# Patient Record
Sex: Male | Born: 1970 | Race: Black or African American | Hispanic: No | Marital: Married | Smoking: Current every day smoker
Health system: Southern US, Community
[De-identification: ages and names within clinical notes are randomized; demographics above are authoritative.]

## PROBLEM LIST (undated history)

## (undated) DIAGNOSIS — I10 Essential (primary) hypertension: Secondary | ICD-10-CM

## (undated) DIAGNOSIS — G0490 Encephalitis and encephalomyelitis, unspecified: Secondary | ICD-10-CM

## (undated) DIAGNOSIS — I219 Acute myocardial infarction, unspecified: Secondary | ICD-10-CM

## (undated) HISTORY — PX: CORONARY ANGIOPLASTY WITH STENT PLACEMENT: SHX49

---

## 2010-05-02 ENCOUNTER — Inpatient Hospital Stay: Payer: Self-pay | Admitting: Internal Medicine

## 2010-05-04 ENCOUNTER — Emergency Department: Payer: Self-pay | Admitting: Emergency Medicine

## 2011-03-23 ENCOUNTER — Emergency Department: Payer: Self-pay | Admitting: Emergency Medicine

## 2013-06-21 ENCOUNTER — Emergency Department: Payer: Self-pay | Admitting: Emergency Medicine

## 2013-06-21 LAB — PROTIME-INR
INR: 1
Prothrombin Time: 13.4 secs (ref 11.5–14.7)

## 2013-06-21 LAB — COMPREHENSIVE METABOLIC PANEL
Alkaline Phosphatase: 104 U/L (ref 50–136)
BUN: 17 mg/dL (ref 7–18)
Co2: 22 mmol/L (ref 21–32)
Creatinine: 1.33 mg/dL — ABNORMAL HIGH (ref 0.60–1.30)
EGFR (African American): >60
EGFR (Non-African Amer.): >60
Osmolality: 281 (ref 275–301)
SGPT (ALT): 24 U/L (ref 12–78)
Sodium: 139 mmol/L (ref 136–145)
Total Protein: 7.1 g/dL (ref 6.4–8.2)

## 2013-06-21 LAB — CBC
HCT: 40.5 % (ref 40.0–52.0)
Platelet: 340 10*3/uL (ref 150–440)
RBC: 4.4 10*6/uL (ref 4.40–5.90)
RDW: 13 % (ref 11.5–14.5)
WBC: 11.3 10*3/uL — ABNORMAL HIGH (ref 3.8–10.6)

## 2013-06-21 LAB — APTT: Activated PTT: 28.6 secs (ref 23.6–35.9)

## 2013-06-21 LAB — CK TOTAL AND CKMB (NOT AT ARMC)
CK, Total: 317 U/L — ABNORMAL HIGH (ref 35–232)
CK-MB: 4.4 ng/mL — ABNORMAL HIGH (ref 0.5–3.6)

## 2013-06-21 LAB — TROPONIN I: Troponin-I: 0.98 ng/mL — ABNORMAL HIGH

## 2013-06-22 DIAGNOSIS — I2510 Atherosclerotic heart disease of native coronary artery without angina pectoris: Secondary | ICD-10-CM | POA: Insufficient documentation

## 2014-01-27 DIAGNOSIS — I429 Cardiomyopathy, unspecified: Secondary | ICD-10-CM | POA: Insufficient documentation

## 2014-01-27 DIAGNOSIS — I428 Other cardiomyopathies: Secondary | ICD-10-CM | POA: Insufficient documentation

## 2014-03-07 ENCOUNTER — Observation Stay: Payer: Self-pay | Admitting: Internal Medicine

## 2014-03-07 LAB — URINALYSIS, COMPLETE
BILIRUBIN, UR: NEGATIVE
BLOOD: NEGATIVE
Bacteria: NONE SEEN
GLUCOSE, UR: NEGATIVE mg/dL (ref 0–75)
KETONE: NEGATIVE
Nitrite: NEGATIVE
PH: 5 (ref 4.5–8.0)
PROTEIN: NEGATIVE
RBC,UR: 1 /HPF (ref 0–5)
SPECIFIC GRAVITY: 1.013 (ref 1.003–1.030)
Squamous Epithelial: 2

## 2014-03-07 LAB — DRUG SCREEN, URINE
Amphetamines, Ur Screen: NEGATIVE (ref ?–1000)
BARBITURATES, UR SCREEN: NEGATIVE (ref ?–200)
Benzodiazepine, Ur Scrn: NEGATIVE (ref ?–200)
Cannabinoid 50 Ng, Ur ~~LOC~~: POSITIVE (ref ?–50)
Cocaine Metabolite,Ur ~~LOC~~: NEGATIVE (ref ?–300)
MDMA (Ecstasy)Ur Screen: NEGATIVE (ref ?–500)
Methadone, Ur Screen: NEGATIVE (ref ?–300)
OPIATE, UR SCREEN: NEGATIVE (ref ?–300)
Phencyclidine (PCP) Ur S: NEGATIVE (ref ?–25)
Tricyclic, Ur Screen: NEGATIVE (ref ?–1000)

## 2014-03-07 LAB — CBC
HCT: 37 % — ABNORMAL LOW (ref 40.0–52.0)
HGB: 12.6 g/dL — AB (ref 13.0–18.0)
MCH: 31.7 pg (ref 26.0–34.0)
MCHC: 34 g/dL (ref 32.0–36.0)
MCV: 94 fL (ref 80–100)
PLATELETS: 215 10*3/uL (ref 150–440)
RBC: 3.96 10*6/uL — ABNORMAL LOW (ref 4.40–5.90)
RDW: 13.2 % (ref 11.5–14.5)
WBC: 8.9 10*3/uL (ref 3.8–10.6)

## 2014-03-07 LAB — COMPREHENSIVE METABOLIC PANEL
ALBUMIN: 3.5 g/dL (ref 3.4–5.0)
ALT: 22 U/L (ref 12–78)
Alkaline Phosphatase: 93 U/L
Anion Gap: 11 (ref 7–16)
BUN: 10 mg/dL (ref 7–18)
Bilirubin,Total: 0.2 mg/dL (ref 0.2–1.0)
CHLORIDE: 112 mmol/L — AB (ref 98–107)
Calcium, Total: 8.4 mg/dL — ABNORMAL LOW (ref 8.5–10.1)
Co2: 19 mmol/L — ABNORMAL LOW (ref 21–32)
Creatinine: 1.28 mg/dL (ref 0.60–1.30)
EGFR (African American): >60
EGFR (Non-African Amer.): >60
Glucose: 124 mg/dL — ABNORMAL HIGH (ref 65–99)
Osmolality: 284 (ref 275–301)
Potassium: 3.1 mmol/L — ABNORMAL LOW (ref 3.5–5.1)
SGOT(AST): 14 U/L — ABNORMAL LOW (ref 15–37)
Sodium: 142 mmol/L (ref 136–145)
TOTAL PROTEIN: 6.9 g/dL (ref 6.4–8.2)

## 2014-03-07 LAB — TROPONIN I
TROPONIN-I: 0.04 ng/mL
TROPONIN-I: 0.04 ng/mL
Troponin-I: 0.05 ng/mL

## 2014-03-07 LAB — LIPID PANEL
Cholesterol: 161 mg/dL (ref 0–200)
HDL Cholesterol: 28 mg/dL — ABNORMAL LOW (ref 40–60)
Ldl Cholesterol, Calc: 102 mg/dL — ABNORMAL HIGH (ref 0–100)
TRIGLYCERIDES: 157 mg/dL (ref 0–200)
VLDL CHOLESTEROL, CALC: 31 mg/dL (ref 5–40)

## 2014-03-07 LAB — CK-MB
CK-MB: 4.1 ng/mL — AB (ref 0.5–3.6)
CK-MB: 4.2 ng/mL — AB (ref 0.5–3.6)
CK-MB: 3.9 ng/mL — AB (ref 0.5–3.6)

## 2014-04-29 ENCOUNTER — Emergency Department: Payer: Self-pay | Admitting: Emergency Medicine

## 2014-07-21 ENCOUNTER — Emergency Department: Payer: Self-pay | Admitting: Emergency Medicine

## 2014-08-02 ENCOUNTER — Inpatient Hospital Stay: Payer: Self-pay | Admitting: Internal Medicine

## 2014-08-02 LAB — URINALYSIS, COMPLETE
BILIRUBIN, UR: NEGATIVE
Bacteria: NONE SEEN
Blood: NEGATIVE
Glucose,UR: NEGATIVE mg/dL (ref 0–75)
Ketone: NEGATIVE
Leukocyte Esterase: NEGATIVE
Nitrite: NEGATIVE
PH: 5 (ref 4.5–8.0)
Protein: NEGATIVE
RBC,UR: <1 /HPF (ref 0–5)
SPECIFIC GRAVITY: 1.02 (ref 1.003–1.030)
SQUAMOUS EPITHELIAL: NONE SEEN
WBC UR: 3 /HPF (ref 0–5)

## 2014-08-02 LAB — DRUG SCREEN, URINE
AMPHETAMINES, UR SCREEN: NEGATIVE (ref ?–1000)
BARBITURATES, UR SCREEN: NEGATIVE (ref ?–200)
BENZODIAZEPINE, UR SCRN: NEGATIVE (ref ?–200)
CANNABINOID 50 NG, UR ~~LOC~~: POSITIVE (ref ?–50)
Cocaine Metabolite,Ur ~~LOC~~: POSITIVE (ref ?–300)
MDMA (ECSTASY) UR SCREEN: NEGATIVE (ref ?–500)
Methadone, Ur Screen: NEGATIVE (ref ?–300)
OPIATE, UR SCREEN: NEGATIVE (ref ?–300)
Phencyclidine (PCP) Ur S: NEGATIVE (ref ?–25)
TRICYCLIC, UR SCREEN: NEGATIVE (ref ?–1000)

## 2014-08-02 LAB — CK TOTAL AND CKMB (NOT AT ARMC)
CK, TOTAL: 332 U/L — AB
CK, Total: 384 U/L — ABNORMAL HIGH
CK-MB: 5.7 ng/mL — AB (ref 0.5–3.6)
CK-MB: 4.9 ng/mL — AB (ref 0.5–3.6)

## 2014-08-02 LAB — LIPID PANEL
CHOLESTEROL: 236 mg/dL — AB (ref 0–200)
HDL Cholesterol: 39 mg/dL — ABNORMAL LOW (ref 40–60)
LDL CHOLESTEROL, CALC: 147 mg/dL — AB (ref 0–100)
Triglycerides: 251 mg/dL — ABNORMAL HIGH (ref 0–200)
VLDL CHOLESTEROL, CALC: 50 mg/dL — AB (ref 5–40)

## 2014-08-02 LAB — TROPONIN I
TROPONIN-I: 0.02 ng/mL
Troponin-I: <0.02 ng/mL

## 2014-08-02 LAB — COMPREHENSIVE METABOLIC PANEL
ALK PHOS: 99 U/L
ANION GAP: 10 (ref 7–16)
Albumin: 4 g/dL (ref 3.4–5.0)
BILIRUBIN TOTAL: 0.4 mg/dL (ref 0.2–1.0)
BUN: 20 mg/dL — AB (ref 7–18)
CALCIUM: 8.7 mg/dL (ref 8.5–10.1)
Chloride: 109 mmol/L — ABNORMAL HIGH (ref 98–107)
Co2: 21 mmol/L (ref 21–32)
Creatinine: 1.56 mg/dL — ABNORMAL HIGH (ref 0.60–1.30)
GLUCOSE: 102 mg/dL — AB (ref 65–99)
OSMOLALITY: 282 (ref 275–301)
Potassium: 3.7 mmol/L (ref 3.5–5.1)
SGOT(AST): 17 U/L (ref 15–37)
SGPT (ALT): 29 U/L
SODIUM: 140 mmol/L (ref 136–145)
TOTAL PROTEIN: 7.6 g/dL (ref 6.4–8.2)

## 2014-08-02 LAB — CBC
HCT: 45.3 % (ref 40.0–52.0)
HGB: 14.9 g/dL (ref 13.0–18.0)
MCH: 31.5 pg (ref 26.0–34.0)
MCHC: 32.9 g/dL (ref 32.0–36.0)
MCV: 96 fL (ref 80–100)
Platelet: 239 10*3/uL (ref 150–440)
RBC: 4.72 10*6/uL (ref 4.40–5.90)
RDW: 13.5 % (ref 11.5–14.5)
WBC: 9.6 10*3/uL (ref 3.8–10.6)

## 2014-08-03 LAB — CBC WITH DIFFERENTIAL/PLATELET
BASOS PCT: 0.9 %
Basophil #: 0.1 10*3/uL (ref 0.0–0.1)
EOS ABS: 0.2 10*3/uL (ref 0.0–0.7)
Eosinophil %: 2.5 %
HCT: 41.4 % (ref 40.0–52.0)
HGB: 13.7 g/dL (ref 13.0–18.0)
LYMPHS ABS: 3.9 10*3/uL — AB (ref 1.0–3.6)
LYMPHS PCT: 42.3 %
MCH: 31.5 pg (ref 26.0–34.0)
MCHC: 33.1 g/dL (ref 32.0–36.0)
MCV: 95 fL (ref 80–100)
Monocyte #: 0.9 x10 3/mm (ref 0.2–1.0)
Monocyte %: 9.2 %
NEUTROS ABS: 4.2 10*3/uL (ref 1.4–6.5)
Neutrophil %: 45.1 %
Platelet: 214 10*3/uL (ref 150–440)
RBC: 4.36 10*6/uL — ABNORMAL LOW (ref 4.40–5.90)
RDW: 13.5 % (ref 11.5–14.5)
WBC: 9.3 10*3/uL (ref 3.8–10.6)

## 2014-08-03 LAB — COMPREHENSIVE METABOLIC PANEL
ALK PHOS: 83 U/L
ANION GAP: 5 — AB (ref 7–16)
Albumin: 3.3 g/dL — ABNORMAL LOW (ref 3.4–5.0)
BUN: 21 mg/dL — ABNORMAL HIGH (ref 7–18)
Bilirubin,Total: 0.6 mg/dL (ref 0.2–1.0)
CALCIUM: 8.4 mg/dL — AB (ref 8.5–10.1)
CHLORIDE: 110 mmol/L — AB (ref 98–107)
Co2: 27 mmol/L (ref 21–32)
Creatinine: 1.45 mg/dL — ABNORMAL HIGH (ref 0.60–1.30)
EGFR (African American): >60
GFR CALC NON AF AMER: 57 — AB
GLUCOSE: 108 mg/dL — AB (ref 65–99)
OSMOLALITY: 287 (ref 275–301)
POTASSIUM: 3.6 mmol/L (ref 3.5–5.1)
SGOT(AST): 20 U/L (ref 15–37)
SGPT (ALT): 23 U/L
SODIUM: 142 mmol/L (ref 136–145)
TOTAL PROTEIN: 6.5 g/dL (ref 6.4–8.2)

## 2014-08-03 LAB — CK-MB: CK-MB: 3.9 ng/mL — AB (ref 0.5–3.6)

## 2014-08-03 LAB — TROPONIN I: TROPONIN-I: 0.02 ng/mL

## 2014-10-12 ENCOUNTER — Emergency Department: Payer: Self-pay | Admitting: Emergency Medicine

## 2014-10-21 ENCOUNTER — Emergency Department: Payer: Self-pay | Admitting: Emergency Medicine

## 2014-11-02 ENCOUNTER — Emergency Department: Payer: Self-pay | Admitting: Emergency Medicine
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
## 2015-01-15 NOTE — Discharge Summary (Signed)
PATIENT NAME:  Duane Chen, STANCZAK MR#:  [id number] DATE OF BIRTH:  [birth year]  DATE OF ADMISSION:  08/02/2014 DATE OF DISCHARGE:  08/03/2014  ADMITTING PHYSICIAN: Katharina Caper, MD  DISCHARGING PHYSICIAN: Enid Baas, MD  PRIMARY CARE PHYSICIAN: Currently none.   CONSULTATIONS IN THE HOSPITAL: None  DISCHARGE DIAGNOSES:  1.  Malignant hypertension.  2.  Noncompliance with medications.  3.  Hyperlipidemia.  4.  Coronary artery disease, status post stents.  5.  Cocaine and marijuana abuse.  6.  Acute renal failure.   DISCHARGE HOME MEDICATIONS:  1.  Hydralazine 50 mg p.o. 3 times a day.  2.  Norvasc 5 mg p.o. daily.  3.  Aspirin 81 mg p.o. daily.  4.  Hydrochlorothiazide/lisinopril 12.5/20 mg 2 tablets once a day.  5.  Atorvastatin 80 mg p.o. daily.  6.  Imdur 60 mg p.o. daily.   DISCHARGE DIET: Low-sodium diet.   DISCHARGE ACTIVITY: As tolerated.   FOLLOWUP INSTRUCTIONS:  1.  PCP followup in 1-2 weeks.  2.  Follow up with [facility] for personality issues.   LABORATORIES AND IMAGING STUDIES PRIOR TO DISCHARGE: WBC 9.3, hemoglobin 13.7, hematocrit 41.4, platelet count 214,000. Sodium 142, potassium 3.6, chloride 110, bicarbonate 27, BUN 21, creatinine 1.45, glucose 108, and calcium of 8.4. ALT 23, AST 20, alkaline phosphatase 83, total bilirubin 0.6, albumin of 3.3. Troponin has remained negative. CK elevated at 300. Chest x-ray showing clear lung fields. No acute cardiopulmonary disease. Urinalysis negative for any infection. Urine toxicology screen positive for cocaine and marijuana. Lipid profile showing LDL 147, triglycerides 259, total cholesterol 236, HDL of 39.   BRIEF HOSPITAL COURSE: Mr. Vyas is a 44 year old African American male with a past medical history significant for hypertension, coronary artery disease, status post prior stents, ongoing cocaine and marijuana abuse, presents to the hospital secondary to headache. Noted to have a blood pressure  systolic in the 190s. 1.  Malignant hypertension. The patient has been noncompliant in taking his medications. Currently does not have a primary care provider. He started back on his medications, and he was supposed to be on: Imdur, hydralazine, and Zestoretic. His beta blocker has been discontinued due to his frequent use of cocaine. He is being discharged on amlodipine and the doses of the other medications have been adjusted. His blood pressure in the hospital has remained very well controlled. He was explained the reason for compliance, and he acknowledged. Now that he has insurance, and the medications are less expensive, he is willing to take them regularly. He is strongly counseled against cocaine abuse.  2.  Hyperlipidemia. LDL was elevated secondary to him being noncompliant with his statin, so he was restarted on his atorvastatin.  3.  Coronary artery disease, status post stents. He needs to be compliant with his medications. Avoid cocaine. Take an aspirin. Follow up as an outpatient.   His course has been otherwise uneventful in the hospital.   DISCHARGE CONDITION: Stable.  Note-  His wife did mention that the patient has anger management issues, and he was willing to participate with outpatient psychiatry, so [facility] information will be provided at the time of discharge.   TIME SPENT: 45 minutes    ____________________________ Enid Baas, MD rk:MT D: 08/03/2014 13:58:02 ET T: 08/03/2014 15:47:22 ET JOB#: [id number]  cc: Enid Baas, MD, <Dictator> Enid Baas MD ELECTRONICALLY SIGNED 08/14/2014 14:48

## 2015-01-15 NOTE — H&P (Signed)
PATIENT NAME:  Duane Chen, Duane Chen MR#:  [id number] DATE OF BIRTH:  September 27, 1970  DATE OF ADMISSION:  08/02/2014  PRIMARY CARE PHYSICIAN: None.   HISTORY OF PRESENT ILLNESS:  The patient is a 44 year old African American male with past medical history significant for history of coronary artery disease, history of hypertension, hyperlipidemia, suspected obstructive sleep apnea, who presented to the hospital with complaints of feeling nauseous.  Apparently the patient was at work and he started having nausea in the morning, he also had some headaches. He was noted to have elevated blood pressure and was sent to the Emergency Room for further evaluation. He denies any visual problems. Denies any chest pain. Denies any numbness or weakness in his upper or lower extremities. Denies any shortness of breath. He has been not taking his oral medications for at least 1 month. He has not seen his primary care physician for a long period of time. Hospitalist services were contacted for admission since the patient did not improve with labetalol IV as well as some Vasotec IV.   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 Past medical history is also significant for a stent placement, cardiac catheterization September 2014, stent placement in RCA as well as right femoral artery.    MEDICATIONS: None.  Apparently in the past the patient was on Zestoretic 20/12.5 two pills daily, Coreg 25 twice daily, Imdur 30 mg daily, spironolactone 25 mg p.o. daily, hydralazine 50 mg 3 times daily, and Lipitor 80 mg p.o.  daily.   PAST SURGICAL HISTORY: None.   ALLERGIES: No known drug allergies.   The patient and does not take any medications currently.   SOCIAL HISTORY: Used to smoke at least 1 pack a day, has smoked for 30 years, now he is trying to cut down to 1 cigarette a day. Alcohol intermittently socially. No illicit drugs recently.   FAMILY HISTORY: Coronary artery disease.   REVIEW OF SYSTEMS: Positive for feeling chills, having frequent snoring as well as apneic episodes according to the patient's wife. Admits to having daytime sleepiness as well as headaches in the morning and feeling not rested. Also some sinus congestion, poor smell from the nose according to the patient's wife, cough as well as wheezes intermittently, and shortness of breath. Admits to also intermittent chest pains in the past, nothing recently. Admits to palpitations intermittently, also feeling presyncopal. Also nausea and diarrhea for the past 4 days. Admits to having intermittent rectal bleeding. Admits to chill.  Denies any fevers. Denies any pains, weight loss or gain.  EYES: Denies any blurry vision, double vision, glaucoma, or cataracts.  EARS, NOSE, AND THROAT: Denies any tinnitus, allergies, epistaxis, sinus pain, dentures, difficulty swallowing.  RESPIRATORY: Denies any hemoptysis, asthma, COPD.   CARDIOVASCULAR: Denies orthopnea, edema. He has dyspnea on exertion.   GASTROINTESTINAL: Denies any vomiting, hematemesis, gastroesophageal reflux disease, change in bowel habits.  GENITOURINARY: Denies any dysuria, hematuria, frequency, incontinence.  ENDOCRINOLOGY: Denies any polydipsia, nocturia, thyroid problems, heat or cold intolerance or thirst.  HEMATOLOGIC: Denies anemia, easy bruising, bleeding, swollen glands.   SKIN: Denies any acne, rash, change in moles.  MUSCULOSKELETAL:  Denies arthritis, cramps, swelling.  NEUROLOGIC:  No numbness, epilepsy, or tremor.   PSYCHIATRIC:  Denies any anxiety, insomnia, or  depression.   PHYSICAL EXAMINATION:  VITAL SIGNS: On arrival to the hospital temperature was 98.4, pulse was 75, respiration was 16, blood pressure 192/131, oxygen saturation was 96% on room air.  GENERAL: This is well-developed, well-nourished African-American male in no significant distress, comfortable on the stretcher.  HEENT: His pupils equal, reactive to light. Extraocular muscles intact. No icterus or conjunctivitis. Has normal hearing. No pharyngeal erythema. Mucosa is moist.  NECK: No masses. Supple, nontender. Thyroid not enlarged. No adenopathy. No JVD or carotid bruits. Full range of motion.  LUNGS: Clear to auscultation. No rales. The patient does have some wheezing and extended expiratory phase, some labored inspirations especially whenever he moves around as well as increased effort to breathe, not in severe respiratory distress. However the patient does have some mild respiratory distress whenever he moves around.  CARDIOVASCULAR: S1, S2 appreciated. Rhythm is regular.  PMI is not lateralized. Chest is nontender to palpation.  1 + pedal pulses.  No lower extremity edema, calf tenderness, or cyanosis was noted.  ABDOMEN: Soft, nontender. Bowel sounds are present.  No hepatosplenomegaly or masses were noted.  RECTAL: Deferred.  MUSCLE STRENGTH: Able to move all extremities. No cyanosis, degenerative joint disease, or kyphosis. Gait was not tested.  SKIN: No evidence of any rashes, lesions, erythema, nodularity, or induration. It was warm and dry to palpation.  No adenopathy in the cervical region.  NEUROLOGIC: Cranial nerves grossly intact. Sensory is intact. No dysarthria or aphasia. The patient is alert, oriented to time, person, and place, cooperative. Memory is good.  PSYCHIATRIC: No significant confusion, agitation, or depression noted.   LABORATORY DATA:  BMP, BUN and creatinine were 20 and 1.56, glucose of 102, otherwise BMP was unremarkable. The patient's lipid panel was done,  LDL was found to be 147,  cholesterol level was 236, triglycerides 251, and HDL was 39. Liver enzymes were normal. Troponin was less than 0.02. White blood cell count was 9.6, hemoglobin 14.9, platelet count 239,000. Urinalysis, 3 white blood cells, less than 1 red blood cell, otherwise unremarkable.   RADIOLOGIC STUDIES: Chest x-ray portable single view 08/02/2014 showed no active disease. EKG revealed normal sinus rhythm at 71 beats per minute, normal axis, left ventricular hypertrophy according to EKG criteria, possible left atrial enlargement according to EKG criteria, nonspecific ST-T changes, no acute ST-T changes however were noted.   ASSESSMENT AND PLAN:  1.  Malignant essential hypertension. Admit the patient to the medical floor. Start him on labetalol orally as well as, as needed IV. We will also initiate the patient on Clonidine PO,  will continue hydralazine as well as Imdur. We will add also nitroglycerin topically. The patient will be off ACE inhibitor due to renal insufficiency just for now until his kidney function is better.  2.  Suspected obstructive sleep apnea. He needs sleep study to qualify him for CPAP as he has clinical signs of obstructive sleep apnea.  3.  Renal failure, acute. We will start the patient on IV fluids at a low rate, watching his blood pressure very closely.  4.  Hyperlipidemia. We will initiate the patient on Lipitor.   TIME SPENT: 50 minutes.    ____________________________ Katharina Caper, MD rv:bu D: 08/02/2014 20:14:37 ET T: 08/02/2014 20:47:13 ET JOB#: [id number]  cc: Katharina Caper, MD, <Dictator> Magaline Steinberg MD ELECTRONICALLY SIGNED 09/05/2014 16:54

## 2015-01-15 NOTE — H&P (Signed)
PATIENT NAME:  Duane Chen, EPP MR#:  [id number] DATE OF BIRTH:  11/27/1970  DATE OF ADMISSION:  03/06/2014  PRIMARY CARE PHYSICIAN:  None.   REFERRING PHYSICIAN:  Dr. Cyril Loosen.   CHIEF COMPLAINT:  Syncope.   HISTORY OF PRESENT ILLNESS:  Duane Chen is a 44 year old male with known history of coronary artery disease with 100% occlusion of two vessels per patient by recent heart catheterization in September 2014 at [facility].  Also has previous history of coronary artery disease requiring a stent placement to RCA and also to right femoral artery comes to the Emergency Department after having an episode of syncope.  The patient was at a party and his aunt was quite drunk.  The patient was trying to help her to get into the car.  As the patient was trying to lift her started to experience heart and shortness of breath.  The patient decided to go back into the house to rest; however, as the patient was walking had an episode of severe shortness of breath.  Per family, the patient almost passed out for a very brief time.  However, the patient regained consciousness back and stated that the patient was doing alright.  Did not have any chest pain, palpitations, nausea, vomiting, abdominal pain.  Concerning about the patient's history of coronary artery disease, the decision was made to bring the patient to the Emergency Department.  Work-up in the Emergency Department with EKG and cardiac enzymes were unremarkable.  The patient is found to have a potassium of 3.1, otherwise the rest of all the values are within normal limits.   PAST MEDICAL HISTORY: 1.  Coronary artery disease requiring stent placement.  2.  Hypertension.   PAST SURGICAL HISTORY:  None.   ALLERGIES:  No known drug allergies.   HOME MEDICATIONS:  None, currently not taking any medications.   SOCIAL HISTORY:  Continues to smoke 1 pack a day.  Drank alcohol tonight.  Denies using any illicit drugs recently.   FAMILY HISTORY:   Coronary artery disease.   REVIEW OF SYSTEMS:   CONSTITUTIONAL:  Denies any generalized weakness.  EYES:  No change in vision. EARS, NOSE, THROAT:  No change in hearing. RESPIRATORY:  No cough, shortness of breath.  CARDIOVASCULAR:  No chest pain, palpitations.  GASTROINTESTINAL:  No nausea, vomiting, abdominal pain.  GENITOURINARY:  No dysuria or hematuria.  HEMATOLOGIC:  No easy bruising or bleeding.  SKIN:  No rash or lesions.  ENDOCRINE:  No polyuria or polydipsia.  MUSCULOSKELETAL:  No joint pains and aches.  NEUROLOGIC:  No weakness or numbness in any part of the body.   PHYSICAL EXAMINATION: GENERAL:  This is a well-built, well-nourished, age-appropriate male lying down in the bed, not in distress.  VITAL SIGNS:  Temperature 98.2, pulse 68, blood pressure 143/93, respiratory rate of 20, oxygen saturation is 97% on room air.  HEENT:  Head normocephalic, atraumatic.  Eyes, no scleral icterus.  Conjunctivae normal.  Pupils equal and reactive.  Extraocular movements are intact.  Mucous membranes moist.  No pharyngeal erythema.  NECK:  Supple.  No lymphadenopathy.  No JVD.  No carotid bruit. CHEST:  No focal tenderness.   LUNGS:  Bilateral clear to auscultation.  HEART:  S1, S2 regular.  No murmurs are heard.  ABDOMEN:  Bowel sounds plus.  Soft, nontender, nondistended.  No hepatosplenomegaly.  EXTREMITIES:  No pedal edema.  Pulses 2+.  NEUROLOGIC:  The patient is alert, oriented to place, person, and time.  Cranial nerves II through XII intact.  Motor 5 by 5 in upper and lower extremities.  MUSCULOSKELETAL:  Good range of motion in all the extremities.   LABORATORY DATA:  CBC is completely within normal limits.  CMP:  Potassium 3.1, bicarb 19, the rest of all of the values are within normal limits.   Troponin 0.04.   Chest x-ray, one view portable:  No evidence of cardiopulmonary disease.   EKG, 12-lead:  Nonspecific ST-T wave abnormalities.   ASSESSMENT AND PLAN:  Duane Chen  is a 44 year old male with known history of coronary artery disease with 100% occlusions in multiple vessels, comes to the Emergency Department after having an episode of syncope which was preceded by chest pain and shortness of breath.  1.  Syncope:  This is a possibility of ventricular arrhythmias.  The other possibility of vasovagal as the patient was trying to lift the patient's aunt.  Admit the patient to the monitored bed.  Cycle cardiac enzymes x 3, if negative.  We will also obtain echocardiogram, carotid Dopplers, considering the patient's history of peripheral vascular disease.  2.  Hypertension:  Currently well controlled.  3.  Continued tobacco use.  Counseled with the patient.  4.  Continued alcohol use.  Counseled with the patient.  5.  Hypokalemia:  Replaced by mouth.  6.  Keep the patient on deep vein thrombosis prophylaxis with Lovenox.   TIME SPENT:  45 minutes.    ____________________________ Susa Griffins, MD pv:ea D: 03/07/2014 02:31:00 ET T: 03/07/2014 03:43:47 ET JOB#: [id number]  cc: Susa Griffins, MD, <Dictator> Susa Griffins MD ELECTRONICALLY SIGNED 03/10/2014 7:18

## 2015-01-15 NOTE — Consult Note (Signed)
PATIENT NAME:  Duane Chen, Duane Chen MR#:  [id number] DATE OF BIRTH:  1971-03-19  DATE OF CONSULTATION:  03/07/2014  CONSULTING PHYSICIAN:  Laurier Nancy, MD  INDICATION FOR CONSULTATION: Syncope.   HISTORY OF PRESENT ILLNESS: This is a 44 year old [place] American male with a past medical history of PCI in [place] after having a myocardial infarction six, seven years ago. Also had another MI in September last year. Was sent to [facility] and was found to have occluded right coronary artery. It is not clear he had stenting at [facility] or not. He now presents with an episode where he says that he was lifting his aunt, who was not feeling well and while he was lifting he just all of a sudden became very short of breath and diaphoretic and nearly passed out, but did not actually pass out. He had some tightness in the chest also associated with it.   PAST MEDICAL HISTORY: As mentioned in history of coronary artery disease status post stenting of the right coronary artery eight years ago in [place], and possibly had another episode with another episode where he passed out and had chest pain and was sent to [facility] and it is not clear what was done over there. He is denying any chest pain right now.   ALLERGIES: None.   MEDICATIONS: He takes no medication.   SOCIAL HISTORY: Continues to smoke 1 pack per day. Denies drug use or EtOH abuse.   FAMILY HISTORY: Positive for coronary artery disease.   PHYSICAL EXAMINATION: GENERAL: He is alert, oriented x 3, in no acute distress.  VITAL SIGNS: Vitals are stable.  NECK: No JVD.  LUNGS: Clear.  HEART: Regular rate and rhythm. Normal S1, S2. No audible murmur.  ABDOMEN: Soft, nontender, positive bowel sounds.  EXTREMITIES: No pedal edema.  NEUROLOGIC: Appears to be intact.   LABS/STUDIES: EKG shows normal sinus rhythm, 83 beats per minute, left atrium enlargement, old inferior wall myocardial infarction, ST depression, V2 to V3. His troponins actually been negative.,  CPK-MB was slightly elevated. His echocardiogram had moderate to severe left ventricular dysfunction, ejection fraction of 35% to 40% and his carotid Doppler was done which was also unremarkable.   ASSESSMENT AND PLAN: The patient has presyncope with history of significant coronary artery disease where he actually may have this due to elevated blood pressure. Advise adding hydralazine 50 b.i.d. He denies any real chest pain. Troponins are negative. There are no acute EKG changes. He had a cardiac catheterization recently at [facility]. However, he is noncompliant with medication. I advise restarting most of his medications and adding hydralazine and we will follow the patient with you.   Thank you very much for the referral.   ____________________________ Laurier Nancy, MD sak:sg D: 03/07/2014 11:38:58 ET T: 03/07/2014 12:11:30 ET JOB#: [id number]  cc: Laurier Nancy, MD, <Dictator> Laurier Nancy MD ELECTRONICALLY SIGNED 04/07/2014 9:20

## 2015-06-29 ENCOUNTER — Encounter: Payer: Self-pay | Admitting: Emergency Medicine

## 2015-06-29 ENCOUNTER — Emergency Department
Admission: EM | Admit: 2015-06-29 | Discharge: 2015-06-29 | Disposition: A | Discharge status: Home / Self Care | Payer: Self-pay | Attending: Emergency Medicine | Admitting: Emergency Medicine

## 2015-06-29 DIAGNOSIS — I10 Essential (primary) hypertension: Secondary | ICD-10-CM | POA: Insufficient documentation

## 2015-06-29 DIAGNOSIS — L739 Follicular disorder, unspecified: Secondary | ICD-10-CM | POA: Insufficient documentation

## 2015-06-29 DIAGNOSIS — L738 Other specified follicular disorders: Secondary | ICD-10-CM

## 2015-06-29 DIAGNOSIS — B9689 Other specified bacterial agents as the cause of diseases classified elsewhere: Secondary | ICD-10-CM | POA: Insufficient documentation

## 2015-06-29 DIAGNOSIS — Z720 Tobacco use: Secondary | ICD-10-CM | POA: Insufficient documentation

## 2015-06-29 HISTORY — DX: Essential (primary) hypertension: I10

## 2015-06-29 HISTORY — DX: Acute myocardial infarction, unspecified: I21.9

## 2015-06-29 MED ORDER — SULFAMETHOXAZOLE-TRIMETHOPRIM 800-160 MG PO TABS: 1.0000 | ORAL_TABLET | Freq: Two times a day (BID) | ORAL | Status: DC

## 2015-06-29 MED ORDER — TRAMADOL HCL 50 MG PO TABS: 50.0000 mg | ORAL_TABLET | Freq: Four times a day (QID) | ORAL | Status: DC | PRN

## 2015-06-29 NOTE — ED Notes (Signed)
AAOx3.  Skin warm and dry.  NAD 

## 2015-06-29 NOTE — ED Notes (Signed)
Per pt he began having blisters under her R arm in the armpit approx 5-6 days ago. Per pt they have progressively gotten worse over time, and the number has increased. Wife states that she began to notice an order starting today. Several small blisters noted to pt's R armpit at this time. No drainage or redness noted.

## 2015-06-29 NOTE — ED Provider Notes (Signed)
[facility] Emergency Department Provider Note  ____________________________________________  Time seen: Approximately 1:45 PM  I have reviewed the triage vital signs and the nursing notes.   HISTORY  Chief Complaint Abscess    HPI Duane BOYNE Sr. is a 44 y.o. male patient complain of redness and swelling right axillary area status post using a new sprayed deodorant. It is noticed that the left axillary area has no edema or erythema.Patient denies any discharge from the area but does complain of a foul smell. Patient denies any fever associated this complaint. Patient rates his pain discomfort as a 5/10. No palliative measures taken for this complaint.    Past Medical History  Diagnosis Date  . MI (myocardial infarction) (HCC)   . Hypertension     There are no active problems to display for this patient.   Past Surgical History  Procedure Laterality Date  . Coronary angioplasty with stent placement      No current outpatient prescriptions on file.  Allergies Review of patient's allergies indicates no known allergies.  History reviewed. No pertinent family history.  Social History Social History  Substance Use Topics  . Smoking status: Current Every Day Smoker -- 0.50 packs/day    Types: Cigarettes  . Smokeless tobacco: Never Used  . Alcohol Use: Yes     Comment: 6pk/week    Review of Systems Constitutional: No fever/chills Eyes: No visual changes. ENT: No sore throat. Cardiovascular: Denies chest pain. Respiratory: Denies shortness of breath. Gastrointestinal: No abdominal pain.  No nausea, no vomiting.  No diarrhea.  No constipation. Genitourinary: Negative for dysuria. Musculoskeletal: Negative for back pain. Skin: Rash right axillary area  Neurological: Negative for headaches, focal weakness or numbness. Endocrine:Hypertension 10-point ROS otherwise negative.  ____________________________________________   PHYSICAL  EXAM:  VITAL SIGNS: ED Triage Vitals  Enc Vitals Group     BP --      Pulse --      Resp --      Temp --      Temp src --      SpO2 --      Weight --      Height --      Head Cir --      Peak Flow --      Pain Score --      Pain Loc --      Pain Edu? --      Excl. in GC? --     Constitutional: Alert and oriented. Well appearing and in no acute distress. Eyes: Conjunctivae are normal. PERRL. EOMI. Head: Atraumatic. Nose: No congestion/rhinnorhea. Mouth/Throat: Mucous membranes are moist.  Oropharynx non-erythematous. Neck: No stridor.  No cervical spine tenderness to palpation. Hematological/Lymphatic/Immunilogical: No cervical lymphadenopathy. Cardiovascular: Normal rate, regular rhythm. Grossly normal heart sounds.  Good peripheral circulation. Respiratory: Normal respiratory effort.  No retractions. Lungs CTAB. Gastrointestinal: Soft and nontender. No distention. No abdominal bruits. No CVA tenderness. Genitourinary:  Musculoskeletal: No lower extremity tenderness nor edema.  No joint effusions. Neurologic:  Normal speech and language. No gross focal neurologic deficits are appreciated. No gait instability. Skin:  Right axillary area reveals papular lesion on erythematous base. No drainage at this time.  Psychiatric: Mood and affect are normal. Speech and behavior are normal.  ____________________________________________   LABS (all labs ordered are listed, but only abnormal results are displayed)  Labs Reviewed - No data to display ____________________________________________  EKG   ____________________________________________  RADIOLOGY   ____________________________________________   PROCEDURES  Procedure(s) performed: None  Critical Care performed: No  ____________________________________________   INITIAL IMPRESSION / ASSESSMENT AND PLAN / ED COURSE  Pertinent labs & imaging results that were available during my care of the patient were  reviewed by me and considered in my medical decision making (see chart for details).  Folliculitis right axillary area. Patient given advice on home care. Patient will start Bactrim and use anti-bacterial soap for cleaning. [facility] ____________________________________________   FINAL CLINICAL IMPRESSION(S) / ED DIAGNOSES  Final diagnoses:  Bacterial folliculitis      Joni Reining, PA-C 06/29/15 1403  Richardean Canal, MD 06/29/15 [id number]

## 2016-01-07 ENCOUNTER — Encounter: Payer: Self-pay | Admitting: Emergency Medicine

## 2016-01-07 ENCOUNTER — Inpatient Hospital Stay
Admission: EM | Admit: 2016-01-07 | Discharge: 2016-01-08 | DRG: 305 | Disposition: A | Discharge status: Home / Self Care | Payer: Medicaid Other | Attending: Internal Medicine | Admitting: Internal Medicine

## 2016-01-07 ENCOUNTER — Emergency Department: Payer: Medicaid Other

## 2016-01-07 DIAGNOSIS — Z79899 Other long term (current) drug therapy: Secondary | ICD-10-CM

## 2016-01-07 DIAGNOSIS — I160 Hypertensive urgency: Principal | ICD-10-CM | POA: Diagnosis present

## 2016-01-07 DIAGNOSIS — F17210 Nicotine dependence, cigarettes, uncomplicated: Secondary | ICD-10-CM | POA: Diagnosis present

## 2016-01-07 DIAGNOSIS — Z955 Presence of coronary angioplasty implant and graft: Secondary | ICD-10-CM | POA: Diagnosis not present

## 2016-01-07 DIAGNOSIS — E785 Hyperlipidemia, unspecified: Secondary | ICD-10-CM | POA: Diagnosis present

## 2016-01-07 DIAGNOSIS — I252 Old myocardial infarction: Secondary | ICD-10-CM

## 2016-01-07 DIAGNOSIS — I10 Essential (primary) hypertension: Secondary | ICD-10-CM | POA: Diagnosis present

## 2016-01-07 DIAGNOSIS — I2510 Atherosclerotic heart disease of native coronary artery without angina pectoris: Secondary | ICD-10-CM | POA: Diagnosis present

## 2016-01-07 DIAGNOSIS — I639 Cerebral infarction, unspecified: Secondary | ICD-10-CM | POA: Diagnosis present

## 2016-01-07 LAB — URINE DRUG SCREEN, QUALITATIVE (ARMC ONLY)
Amphetamines, Ur Screen: NOT DETECTED
BARBITURATES, UR SCREEN: NOT DETECTED
Benzodiazepine, Ur Scrn: NOT DETECTED
CANNABINOID 50 NG, UR ~~LOC~~: POSITIVE — AB
Cocaine Metabolite,Ur ~~LOC~~: NOT DETECTED
MDMA (Ecstasy)Ur Screen: NOT DETECTED
METHADONE SCREEN, URINE: NOT DETECTED
Opiate, Ur Screen: NOT DETECTED
Phencyclidine (PCP) Ur S: NOT DETECTED
TRICYCLIC, UR SCREEN: NOT DETECTED

## 2016-01-07 LAB — COMPREHENSIVE METABOLIC PANEL
ALT: 18 U/L (ref 17–63)
AST: 19 U/L (ref 15–41)
Albumin: 4.3 g/dL (ref 3.5–5.0)
Alkaline Phosphatase: 88 U/L (ref 38–126)
Anion gap: 6 (ref 5–15)
BILIRUBIN TOTAL: 0.6 mg/dL (ref 0.3–1.2)
BUN: 13 mg/dL (ref 6–20)
CHLORIDE: 111 mmol/L (ref 101–111)
CO2: 21 mmol/L — ABNORMAL LOW (ref 22–32)
CREATININE: 1.2 mg/dL (ref 0.61–1.24)
Calcium: 9.2 mg/dL (ref 8.9–10.3)
Glucose, Bld: 107 mg/dL — ABNORMAL HIGH (ref 65–99)
POTASSIUM: 4 mmol/L (ref 3.5–5.1)
Sodium: 138 mmol/L (ref 135–145)
TOTAL PROTEIN: 7.8 g/dL (ref 6.5–8.1)

## 2016-01-07 LAB — URINALYSIS COMPLETE WITH MICROSCOPIC (ARMC ONLY)
BILIRUBIN URINE: NEGATIVE
Bacteria, UA: NONE SEEN
GLUCOSE, UA: NEGATIVE mg/dL
KETONES UR: NEGATIVE mg/dL
LEUKOCYTES UA: NEGATIVE
NITRITE: NEGATIVE
Protein, ur: NEGATIVE mg/dL
SPECIFIC GRAVITY, URINE: 1.018 (ref 1.005–1.030)
pH: 5 (ref 5.0–8.0)

## 2016-01-07 LAB — CBC
HCT: 42.8 % (ref 40.0–52.0)
Hemoglobin: 14.7 g/dL (ref 13.0–18.0)
MCH: 31.1 pg (ref 26.0–34.0)
MCHC: 34.4 g/dL (ref 32.0–36.0)
MCV: 90.3 fL (ref 80.0–100.0)
PLATELETS: 244 10*3/uL (ref 150–440)
RBC: 4.74 MIL/uL (ref 4.40–5.90)
RDW: 13.6 % (ref 11.5–14.5)
WBC: 9 10*3/uL (ref 3.8–10.6)

## 2016-01-07 LAB — PROTIME-INR
INR: 1.05
PROTHROMBIN TIME: 13.9 s (ref 11.4–15.0)

## 2016-01-07 LAB — DIFFERENTIAL
BASOS ABS: 0.1 10*3/uL (ref 0–0.1)
BASOS PCT: 1 %
EOS ABS: 0.2 10*3/uL (ref 0–0.7)
Eosinophils Relative: 2 %
LYMPHS ABS: 2.8 10*3/uL (ref 1.0–3.6)
Lymphocytes Relative: 32 %
MONO ABS: 0.8 10*3/uL (ref 0.2–1.0)
MONOS PCT: 9 %
Neutro Abs: 5.2 10*3/uL (ref 1.4–6.5)
Neutrophils Relative %: 56 %

## 2016-01-07 LAB — ETHANOL

## 2016-01-07 LAB — VITAMIN B12: VITAMIN B 12: 268 pg/mL (ref 180–914)

## 2016-01-07 LAB — APTT: APTT: 28 s (ref 24–36)

## 2016-01-07 MED ORDER — HYDROCHLOROTHIAZIDE 25 MG PO TABS
25.0000 mg | ORAL_TABLET | Freq: Every day | ORAL | Status: DC
  Administered 2016-01-07 – 2016-01-08 (×2): 25 mg via ORAL
  Filled 2016-01-07 (×2): qty 1

## 2016-01-07 MED ORDER — ASPIRIN 300 MG RE SUPP: 300.0000 mg | Freq: Every day | RECTAL | Status: DC

## 2016-01-07 MED ORDER — NICOTINE 21 MG/24HR TD PT24
21.0000 mg | MEDICATED_PATCH | Freq: Every day | TRANSDERMAL | Status: DC
  Administered 2016-01-07 – 2016-01-08 (×2): 21 mg via TRANSDERMAL
  Filled 2016-01-07 (×2): qty 1

## 2016-01-07 MED ORDER — ASPIRIN 81 MG PO CHEW
324.0000 mg | CHEWABLE_TABLET | Freq: Once | ORAL | Status: AC
  Administered 2016-01-07: 324 mg via ORAL
  Filled 2016-01-07: qty 4

## 2016-01-07 MED ORDER — METOPROLOL TARTRATE 25 MG PO TABS
25.0000 mg | ORAL_TABLET | Freq: Two times a day (BID) | ORAL | Status: DC
  Administered 2016-01-07 – 2016-01-08 (×3): 25 mg via ORAL
  Filled 2016-01-07 (×3): qty 1

## 2016-01-07 MED ORDER — ENOXAPARIN SODIUM 40 MG/0.4ML ~~LOC~~ SOLN
40.0000 mg | SUBCUTANEOUS | Status: DC
  Administered 2016-01-07: 40 mg via SUBCUTANEOUS
  Filled 2016-01-07: qty 0.4

## 2016-01-07 MED ORDER — STROKE: EARLY STAGES OF RECOVERY BOOK
Freq: Once | Status: AC
  Administered 2016-01-07: 16:00:00

## 2016-01-07 MED ORDER — LABETALOL HCL 5 MG/ML IV SOLN
10.0000 mg | Freq: Once | INTRAVENOUS | Status: AC
  Administered 2016-01-07: 10 mg via INTRAVENOUS

## 2016-01-07 MED ORDER — CLONIDINE HCL 0.1 MG PO TABS
0.2000 mg | ORAL_TABLET | Freq: Three times a day (TID) | ORAL | Status: DC
  Administered 2016-01-07 – 2016-01-08 (×3): 0.2 mg via ORAL
  Filled 2016-01-07 (×4): qty 2

## 2016-01-07 MED ORDER — ASPIRIN 325 MG PO TABS
325.0000 mg | ORAL_TABLET | Freq: Every day | ORAL | Status: DC
  Administered 2016-01-08: 11:00:00 325 mg via ORAL
  Filled 2016-01-07: qty 1

## 2016-01-07 MED ORDER — NITROGLYCERIN 2 % TD OINT
0.5000 [in_us] | TOPICAL_OINTMENT | Freq: Four times a day (QID) | TRANSDERMAL | Status: DC
  Administered 2016-01-07 – 2016-01-08 (×3): 0.5 [in_us] via TOPICAL
  Filled 2016-01-07 (×4): qty 1

## 2016-01-07 MED ORDER — HYDRALAZINE HCL 20 MG/ML IJ SOLN
10.0000 mg | Freq: Four times a day (QID) | INTRAMUSCULAR | Status: DC | PRN
  Administered 2016-01-07: 10 mg via INTRAVENOUS
  Filled 2016-01-07: qty 1

## 2016-01-07 MED ORDER — LABETALOL HCL 5 MG/ML IV SOLN
INTRAVENOUS | Status: AC
  Administered 2016-01-07: 10 mg via INTRAVENOUS
  Filled 2016-01-07: qty 4

## 2016-01-07 NOTE — H&P (Addendum)
[facility] - [place] at [facility]   PATIENT NAME: Duane Chen    MR#:  [id number]  DATE OF BIRTH:  01/19/1971  DATE OF ADMISSION:  01/07/2016  PRIMARY CARE PHYSICIAN: No PCP Per Patient   REQUESTING/REFERRING PHYSICIAN: Dorothea Glassman MD  CHIEF COMPLAINT:   Chief Complaint  Patient presents with  . Tingling  . Code Stroke    HISTORY OF PRESENT ILLNESS: Duane Chen  is a 45 y.o. male with a known history of Hypertension, , coronary artery disease who presents to the emergency room complaining of tingling on the left side of his body including his left ear left arm and left chest and left leg. Patient reports that he is supposed to be taking blood pressure medications but has not took for 1year. Patient was noted to have diastolic blood pressure in the 130s. He also complains of some left leg weakness. He denies any chest pain or shortness of breath.      PAST MEDICAL HISTORY:   Past Medical History  Diagnosis Date  . MI (myocardial infarction) (HCC)   . Hypertension     PAST SURGICAL HISTORY: Past Surgical History  Procedure Laterality Date  . Coronary angioplasty with stent placement      SOCIAL HISTORY:  Social History  Substance Use Topics  . Smoking status: Current Every Day Smoker -- 0.50 packs/day    Types: Cigarettes  . Smokeless tobacco: Never Used  . Alcohol Use: Yes     Comment: 6pk/week    FAMILY HISTORY: History reviewed. No pertinent family history.  DRUG ALLERGIES: No Known Allergies  REVIEW OF SYSTEMS:   CONSTITUTIONAL: No fever, fatigue or weakness.  EYES: No blurred or double vision.  EARS, NOSE, AND THROAT: No tinnitus or ear pain.  RESPIRATORY: No cough, shortness of breath, wheezing or hemoptysis.  CARDIOVASCULAR: No chest pain, orthopnea, edema.  GASTROINTESTINAL: No nausea, vomiting, diarrhea or abdominal pain.  GENITOURINARY: No dysuria, hematuria.  ENDOCRINE: No polyuria, nocturia,  HEMATOLOGY: No  anemia, easy bruising or bleeding SKIN: No rash or lesion. MUSCULOSKELETAL: No joint pain or arthritis.   NEUROLOGIC: Tingling numbness sensation on the left side of the body  PSYCHIATRY: No anxiety or depression.   MEDICATIONS AT HOME:  Prior to Admission medications   Medication Sig Start Date End Date Taking? Authorizing Provider  aspirin EC 81 MG tablet Take 81 mg by mouth daily.   Yes Historical Provider, MD  sulfamethoxazole-trimethoprim (BACTRIM DS,SEPTRA DS) 800-160 MG tablet Take 1 tablet by mouth 2 (two) times daily. Patient not taking: Reported on 01/07/2016 06/29/15   Joni Reining, PA-C  traMADol (ULTRAM) 50 MG tablet Take 1 tablet (50 mg total) by mouth every 6 (six) hours as needed for moderate pain. 06/29/15   Joni Reining, PA-C      PHYSICAL EXAMINATION:   VITAL SIGNS: Blood pressure 198/139, pulse 71, temperature 98.8 F (37.1 C), temperature source Oral, resp. rate 20, height  (1.854 m), weight 117.028 kg (258 lb), SpO2 100 %.  GENERAL:  45 y.o.-year-old patient lying in the bed with no acute distress.  EYES: Pupils equal, round, reactive to light and accommodation. No scleral icterus. Extraocular muscles intact.  HEENT: Head atraumatic, normocephalic. Oropharynx and nasopharynx clear.  NECK:  Supple, no jugular venous distention. No thyroid enlargement, no tenderness.  LUNGS: Normal breath sounds bilaterally, no wheezing, rales,rhonchi or crepitation. No use of accessory muscles of respiration.  CARDIOVASCULAR: S1, S2 normal. No murmurs, rubs, or gallops.  ABDOMEN: Soft, nontender, nondistended. Bowel sounds present. No organomegaly or mass.  EXTREMITIES: No pedal edema, cyanosis, or clubbing.  NEUROLOGIC: Cranial nerves II through XII are intact. Muscle strength 4-5 in the left lower extremity. Sensation diminished in the left upper extremity left lower extremity Gait not checked.  PSYCHIATRIC: The patient is alert and oriented x 3.  SKIN: No obvious rash,  lesion, or ulcer.   LABORATORY PANEL:   CBC  Recent Labs Lab 01/07/16 1258  WBC 9.0  HGB 14.7  HCT 42.8  PLT 244  MCV 90.3  MCH 31.1  MCHC 34.4  RDW 13.6  LYMPHSABS 2.8  MONOABS 0.8  EOSABS 0.2  BASOSABS 0.1   ------------------------------------------------------------------------------------------------------------------  Chemistries   Recent Labs Lab 01/07/16 1258  NA 138  K 4.0  CL 111  CO2 21*  GLUCOSE 107*  BUN 13  CREATININE 1.20  CALCIUM 9.2  AST 19  ALT 18  ALKPHOS 88  BILITOT 0.6   ------------------------------------------------------------------------------------------------------------------ estimated creatinine clearance is 105.2 mL/min (by C-G formula based on Cr of 1.2). ------------------------------------------------------------------------------------------------------------------ No results for input(s): TSH, T4TOTAL, T3FREE, THYROIDAB in the last 72 hours.  Invalid input(s): FREET3   Coagulation profile  Recent Labs Lab 01/07/16 1258  INR 1.05   ------------------------------------------------------------------------------------------------------------------- No results for input(s): DDIMER in the last 72 hours. -------------------------------------------------------------------------------------------------------------------  Cardiac Enzymes No results for input(s): CKMB, TROPONINI, MYOGLOBIN in the last 168 hours.  Invalid input(s): CK ------------------------------------------------------------------------------------------------------------------ Invalid input(s): POCBNP  ---------------------------------------------------------------------------------------------------------------  Urinalysis    Component Value Date/Time   COLORURINE Yellow 08/02/2014 1526   APPEARANCEUR Clear 08/02/2014 1526   LABSPEC 1.020 08/02/2014 1526   PHURINE 5.0 08/02/2014 1526   GLUCOSEU Negative 08/02/2014 1526   HGBUR Negative  08/02/2014 1526   BILIRUBINUR Negative 08/02/2014 1526   KETONESUR Negative 08/02/2014 1526   PROTEINUR Negative 08/02/2014 1526   NITRITE Negative 08/02/2014 1526   LEUKOCYTESUR Negative 08/02/2014 1526     RADIOLOGY: Ct Head Wo Contrast  01/07/2016  CLINICAL DATA:  45 year old male with history of sudden onset of left-sided weakness and numbness at 7:30 a.m. this morning, progressively worsening throughout the day. Slurred speech. Code stroke. EXAM: CT HEAD WITHOUT CONTRAST TECHNIQUE: Contiguous axial images were obtained from the base of the skull through the vertex without intravenous contrast. COMPARISON:  No priors. FINDINGS: No acute intracranial abnormalities. Specifically, no evidence of acute intracranial hemorrhage, no definite findings of acute/subacute cerebral ischemia, no mass, mass effect, hydrocephalus or abnormal intra or extra-axial fluid collections. Left maxillary sinus is completely opacified with mucoperiosteal thickening and some expansion and medial sinus wall, suggesting an mucocele. Visualized paranasal sinuses and mastoids are otherwise well pneumatized. No acute displaced skull fractures are identified. IMPRESSION: 1. No acute intracranial abnormalities. 2. 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 Electronically Signed   By: Trudie Reed M.D.   On: 01/07/2016 12:52    EKG: Orders placed or performed during the hospital encounter of 01/07/16  . EKG 12-Lead  . EKG 12-Lead  . ED EKG  . ED EKG  . ED EKG  . ED EKG    IMPRESSION AND PLAN: Patient is a 45 year old African-American male with poorly controlled blood pressure medical noncompliance presents with numbness in the left side of his body  1. Acute CVA: Obtain MRI of the brain. We will obtain carotid Dopplers as well as MRA of the brain. As well as echocardiogram Aspirin daily, will check a fasting lipid panel in the a.m.  2. Accelerated hypertension his diastolic blood pressure is very  high we'll go  ahead and see give him a dose of labetalol. I will start him on metoprolol and HCTZ. Will use when necessary IV hydralazine  3. Cad - asa metoprolol  4. Nicotine abuse smoking cessitoin provided for 4 min, recommend to stop smoking  5. Misc: lovenx for dvt proph  All the records are reviewed and case discussed with ED provider. Management plans discussed with the patient, family and they are in agreement.  CODE STATUS:    Code Status Orders        Start     Ordered   01/07/16 1349  Full code   Continuous     01/07/16 1350    Code Status History    Date Active Date Inactive Code Status Order ID Comments User Context   This patient has a current code status but no historical code status.       TOTAL TIME TAKING CARE OF THIS PATIENT55 minutes.    Auburn Bilberry M.D on 01/07/2016 at 2:30 PM  Between 7am to 6pm - Pager - [phone]  After 6pm go to [email or web address] - password [email or web address] [facility]  [facility]  Office  [phone]  CC: Primary care physician; No PCP Per Patient

## 2016-01-07 NOTE — Progress Notes (Signed)
Called Dr Allena Katz about BP of 117/118 , per MD give nitro as scheduled and let other BP meds have more time to work, no new orders

## 2016-01-07 NOTE — ED Provider Notes (Signed)
[facility] Emergency Department Provider Note  ____________________________________________  Time seen: Approximately 12:53 PM  I have reviewed the triage vital signs and the nursing notes.   HISTORY  Chief Complaint Tingling and Code Stroke    HPI Duane CROTTY Sr. is a 45 y.o. male patient reports he got up this morning with the bathroom went back and laid down and then began to have tingling on the left side of his body left arm leg chest etc. He said he did that at about 8 his wife says he did that about 6:30 this morning. Wife reports that his left side is become progressively more weak and clumsy. Patient agrees with this. Patient is not had this problem before. Patient is able to walk and use his arm is and legs but the left side is slightly weaker than the right.   Past Medical History  Diagnosis Date  . MI (myocardial infarction) (HCC)   . Hypertension     There are no active problems to display for this patient.   Past Surgical History  Procedure Laterality Date  . Coronary angioplasty with stent placement      Current Outpatient Rx  Name  Route  Sig  Dispense  Refill  . sulfamethoxazole-trimethoprim (BACTRIM DS,SEPTRA DS) 800-160 MG tablet   Oral   Take 1 tablet by mouth 2 (two) times daily.   20 tablet   0   . traMADol (ULTRAM) 50 MG tablet   Oral   Take 1 tablet (50 mg total) by mouth every 6 (six) hours as needed for moderate pain.   12 tablet   0     Allergies Review of patient's allergies indicates no known allergies.  History reviewed. No pertinent family history.  Social History Social History  Substance Use Topics  . Smoking status: Current Every Day Smoker -- 0.50 packs/day    Types: Cigarettes  . Smokeless tobacco: Never Used  . Alcohol Use: Yes     Comment: 6pk/week    Review of Systems Constitutional: No fever/chills Eyes: No visual changes. ENT: No sore throat. Cardiovascular: Denies chest  pain. Respiratory: Denies shortness of breath. Gastrointestinal: No abdominal pain.  No nausea, no vomiting.  No diarrhea.  No constipation. Genitourinary: Negative for dysuria. Musculoskeletal: Negative for back pain. Skin: Negative for rash.  10-point ROS otherwise negative.  ____________________________________________   PHYSICAL EXAM:  VITAL SIGNS: ED Triage Vitals  Enc Vitals Group     BP 01/07/16 1245 190/130 mmHg     Pulse Rate 01/07/16 1245 82     Resp 01/07/16 1245 18     Temp 01/07/16 1245 98.8 F (37.1 C)     Temp Source 01/07/16 1245 Oral     SpO2 01/07/16 1245 100 %     Weight 01/07/16 1245 258 lb (117.028 kg)     Height 01/07/16 1245  (1.854 m)     Head Cir --      Peak Flow --      Pain Score --      Pain Loc --      Pain Edu? --      Excl. in GC? --    Constitutional: Alert and oriented. Well appearing and in no acute distress. Eyes: Conjunctivae are normal. PERRL. EOMI. Head: Atraumatic. Nose: No congestion/rhinnorhea. Mouth/Throat: Mucous membranes are moist.  Oropharynx non-erythematous. Neck: No stridor. Cardiovascular: Normal rate, regular rhythm. Grossly normal heart sounds.  Good peripheral circulation. Respiratory: Normal respiratory effort.  No retractions.  Lungs CTAB. Gastrointestinal: Soft and nontender. No distention. No abdominal bruits. No CVA tenderness. Musculoskeletal: No lower extremity tenderness nor edema.  No joint effusions. Neurologic:  Normal speech and language. Left side of the body is diffusely slightly weak and numb and tingly. There is no facial droop however. She has slight difficulty walking. Skin:  Skin is warm, dry and intact. No rash noted. Psychiatric: Mood and affect are normal. Speech and behavior are normal.  ____________________________________________   LABS (all labs ordered are listed, but only abnormal results are displayed)  Labs Reviewed  COMPREHENSIVE METABOLIC PANEL - Abnormal; Notable for the  following:    CO2 21 (*)    Glucose, Bld 107 (*)    All other components within normal limits  ETHANOL  PROTIME-INR  APTT  CBC  DIFFERENTIAL  URINE DRUG SCREEN, QUALITATIVE ([facility] ONLY)  URINALYSIS COMPLETEWITH MICROSCOPIC ([facility] ONLY)   ____________________________________________  EKG  EKG read and interpreted by me shows normal sinus rhythm at a rate of 82 normal axis there are Q waves inferiorly. There is some ST elevation in V2 and V3 but this is chronic and has been present on the last at least 3 EKGs. Unchanged. ____________________________________________  RADIOLOGY  CT of the head shows no acute intracranial pathology there is a mucocele on the sinus.  per radiology ____________________________________________   PROCEDURES  Code stroke was called as soon as I saw the patient after he got in the room.  ____________________________________________   INITIAL IMPRESSION / ASSESSMENT AND PLAN / ED COURSE  Pertinent labs & imaging results that were available during my care of the patient were reviewed by me and considered in my medical decision making (see chart for details).   ____________________________________________   FINAL CLINICAL IMPRESSION(S) / ED DIAGNOSES  Final diagnoses:  Cerebrovascular accident (CVA), unspecified mechanism (HCC)      Arnaldo Natal, MD 01/07/16 1344

## 2016-01-07 NOTE — ED Notes (Signed)
Awoke and went to the bathroom and states he was fine - started having left sided tingling, wife states slurred speech

## 2016-01-08 ENCOUNTER — Inpatient Hospital Stay: Payer: Medicaid Other

## 2016-01-08 ENCOUNTER — Inpatient Hospital Stay
Admit: 2016-01-08 | Discharge: 2016-01-08 | Disposition: A | Discharge status: Home / Self Care | Payer: Medicaid Other | Attending: Internal Medicine | Admitting: Internal Medicine

## 2016-01-08 LAB — ECHOCARDIOGRAM COMPLETE
HEIGHTINCHES: 73 in
Weight: 4286.4 oz

## 2016-01-08 LAB — LIPID PANEL
CHOL/HDL RATIO: 8.3 ratio
CHOLESTEROL: 217 mg/dL — AB (ref 0–200)
HDL: 26 mg/dL — AB (ref >40)
LDL Cholesterol: 126 mg/dL — ABNORMAL HIGH (ref 0–99)
Triglycerides: 324 mg/dL — ABNORMAL HIGH (ref <150)
VLDL: 65 mg/dL — ABNORMAL HIGH (ref 0–40)

## 2016-01-08 MED ORDER — HYDROCHLOROTHIAZIDE 25 MG PO TABS: 25.0000 mg | ORAL_TABLET | Freq: Every day | ORAL | Status: DC

## 2016-01-08 MED ORDER — METOPROLOL TARTRATE 25 MG PO TABS: 25.0000 mg | ORAL_TABLET | Freq: Two times a day (BID) | ORAL | Status: DC

## 2016-01-08 MED ORDER — HYDROCHLOROTHIAZIDE 25 MG PO TABS: 25.0000 mg | ORAL_TABLET | Freq: Every day | ORAL | Status: AC

## 2016-01-08 MED ORDER — METOPROLOL TARTRATE 25 MG PO TABS: 25.0000 mg | ORAL_TABLET | Freq: Two times a day (BID) | ORAL | Status: AC

## 2016-01-08 MED ORDER — ATORVASTATIN CALCIUM 40 MG PO TABS: 40.0000 mg | ORAL_TABLET | Freq: Every day | ORAL | Status: AC

## 2016-01-08 MED ORDER — ATORVASTATIN CALCIUM 20 MG PO TABS: 80.0000 mg | ORAL_TABLET | Freq: Every day | ORAL | Status: DC

## 2016-01-08 NOTE — Discharge Summary (Signed)
[facility] - [place] at [facility]   PATIENT NAME: Duane Chen    MR#:  [id number]  DATE OF BIRTH:  November 24, 1970  DATE OF ADMISSION:  01/07/2016 ADMITTING PHYSICIAN: Auburn Bilberry, MD  DATE OF DISCHARGE: 01/08/2016  PRIMARY CARE PHYSICIAN: No PCP Per Patient    ADMISSION DIAGNOSIS:  CVA (cerebral infarction) [I63.9] Cerebrovascular accident (CVA), unspecified mechanism (HCC) [I63.9]  DISCHARGE DIAGNOSIS:  cva ruled out Hypertensive urgency Hyperlipidemia with elevated ldl  SECONDARY DIAGNOSIS:   Past Medical History  Diagnosis Date  . MI (myocardial infarction) (HCC)   . Hypertension     HOSPITAL COURSE:  Duane Chen  is a 45 y.o. male admitted 01/07/2016 with chief complaint Tingling and Code Stroke . Please see H&P performed by Auburn Bilberry, MD for further information. His symptoms have improved/resolved. Stroke ruled out. Blood pressure better controlled in hospital - originally sbp>200   DISCHARGE CONDITIONS:   stable  CONSULTS OBTAINED:  Treatment Team:  Pauletta Browns, MD  DRUG ALLERGIES:  No Known Allergies  DISCHARGE MEDICATIONS:   Current Discharge Medication List    START taking these medications   Details  atorvastatin (LIPITOR) 40 MG tablet Take 1 tablet (40 mg total) by mouth daily at 6 PM. Qty: 30 tablet, Refills: 0    hydrochlorothiazide (HYDRODIURIL) 25 MG tablet Take 1 tablet (25 mg total) by mouth daily. Qty: 30 tablet, Refills: 0    metoprolol tartrate (LOPRESSOR) 25 MG tablet Take 1 tablet (25 mg total) by mouth 2 (two) times daily. Qty: 60 tablet, Refills: 0      CONTINUE these medications which have NOT CHANGED   Details  aspirin EC 81 MG tablet Take 81 mg by mouth daily.      STOP taking these medications     sulfamethoxazole-trimethoprim (BACTRIM DS,SEPTRA DS) 800-160 MG tablet      traMADol (ULTRAM) 50 MG tablet          DISCHARGE INSTRUCTIONS:    DIET:  Cardiac diet  DISCHARGE  CONDITION:  Good  ACTIVITY:  Activity as tolerated  OXYGEN:  Home Oxygen: No.   Oxygen Delivery: room air  DISCHARGE LOCATION:  home   If you experience worsening of your admission symptoms, develop shortness of breath, life threatening emergency, suicidal or homicidal thoughts you must seek medical attention immediately by calling 911 or calling your MD immediately  if symptoms less severe.  You Must read complete instructions/literature along with all the possible adverse reactions/side effects for all the Medicines you take and that have been prescribed to you. Take any new Medicines after you have completely understood and accpet all the possible adverse reactions/side effects.   Please note  You were cared for by a hospitalist during your hospital stay. If you have any questions about your discharge medications or the care you received while you were in the hospital after you are discharged, you can call the unit and asked to speak with the hospitalist on call if the hospitalist that took care of you is not available. Once you are discharged, your primary care physician will handle any further medical issues. Please note that NO REFILLS for any discharge medications will be authorized once you are discharged, as it is imperative that you return to your primary care physician (or establish a relationship with a primary care physician if you do not have one) for your aftercare needs so that they can reassess your need for medications and monitor your lab values.    On  the day of Discharge:   VITAL SIGNS:  Blood pressure 156/96, pulse 68, temperature 98.5 F (36.9 C), temperature source Oral, resp. rate 20, height  (1.854 m), weight 121.519 kg (267 lb 14.4 oz), SpO2 99 %.  I/O:   Intake/Output Summary (Last 24 hours) at 01/08/16 1241 Last data filed at 01/08/16 0800  Gross per 24 hour  Intake    480 ml  Output    700 ml  Net   -220 ml    PHYSICAL EXAMINATION:    GENERAL:  45 y.o.-year-old patient lying in the bed with no acute distress.  EYES: Pupils equal, round, reactive to light and accommodation. No scleral icterus. Extraocular muscles intact.  HEENT: Head atraumatic, normocephalic. Oropharynx and nasopharynx clear.  NECK:  Supple, no jugular venous distention. No thyroid enlargement, no tenderness.  LUNGS: Normal breath sounds bilaterally, no wheezing, rales,rhonchi or crepitation. No use of accessory muscles of respiration.  CARDIOVASCULAR: S1, S2 normal. No murmurs, rubs, or gallops.  ABDOMEN: Soft, non-tender, non-distended. Bowel sounds present. No organomegaly or mass.  EXTREMITIES: No pedal edema, cyanosis, or clubbing.  NEUROLOGIC: Cranial nerves II through XII are intact. Muscle strength 5/5 in all extremities. Sensation intact. Gait not checked.  PSYCHIATRIC: The patient is alert and oriented x 3.  SKIN: No obvious rash, lesion, or ulcer.   DATA REVIEW:   CBC  Recent Labs Lab 01/07/16 1258  WBC 9.0  HGB 14.7  HCT 42.8  PLT 244    Chemistries   Recent Labs Lab 01/07/16 1258  NA 138  K 4.0  CL 111  CO2 21*  GLUCOSE 107*  BUN 13  CREATININE 1.20  CALCIUM 9.2  AST 19  ALT 18  ALKPHOS 88  BILITOT 0.6    Cardiac Enzymes No results for input(s): TROPONINI in the last 168 hours.  Microbiology Results  No results found for this or any previous visit.  RADIOLOGY:  Ct Head Wo Contrast  01/07/2016  CLINICAL DATA:  45 year old male with history of sudden onset of left-sided weakness and numbness at 7:30 a.m. this morning, progressively worsening throughout the day. Slurred speech. Code stroke. EXAM: CT HEAD WITHOUT CONTRAST TECHNIQUE: Contiguous axial images were obtained from the base of the skull through the vertex without intravenous contrast. COMPARISON:  No priors. FINDINGS: No acute intracranial abnormalities. Specifically, no evidence of acute intracranial hemorrhage, no definite findings of acute/subacute  cerebral ischemia, no mass, mass effect, hydrocephalus or abnormal intra or extra-axial fluid collections. Left maxillary sinus is completely opacified with mucoperiosteal thickening and some expansion and medial sinus wall, suggesting an mucocele. Visualized paranasal sinuses and mastoids are otherwise well pneumatized. No acute displaced skull fractures are identified. IMPRESSION: 1. No acute intracranial abnormalities. 2. 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 Electronically Signed   By: Trudie Reed M.D.   On: 01/07/2016 12:52   US Carotid Bilateral  01/08/2016  CLINICAL DATA:  45 year old male with a history of cerebral vascular accident. Cardiovascular risk factors include hypertension, hyperlipidemia, tobacco use, known coronary artery disease EXAM: BILATERAL CAROTID DUPLEX ULTRASOUND TECHNIQUE: Wallace Cullens scale imaging, color Doppler and duplex ultrasound were performed of bilateral carotid and vertebral arteries in the neck. COMPARISON:  03/07/2014 FINDINGS: Criteria: Quantification of carotid stenosis is based on velocity parameters that correlate the residual internal carotid diameter with NASCET-based stenosis levels, using the diameter of the distal internal carotid lumen as the denominator for stenosis measurement. The following velocity measurements were obtained: RIGHT ICA:  Systolic 56 cm/sec, Diastolic  cm/sec CCA:  82 cm/sec SYSTOLIC ICA/CCA RATIO:  0.7 ECA:  101 cm/sec LEFT ICA:  Systolic 50 cm/sec, Diastolic  cm/sec CCA:  94 cm/sec SYSTOLIC ICA/CCA RATIO:  0.5 ECA:  75 cm/sec Right Brachial SBP: Not acquired Left Brachial SBP: Not acquired RIGHT CAROTID ARTERY: No significant calcified disease of the right common carotid artery. Intermediate waveform maintained. Heterogeneous plaque without significant calcifications at the right carotid bifurcation. Low resistance waveform of the right ICA. Tortuosity. RIGHT VERTEBRAL ARTERY: Antegrade flow with low resistance waveform. LEFT CAROTID ARTERY:  No significant calcified disease of the left common carotid artery. Intermediate waveform maintained. Heterogeneous plaque at the left carotid bifurcation without significant calcifications. Low resistance waveform of the left ICA. Tortuosity of the left system. LEFT VERTEBRAL ARTERY:  Antegrade flow with low resistance waveform. IMPRESSION: Color duplex indicates minimal heterogeneous plaque, with no hemodynamically significant stenosis by duplex criteria in the extracranial cerebrovascular circulation. Signed, Yvone Neu. Loreta Ave, DO [facility] Electronically Signed   By: Gilmer Mor D.O.   On: 01/08/2016 08:16     Management plans discussed with the patient, family and they are in agreement.  CODE STATUS:     Code Status Orders        Start     Ordered   01/07/16 1349  Full code   Continuous     01/07/16 1350    Code Status History    Date Active Date Inactive Code Status Order ID Comments User Context   This patient has a current code status but no historical code status.      TOTAL TIME TAKING CARE OF THIS PATIENT: 28 minutes.    Karey Suthers,  Mardi Mainland.D on 01/08/2016 at 12:41 PM  Between 7am to 6pm - Pager - [phone]  After 6pm go to [email or web address] - Scientist, research (life sciences) [facility]  Office  [phone]  CC: Primary care physician; No PCP Per Patient

## 2016-01-08 NOTE — Progress Notes (Signed)
Pt transported to US

## 2016-01-08 NOTE — Evaluation (Signed)
Physical Therapy Evaluation Patient Details Name: Duane BARRACO Sr. MRN: [id number] DOB: 01-Jun-1971 Today's Date: 01/08/2016   History of Present Illness  45 y.o. male with a known history of Hypertension, , coronary artery disease who presents to the emergency room complaining of tingling on the left side of his body including his left ear left arm and left chest and left leg. Patient reports that he is supposed to be taking blood pressure medications but has not took for 1 year.   Clinical Impression  Pt shows good strength and coordination.  He is able to ambulate w/o issue and has no balance or safety issues.  Pt reports some very minimal residual numbness in L hand but otherwise has no issues and is back to baseline.     Follow Up Recommendations No PT follow up    Equipment Recommendations       Recommendations for Other Services       Precautions / Restrictions Precautions Precautions:  (mod fall) Restrictions Weight Bearing Restrictions: No      Mobility  Bed Mobility Overal bed mobility: Independent                Transfers Overall transfer level: Independent                  Ambulation/Gait Ambulation/Gait assistance: Independent Ambulation Distance (Feet): 250 Feet Assistive device: None          Stairs Stairs: Yes Stairs assistance: Independent Stair Management: One rail Right;Alternating pattern Number of Stairs: 7 General stair comments: Pt does well with negotiating up/down steps.   Wheelchair Mobility    Modified Rankin (Stroke Patients Only)       Balance Overall balance assessment: Independent                                           Pertinent Vitals/Pain      Home Living Family/patient expects to be discharged to:: Private residence Living Arrangements: Spouse/significant other;Children Available Help at Discharge: Family   Home Access: Stairs to enter Entrance Stairs-Rails: Right Entrance  Stairs-Number of Steps: 3          Prior Function Level of Independence: Independent         Comments: Pt works (standing all day) and is able to do all he needs w/o issue.      Hand Dominance        Extremity/Trunk Assessment   Upper Extremity Assessment: Overall WFL for tasks assessed           Lower Extremity Assessment: Overall WFL for tasks assessed         Communication   Communication: No difficulties  Cognition Arousal/Alertness: Awake/alert Behavior During Therapy: WFL for tasks assessed/performed Overall Cognitive Status: Within Functional Limits for tasks assessed                      General Comments      Exercises        Assessment/Plan    PT Assessment Patent does not need any further PT services  PT Diagnosis Generalized weakness   PT Problem List    PT Treatment Interventions     PT Goals (Current goals can be found in the Care Plan section) Acute Rehab PT Goals Patient Stated Goal: Go home    Frequency     Barriers to discharge  Co-evaluation               End of Session   Activity Tolerance: Patient tolerated treatment well Patient left: in bed;with call bell/phone within reach Nurse Communication: Mobility status         Time: 9604-5409 PT Time Calculation (min) (ACUTE ONLY): 15 min   Charges:   PT Evaluation $PT Eval Low Complexity: 1 Procedure     PT G Codes:       Duane Chen, PT, DPT [phone]  Duane Chen 01/08/2016, 12:34 PM

## 2016-01-08 NOTE — Progress Notes (Signed)
*  PRELIMINARY RESULTS* Echocardiogram 2D Echocardiogram has been performed.  Duane Chen 01/08/2016, 8:59 AM

## 2016-01-08 NOTE — Progress Notes (Signed)
Discussed discharge instruction and medications with pt.  IVs removed.  No questions at this time.  Pt transported home via car by family.  Orson Ape, RN

## 2016-01-09 LAB — HEMOGLOBIN A1C: HEMOGLOBIN A1C: 6 % (ref 4.0–6.0)

## 2016-01-09 LAB — VITAMIN D 25 HYDROXY (VIT D DEFICIENCY, FRACTURES): Vit D, 25-Hydroxy: 9.5 ng/mL — ABNORMAL LOW (ref 30.0–100.0)

## 2016-01-12 ENCOUNTER — Inpatient Hospital Stay: Payer: Medicaid Other

## 2016-01-12 ENCOUNTER — Emergency Department: Payer: Medicaid Other

## 2016-01-12 ENCOUNTER — Encounter: Payer: Self-pay | Admitting: Emergency Medicine

## 2016-01-12 ENCOUNTER — Inpatient Hospital Stay
Admission: EM | Admit: 2016-01-12 | Discharge: 2016-01-19 | DRG: 207 | Disposition: A | Discharge status: Other Acute Inpatient Hospital | Payer: Medicaid Other | Attending: Internal Medicine | Admitting: Internal Medicine

## 2016-01-12 ENCOUNTER — Encounter
Admission: EM | Disposition: A | Discharge status: Other Acute Inpatient Hospital | Payer: Self-pay | Source: Home / Self Care | Attending: Internal Medicine

## 2016-01-12 DIAGNOSIS — J9600 Acute respiratory failure, unspecified whether with hypoxia or hypercapnia: Secondary | ICD-10-CM | POA: Insufficient documentation

## 2016-01-12 DIAGNOSIS — J189 Pneumonia, unspecified organism: Secondary | ICD-10-CM | POA: Diagnosis present

## 2016-01-12 DIAGNOSIS — J9690 Respiratory failure, unspecified, unspecified whether with hypoxia or hypercapnia: Secondary | ICD-10-CM | POA: Insufficient documentation

## 2016-01-12 DIAGNOSIS — R61 Generalized hyperhidrosis: Secondary | ICD-10-CM | POA: Diagnosis present

## 2016-01-12 DIAGNOSIS — R740 Nonspecific elevation of levels of transaminase and lactic acid dehydrogenase [LDH]: Secondary | ICD-10-CM | POA: Diagnosis present

## 2016-01-12 DIAGNOSIS — R7989 Other specified abnormal findings of blood chemistry: Secondary | ICD-10-CM | POA: Diagnosis present

## 2016-01-12 DIAGNOSIS — I252 Old myocardial infarction: Secondary | ICD-10-CM | POA: Diagnosis not present

## 2016-01-12 DIAGNOSIS — I674 Hypertensive encephalopathy: Secondary | ICD-10-CM | POA: Diagnosis present

## 2016-01-12 DIAGNOSIS — Z9114 Patient's other noncompliance with medication regimen: Secondary | ICD-10-CM | POA: Diagnosis not present

## 2016-01-12 DIAGNOSIS — Y900 Blood alcohol level of less than 20 mg/100 ml: Secondary | ICD-10-CM | POA: Diagnosis present

## 2016-01-12 DIAGNOSIS — Z955 Presence of coronary angioplasty implant and graft: Secondary | ICD-10-CM

## 2016-01-12 DIAGNOSIS — Y92009 Unspecified place in unspecified non-institutional (private) residence as the place of occurrence of the external cause: Secondary | ICD-10-CM | POA: Diagnosis not present

## 2016-01-12 DIAGNOSIS — J9601 Acute respiratory failure with hypoxia: Principal | ICD-10-CM | POA: Insufficient documentation

## 2016-01-12 DIAGNOSIS — R809 Proteinuria, unspecified: Secondary | ICD-10-CM | POA: Diagnosis present

## 2016-01-12 DIAGNOSIS — I214 Non-ST elevation (NSTEMI) myocardial infarction: Secondary | ICD-10-CM | POA: Diagnosis present

## 2016-01-12 DIAGNOSIS — Z79899 Other long term (current) drug therapy: Secondary | ICD-10-CM | POA: Diagnosis not present

## 2016-01-12 DIAGNOSIS — I10 Essential (primary) hypertension: Secondary | ICD-10-CM | POA: Diagnosis present

## 2016-01-12 DIAGNOSIS — I639 Cerebral infarction, unspecified: Secondary | ICD-10-CM | POA: Diagnosis present

## 2016-01-12 DIAGNOSIS — I161 Hypertensive emergency: Secondary | ICD-10-CM

## 2016-01-12 DIAGNOSIS — G0490 Encephalitis and encephalomyelitis, unspecified: Secondary | ICD-10-CM | POA: Insufficient documentation

## 2016-01-12 DIAGNOSIS — Z8673 Personal history of transient ischemic attack (TIA), and cerebral infarction without residual deficits: Secondary | ICD-10-CM

## 2016-01-12 DIAGNOSIS — W19XXXA Unspecified fall, initial encounter: Secondary | ICD-10-CM | POA: Diagnosis present

## 2016-01-12 DIAGNOSIS — F17210 Nicotine dependence, cigarettes, uncomplicated: Secondary | ICD-10-CM | POA: Diagnosis present

## 2016-01-12 DIAGNOSIS — R569 Unspecified convulsions: Secondary | ICD-10-CM | POA: Insufficient documentation

## 2016-01-12 DIAGNOSIS — R0902 Hypoxemia: Secondary | ICD-10-CM

## 2016-01-12 DIAGNOSIS — J9811 Atelectasis: Secondary | ICD-10-CM | POA: Diagnosis present

## 2016-01-12 DIAGNOSIS — I6340 Cerebral infarction due to embolism of unspecified cerebral artery: Secondary | ICD-10-CM | POA: Diagnosis not present

## 2016-01-12 DIAGNOSIS — Z820 Family history of epilepsy and other diseases of the nervous system: Secondary | ICD-10-CM

## 2016-01-12 DIAGNOSIS — A419 Sepsis, unspecified organism: Secondary | ICD-10-CM | POA: Diagnosis present

## 2016-01-12 DIAGNOSIS — E872 Acidosis: Secondary | ICD-10-CM | POA: Diagnosis present

## 2016-01-12 DIAGNOSIS — F1290 Cannabis use, unspecified, uncomplicated: Secondary | ICD-10-CM | POA: Diagnosis present

## 2016-01-12 DIAGNOSIS — D696 Thrombocytopenia, unspecified: Secondary | ICD-10-CM | POA: Diagnosis present

## 2016-01-12 DIAGNOSIS — I2510 Atherosclerotic heart disease of native coronary artery without angina pectoris: Secondary | ICD-10-CM | POA: Diagnosis present

## 2016-01-12 DIAGNOSIS — Z8249 Family history of ischemic heart disease and other diseases of the circulatory system: Secondary | ICD-10-CM | POA: Diagnosis not present

## 2016-01-12 DIAGNOSIS — E785 Hyperlipidemia, unspecified: Secondary | ICD-10-CM | POA: Diagnosis present

## 2016-01-12 DIAGNOSIS — R0600 Dyspnea, unspecified: Secondary | ICD-10-CM | POA: Diagnosis not present

## 2016-01-12 DIAGNOSIS — R4182 Altered mental status, unspecified: Secondary | ICD-10-CM | POA: Diagnosis not present

## 2016-01-12 DIAGNOSIS — R0603 Acute respiratory distress: Secondary | ICD-10-CM | POA: Diagnosis present

## 2016-01-12 DIAGNOSIS — Z95828 Presence of other vascular implants and grafts: Secondary | ICD-10-CM

## 2016-01-12 DIAGNOSIS — Z833 Family history of diabetes mellitus: Secondary | ICD-10-CM

## 2016-01-12 DIAGNOSIS — N179 Acute kidney failure, unspecified: Secondary | ICD-10-CM | POA: Diagnosis present

## 2016-01-12 HISTORY — DX: Encephalitis and encephalomyelitis, unspecified: G04.90

## 2016-01-12 HISTORY — PX: PERIPHERAL VASCULAR CATHETERIZATION: SHX172C

## 2016-01-12 LAB — CBC WITH DIFFERENTIAL/PLATELET
Basophils Absolute: 0.1 10*3/uL (ref 0–0.1)
Basophils Relative: 1 %
EOS PCT: 4 %
Eosinophils Absolute: 0.5 10*3/uL (ref 0–0.7)
HCT: 45.3 % (ref 40.0–52.0)
HEMOGLOBIN: 15.2 g/dL (ref 13.0–18.0)
LYMPHS ABS: 5 10*3/uL — AB (ref 1.0–3.6)
LYMPHS PCT: 41 %
MCH: 30.5 pg (ref 26.0–34.0)
MCHC: 33.5 g/dL (ref 32.0–36.0)
MCV: 91.1 fL (ref 80.0–100.0)
MONOS PCT: 13 %
Monocytes Absolute: 1.6 10*3/uL — ABNORMAL HIGH (ref 0.2–1.0)
Neutro Abs: 5.1 10*3/uL (ref 1.4–6.5)
Neutrophils Relative %: 41 %
PLATELETS: 317 10*3/uL (ref 150–440)
RBC: 4.97 MIL/uL (ref 4.40–5.90)
RDW: 13.8 % (ref 11.5–14.5)
WBC: 12.4 10*3/uL — AB (ref 3.8–10.6)

## 2016-01-12 LAB — URINALYSIS COMPLETE WITH MICROSCOPIC (ARMC ONLY)
Bilirubin Urine: NEGATIVE
Bilirubin Urine: NEGATIVE
GLUCOSE, UA: NEGATIVE mg/dL
Glucose, UA: >500 mg/dL — AB
KETONES UR: NEGATIVE mg/dL
Ketones, ur: NEGATIVE mg/dL
LEUKOCYTES UA: NEGATIVE
Leukocytes, UA: NEGATIVE
Nitrite: NEGATIVE
Nitrite: NEGATIVE
PH: 5 (ref 5.0–8.0)
PROTEIN: 30 mg/dL — AB
Specific Gravity, Urine: 1.033 — ABNORMAL HIGH (ref 1.005–1.030)
Specific Gravity, Urine: 1.019 (ref 1.005–1.030)
WBC, UA: NONE SEEN WBC/hpf (ref 0–5)
pH: 5 (ref 5.0–8.0)

## 2016-01-12 LAB — URINE DRUG SCREEN, QUALITATIVE (ARMC ONLY)
Amphetamines, Ur Screen: NOT DETECTED
Barbiturates, Ur Screen: NOT DETECTED
Benzodiazepine, Ur Scrn: POSITIVE — AB
CANNABINOID 50 NG, UR ~~LOC~~: POSITIVE — AB
COCAINE METABOLITE, UR ~~LOC~~: NOT DETECTED
MDMA (ECSTASY) UR SCREEN: NOT DETECTED
Methadone Scn, Ur: NOT DETECTED
Opiate, Ur Screen: NOT DETECTED
PHENCYCLIDINE (PCP) UR S: NOT DETECTED
Tricyclic, Ur Screen: NOT DETECTED

## 2016-01-12 LAB — BASIC METABOLIC PANEL
Anion gap: 9 (ref 5–15)
BUN: 17 mg/dL (ref 6–20)
CALCIUM: 9.4 mg/dL (ref 8.9–10.3)
CO2: 24 mmol/L (ref 22–32)
CREATININE: 1.27 mg/dL — AB (ref 0.61–1.24)
Chloride: 110 mmol/L (ref 101–111)
GFR calc Af Amer: >60 mL/min (ref >60)
GLUCOSE: 113 mg/dL — AB (ref 65–99)
POTASSIUM: 4.4 mmol/L (ref 3.5–5.1)
SODIUM: 143 mmol/L (ref 135–145)

## 2016-01-12 LAB — BLOOD GAS, ARTERIAL
ACID-BASE DEFICIT: 5.2 mmol/L — AB (ref 0.0–2.0)
ALLENS TEST (PASS/FAIL): POSITIVE — AB
Bicarbonate: 19.1 mEq/L — ABNORMAL LOW (ref 21.0–28.0)
FIO2: 0.5
Mechanical Rate: 18
O2 SAT: 99.3 %
PATIENT TEMPERATURE: 37
PCO2 ART: 33 mmHg (ref 32.0–48.0)
PEEP/CPAP: 5 cmH2O
PH ART: 7.37 (ref 7.350–7.450)
RATE: 18 resp/min
VT: 550 mL
pO2, Arterial: 153 mmHg — ABNORMAL HIGH (ref 83.0–108.0)

## 2016-01-12 LAB — COMPREHENSIVE METABOLIC PANEL
ALBUMIN: 4.5 g/dL (ref 3.5–5.0)
ALT: 27 U/L (ref 17–63)
AST: 28 U/L (ref 15–41)
Alkaline Phosphatase: 95 U/L (ref 38–126)
Anion gap: 11 (ref 5–15)
BUN: 17 mg/dL (ref 6–20)
CHLORIDE: 106 mmol/L (ref 101–111)
CO2: 24 mmol/L (ref 22–32)
Calcium: 9.9 mg/dL (ref 8.9–10.3)
Creatinine, Ser: 1.3 mg/dL — ABNORMAL HIGH (ref 0.61–1.24)
GFR calc Af Amer: >60 mL/min (ref >60)
Glucose, Bld: 196 mg/dL — ABNORMAL HIGH (ref 65–99)
POTASSIUM: 3.4 mmol/L — AB (ref 3.5–5.1)
SODIUM: 141 mmol/L (ref 135–145)
Total Bilirubin: 0.6 mg/dL (ref 0.3–1.2)
Total Protein: 8.3 g/dL — ABNORMAL HIGH (ref 6.5–8.1)

## 2016-01-12 LAB — HEMOGLOBIN A1C: Hgb A1c MFr Bld: 6.2 % — ABNORMAL HIGH (ref 4.0–6.0)

## 2016-01-12 LAB — LACTIC ACID, PLASMA
Lactic Acid, Venous: 5.5 mmol/L (ref 0.5–2.0)
Lactic Acid, Venous: 2.5 mmol/L (ref 0.5–2.0)

## 2016-01-12 LAB — PROTIME-INR
INR: 1.1
Prothrombin Time: 14.4 seconds (ref 11.4–15.0)

## 2016-01-12 LAB — APTT: APTT: 27 s (ref 24–36)

## 2016-01-12 LAB — TROPONIN I
Troponin I: 0.04 ng/mL — ABNORMAL HIGH (ref <0.031)
Troponin I: 0.06 ng/mL — ABNORMAL HIGH (ref <0.031)
Troponin I: 0.1 ng/mL — ABNORMAL HIGH (ref <0.031)

## 2016-01-12 LAB — GLUCOSE, CAPILLARY
Glucose-Capillary: 112 mg/dL — ABNORMAL HIGH (ref 65–99)
Glucose-Capillary: 125 mg/dL — ABNORMAL HIGH (ref 65–99)
Glucose-Capillary: 172 mg/dL — ABNORMAL HIGH (ref 65–99)

## 2016-01-12 LAB — TYPE AND SCREEN
ABO/RH(D): B POS
ANTIBODY SCREEN: NEGATIVE

## 2016-01-12 LAB — TRIGLYCERIDES: Triglycerides: 368 mg/dL — ABNORMAL HIGH (ref <150)

## 2016-01-12 LAB — PHENYTOIN LEVEL, TOTAL: PHENYTOIN LVL: 9.4 ug/mL — AB (ref 10.0–20.0)

## 2016-01-12 LAB — PROCALCITONIN

## 2016-01-12 LAB — TSH: TSH: 1.241 u[IU]/mL (ref 0.350–4.500)

## 2016-01-12 LAB — ETHANOL: Alcohol, Ethyl (B): <5 mg/dL (ref <5)

## 2016-01-12 LAB — MRSA PCR SCREENING: MRSA by PCR: POSITIVE — AB

## 2016-01-12 LAB — ABO/RH: ABO/RH(D): B POS

## 2016-01-12 SURGERY — FOREIGN BODY RETRIEVAL
Anesthesia: Moderate Sedation

## 2016-01-12 MED ORDER — PANTOPRAZOLE SODIUM 40 MG IV SOLR
40.0000 mg | INTRAVENOUS | Status: DC
  Administered 2016-01-12 – 2016-01-19 (×8): 40 mg via INTRAVENOUS
  Filled 2016-01-12 (×8): qty 40

## 2016-01-12 MED ORDER — LORAZEPAM 2 MG/ML IJ SOLN
1.0000 mg | Freq: Once | INTRAMUSCULAR | Status: AC
  Administered 2016-01-12: 1 mg via INTRAVENOUS

## 2016-01-12 MED ORDER — SODIUM CHLORIDE 0.9 % IV SOLN
INTRAVENOUS | Status: DC
  Administered 2016-01-12 – 2016-01-15 (×3): via INTRAVENOUS

## 2016-01-12 MED ORDER — LIDOCAINE-EPINEPHRINE (PF) 1 %-1:200000 IJ SOLN
INTRAMUSCULAR | Status: AC
  Filled 2016-01-12: qty 30

## 2016-01-12 MED ORDER — PRO-STAT SUGAR FREE PO LIQD
30.0000 mL | Freq: Two times a day (BID) | ORAL | Status: DC
  Administered 2016-01-13: 11:00:00
  Administered 2016-01-13 – 2016-01-17 (×10): 30 mL

## 2016-01-12 MED ORDER — FREE WATER
100.0000 mL | Freq: Three times a day (TID) | Status: DC
  Administered 2016-01-12 – 2016-01-19 (×18): 100 mL

## 2016-01-12 MED ORDER — FENTANYL BOLUS VIA INFUSION
50.0000 ug | INTRAVENOUS | Status: DC | PRN
  Administered 2016-01-12 – 2016-01-18 (×5): 50 ug via INTRAVENOUS
  Filled 2016-01-12: qty 50

## 2016-01-12 MED ORDER — PROPOFOL 1000 MG/100ML IV EMUL
5.0000 ug/kg/min | Freq: Once | INTRAVENOUS | Status: AC
  Administered 2016-01-12: 5 ug/kg/min via INTRAVENOUS

## 2016-01-12 MED ORDER — ONDANSETRON HCL 4 MG PO TABS: 4.0000 mg | ORAL_TABLET | Freq: Four times a day (QID) | ORAL | Status: DC | PRN

## 2016-01-12 MED ORDER — FENTANYL 2500MCG IN NS 250ML (10MCG/ML) PREMIX INFUSION
25.0000 ug/h | INTRAVENOUS | Status: DC
  Administered 2016-01-12: 50 ug/h via INTRAVENOUS
  Administered 2016-01-13: 75 ug/h via INTRAVENOUS
  Administered 2016-01-14: 250 ug/h via INTRAVENOUS
  Administered 2016-01-15: 125 ug/h via INTRAVENOUS
  Administered 2016-01-15 (×2): 50 ug/h via INTRAVENOUS
  Administered 2016-01-16 – 2016-01-18 (×4): 125 ug/h via INTRAVENOUS
  Administered 2016-01-19: 200 ug/h via INTRAVENOUS
  Administered 2016-01-19: 250 ug/h via INTRAVENOUS
  Filled 2016-01-12 (×12): qty 250

## 2016-01-12 MED ORDER — SODIUM CHLORIDE 0.9 % IV SOLN
1000.0000 mg | Freq: Once | INTRAVENOUS | Status: AC
  Administered 2016-01-12: 1000 mg via INTRAVENOUS
  Filled 2016-01-12: qty 20

## 2016-01-12 MED ORDER — SODIUM CHLORIDE 0.9 % IV BOLUS (SEPSIS)
1000.0000 mL | Freq: Once | INTRAVENOUS | Status: AC
  Administered 2016-01-12: 1000 mL via INTRAVENOUS

## 2016-01-12 MED ORDER — MIDAZOLAM HCL 2 MG/2ML IJ SOLN
2.0000 mg | Freq: Once | INTRAMUSCULAR | Status: AC
  Administered 2016-01-12: 2 mg via INTRAVENOUS

## 2016-01-12 MED ORDER — ETOMIDATE 2 MG/ML IV SOLN
20.0000 mg | Freq: Once | INTRAVENOUS | Status: AC
  Administered 2016-01-12: 20 mg via INTRAVENOUS

## 2016-01-12 MED ORDER — ANTISEPTIC ORAL RINSE SOLUTION (CORINZ)
7.0000 mL | Freq: Four times a day (QID) | OROMUCOSAL | Status: DC
  Administered 2016-01-12 – 2016-01-19 (×30): 7 mL via OROMUCOSAL
  Filled 2016-01-12 (×32): qty 7

## 2016-01-12 MED ORDER — MIDAZOLAM HCL 2 MG/2ML IJ SOLN
2.0000 mg | INTRAMUSCULAR | Status: AC | PRN
  Administered 2016-01-12 – 2016-01-19 (×3): 2 mg via INTRAVENOUS
  Filled 2016-01-12 (×3): qty 2

## 2016-01-12 MED ORDER — NICARDIPINE HCL IN NACL 20-0.86 MG/200ML-% IV SOLN: 3.0000 mg/h | Freq: Once | INTRAVENOUS | Status: DC

## 2016-01-12 MED ORDER — LABETALOL HCL 5 MG/ML IV SOLN
INTRAVENOUS | Status: AC
  Filled 2016-01-12: qty 4

## 2016-01-12 MED ORDER — LIDOCAINE HCL (CARDIAC) 20 MG/ML IV SOLN
100.0000 mg | Freq: Once | INTRAVENOUS | Status: AC
  Administered 2016-01-12: 100 mg via INTRAVENOUS

## 2016-01-12 MED ORDER — PHENYTOIN SODIUM 50 MG/ML IJ SOLN
100.0000 mg | Freq: Three times a day (TID) | INTRAMUSCULAR | Status: DC
  Administered 2016-01-12 – 2016-01-14 (×6): 100 mg via INTRAVENOUS
  Filled 2016-01-12 (×9): qty 2

## 2016-01-12 MED ORDER — ACETAMINOPHEN 325 MG PO TABS
650.0000 mg | ORAL_TABLET | Freq: Four times a day (QID) | ORAL | Status: DC | PRN
  Administered 2016-01-13 – 2016-01-18 (×9): 650 mg via ORAL
  Filled 2016-01-12 (×10): qty 2

## 2016-01-12 MED ORDER — CHLORHEXIDINE GLUCONATE 0.12% ORAL RINSE (MEDLINE KIT)
15.0000 mL | Freq: Two times a day (BID) | OROMUCOSAL | Status: DC
  Administered 2016-01-12 – 2016-01-19 (×16): 15 mL via OROMUCOSAL
  Filled 2016-01-12 (×17): qty 15

## 2016-01-12 MED ORDER — HYDRALAZINE HCL 20 MG/ML IJ SOLN
INTRAMUSCULAR | Status: DC | PRN
  Administered 2016-01-12: 20 mg via INTRAVENOUS

## 2016-01-12 MED ORDER — PHENYTOIN SODIUM 50 MG/ML IJ SOLN
100.0000 mg | Freq: Three times a day (TID) | INTRAMUSCULAR | Status: DC
  Filled 2016-01-12 (×2): qty 2

## 2016-01-12 MED ORDER — HEPARIN (PORCINE) IN NACL 2-0.9 UNIT/ML-% IJ SOLN
INTRAMUSCULAR | Status: AC
  Filled 2016-01-12: qty 500

## 2016-01-12 MED ORDER — HEPARIN (PORCINE) IN NACL 100-0.45 UNIT/ML-% IJ SOLN
1500.0000 [IU]/h | INTRAMUSCULAR | Status: DC
  Administered 2016-01-12: 1500 [IU]/h via INTRAVENOUS
  Filled 2016-01-12: qty 250

## 2016-01-12 MED ORDER — SODIUM CHLORIDE 0.9 % IV SOLN
1000.0000 mg | Freq: Once | INTRAVENOUS | Status: DC
  Filled 2016-01-12: qty 20

## 2016-01-12 MED ORDER — HEPARIN BOLUS VIA INFUSION
4000.0000 [IU] | Freq: Once | INTRAVENOUS | Status: AC
  Administered 2016-01-12: 4000 [IU] via INTRAVENOUS
  Filled 2016-01-12: qty 4000

## 2016-01-12 MED ORDER — VITAL HIGH PROTEIN PO LIQD
1000.0000 mL | ORAL | Status: DC
  Administered 2016-01-12 – 2016-01-15 (×3): 1000 mL

## 2016-01-12 MED ORDER — FENTANYL CITRATE (PF) 100 MCG/2ML IJ SOLN: 100.0000 ug | INTRAMUSCULAR | Status: DC | PRN

## 2016-01-12 MED ORDER — ATORVASTATIN CALCIUM 20 MG PO TABS
40.0000 mg | ORAL_TABLET | Freq: Every day | ORAL | Status: DC
  Administered 2016-01-12 – 2016-01-15 (×4): 40 mg
  Filled 2016-01-12 (×5): qty 2

## 2016-01-12 MED ORDER — ACETAMINOPHEN 650 MG RE SUPP
650.0000 mg | Freq: Four times a day (QID) | RECTAL | Status: DC | PRN
  Administered 2016-01-16 – 2016-01-17 (×3): 650 mg via RECTAL
  Filled 2016-01-12 (×3): qty 1

## 2016-01-12 MED ORDER — LABETALOL HCL 5 MG/ML IV SOLN
INTRAVENOUS | Status: DC | PRN
  Administered 2016-01-12: 10 mg via INTRAVENOUS

## 2016-01-12 MED ORDER — MIDAZOLAM HCL 2 MG/2ML IJ SOLN: 2.0000 mg | INTRAMUSCULAR | Status: DC | PRN

## 2016-01-12 MED ORDER — SODIUM CHLORIDE 0.9 % IV SOLN: 1000.0000 mg | Freq: Once | INTRAVENOUS | Status: DC

## 2016-01-12 MED ORDER — PROPOFOL 1000 MG/100ML IV EMUL
INTRAVENOUS | Status: AC
  Administered 2016-01-12: 5 ug/kg/min via INTRAVENOUS
  Filled 2016-01-12: qty 100

## 2016-01-12 MED ORDER — ASPIRIN EC 81 MG PO TBEC
81.0000 mg | DELAYED_RELEASE_TABLET | Freq: Every day | ORAL | Status: DC
  Administered 2016-01-12 – 2016-01-17 (×6): 81 mg via ORAL
  Filled 2016-01-12 (×6): qty 1

## 2016-01-12 MED ORDER — HYDRALAZINE HCL 20 MG/ML IJ SOLN
INTRAMUSCULAR | Status: AC
  Filled 2016-01-12: qty 1

## 2016-01-12 MED ORDER — ENOXAPARIN SODIUM 40 MG/0.4ML ~~LOC~~ SOLN: 40.0000 mg | SUBCUTANEOUS | Status: DC

## 2016-01-12 MED ORDER — FENTANYL CITRATE (PF) 100 MCG/2ML IJ SOLN
50.0000 ug | Freq: Once | INTRAMUSCULAR | Status: AC
  Administered 2016-01-12: 50 ug via INTRAVENOUS

## 2016-01-12 MED ORDER — PROPOFOL 1000 MG/100ML IV EMUL
5.0000 ug/kg/min | INTRAVENOUS | Status: DC
  Administered 2016-01-12: 20 ug/kg/min via INTRAVENOUS
  Filled 2016-01-12: qty 100

## 2016-01-12 MED ORDER — LORAZEPAM 2 MG/ML IJ SOLN
INTRAMUSCULAR | Status: AC
  Administered 2016-01-12: 1 mg via INTRAVENOUS
  Filled 2016-01-12: qty 1

## 2016-01-12 MED ORDER — MIDAZOLAM HCL 2 MG/2ML IJ SOLN
2.0000 mg | INTRAMUSCULAR | Status: DC | PRN
  Administered 2016-01-12 – 2016-01-16 (×4): 2 mg via INTRAVENOUS
  Filled 2016-01-12 (×5): qty 2

## 2016-01-12 MED ORDER — SUCCINYLCHOLINE CHLORIDE 20 MG/ML IJ SOLN
200.0000 mg | Freq: Once | INTRAMUSCULAR | Status: AC
  Administered 2016-01-12: 200 mg via INTRAVENOUS

## 2016-01-12 MED ORDER — ONDANSETRON HCL 4 MG/2ML IJ SOLN: 4.0000 mg | Freq: Four times a day (QID) | INTRAMUSCULAR | Status: DC | PRN

## 2016-01-12 SURGICAL SUPPLY — 9 items
CANNULA 5F STIFF (CANNULA) ×3 IMPLANT
CATH 5F KA2 (CATHETERS) ×3 IMPLANT
DEVICE ENSNARE  12MMX20MM (VASCULAR PRODUCTS) ×2
DEVICE ENSNARE 12MMX20MM (VASCULAR PRODUCTS) ×1 IMPLANT
KIT CATH CVC 3 LUMEN 7FR 8IN (MISCELLANEOUS) ×3 IMPLANT
KIT SNARE 25MM LOOP 120CM 6F (VASCULAR PRODUCTS) ×3 IMPLANT
PACK ANGIOGRAPHY (CUSTOM PROCEDURE TRAY) ×3 IMPLANT
SHEATH BRITE TIP 6FRX11 (SHEATH) ×6 IMPLANT
WIRE J 3MM .035X145CM (WIRE) ×3 IMPLANT

## 2016-01-12 NOTE — ED Notes (Signed)
Patient transported to CT 

## 2016-01-12 NOTE — Procedures (Signed)
  Procedure Note: [facility] Venous Catheter Placement  CLARANCE THAGGARD Sr. , [id number] , [id number]  Indications: Hemodynamic monitoring / Intravenous access  Benefits, risks (including bleeding, infection,  Injury, etc.), and alternatives explained to mother who voiced understanding.  Questions were sought and answered.   Mother agreed to proceed with the procedure.  Consent is signed and on chart. A time-out was completed verifying correct patient, procedure and site.  A 3 lumen catheter available at the time of procedure.  The patient was placed in a dependent position appropriate for central line placement based on the vein to be cannulated.   The patient's RIGHT Internal Jugular Vein was prepped and draped in a sterile fashion.  1% Lidocaine WAS NOT used to anesthetize the surrounding skin area.   A 3 lumen catheter was introduced into the RIGHT Internal Jugular Vein using Seldinger technique, visualized under ultrasound.  The catheter was threaded smoothly over the guide wire and appropriate blood return was obtained.  Each lumen of the catheter was evacuated of air and flushed with sterile saline.  The catheter was then sutured in place to the skin and a sterile dressing applied.  Perfusion to the extremity distal to the point of catheter insertion was checked and found to be adequate.    NP Bincy Varughese performed procedure as stated above, with direct supervision by Dr. Dema Severin.    Chest X-ray was ordered for confirmation of placement.  The patient tolerated the procedure well and there were no complications.  Stephanie Acre, MD [place] [facility] Pager [phone] (please enter 7-digits) On Call Pager - [phone] (please enter 7-digits)

## 2016-01-12 NOTE — ED Notes (Signed)
Pt arrived via EMS from home with c/o of Code Stroke sx. Wife reports accompanying sx occurring since yesterday in AM with visual disturbances. EMS reports wife found pt on sofa unresponsive with notable left side facial droop. Pt arrived unresponsive. MD at bedside.

## 2016-01-12 NOTE — H&P (Signed)
PULMONARY / CRITICAL CARE MEDICINE   Name: Duane VITELLI Sr. MRN: [id number] DOB: September 13, 1971    ADMISSION DATE:  01/12/2016 CONSULTATION DATE:  01/12/16  REFERRING MD :  Dr. Marcille Blanco   CHIEF COMPLAINT:      unresponsiveness  HISTORY OF PRESENT ILLNESS    45 -year-old male past medical history of myocardial infarction, hypertension, coronary angioplasty with stent placement, recent admission for hypertensive urgency/acute cva, and it for unresponsiveness. History per chart review and family members at bedside, niece and mother. Chart review EMS was called late last night after patient awoke from sleep, walked a few steps then fell over became unresponsive and hit his head, upon arrival EMS did not have to perform any CPR. In the emergency department the patient appeared to have a posturing event of his extremities and loss of urine, thought to be seizure episode, given that he was posturing for less than a minute or so, he was intubated for airway protection. Head CT was negative for stroke or bleed. He was also noted to have EKG changes, and was started on heparin drip. Off note, he was admitted earlier this week for hypertensive urgency and possible TIA. Mother states that after discharge he could not fill his prescription due to financial reasons. Per chart review wife stated that they had both been drinking alcohol and smoking marijuana before accidental fall.    SIGNIFICANT EVENTS   4/15 -Admitted for tingling, code stroke, and plaints of continuing on the left side of his body including his left ear and left chest and left leg noted to have diastolic blood pressure in the 130s, negative head CT.   4/19 - fall, unresponsive.  Drinking and smoking marijuana prior to fall, unresponsive, had possible seizure activity in the ER, intubated for airway protection and unresponsiveness.   PAST MEDICAL HISTORY    :  Past Medical History  Diagnosis Date  . MI (myocardial infarction) ([place])    . Hypertension    Past Surgical History  Procedure Laterality Date  . Coronary angioplasty with stent placement     Prior to Admission medications   Medication Sig Start Date End Date Taking? Authorizing Provider  aspirin EC 81 MG tablet Take 81 mg by mouth daily.   Yes Historical Provider, MD  atorvastatin (LIPITOR) 40 MG tablet Take 1 tablet (40 mg total) by mouth daily at 6 PM. 01/08/16  Yes Lytle Butte, MD  hydrochlorothiazide (HYDRODIURIL) 25 MG tablet Take 1 tablet (25 mg total) by mouth daily. 01/08/16  Yes Lytle Butte, MD  metoprolol tartrate (LOPRESSOR) 25 MG tablet Take 1 tablet (25 mg total) by mouth 2 (two) times daily. 01/08/16  Yes Lytle Butte, MD   No Known Allergies   FAMILY HISTORY   Family History  Problem Relation Age of Onset  . Seizures Sister   . Diabetes Mellitus II Mother   . Heart disease Father       SOCIAL HISTORY    reports that he has been smoking Cigarettes.  He has been smoking about 0.50 packs per day. He has never used smokeless tobacco. He reports that he drinks alcohol. He reports that he uses illicit drugs (Marijuana) about 7 times per week.  Review of Systems  Unable to perform ROS: intubated      VITAL SIGNS    Temp:  [99.3 F (37.4 C)-99.5 F (37.5 C)] 99.4 F (37.4 C) (04/20 1130) Pulse Rate:  [65-93] 75 (04/20 1200) Resp:  [18-42] 26 (04/20  1130) BP: (116-202)/(64-154) 138/92 mmHg (04/20 1200) SpO2:  [96 %-100 %] 100 % (04/20 1200) FiO2 (%):  [50 %] 50 % (04/20 1200) Weight:  [277 lb 12.5 oz (126 kg)] 277 lb 12.5 oz (126 kg) (04/20 0129) HEMODYNAMICS:   VENTILATOR SETTINGS: Vent Mode:  [-] PRVC FiO2 (%):  [50 %] 50 % Set Rate:  [18 bmp] 18 bmp Vt Set:  [550 mL] 550 mL PEEP:  [5 cmH20] 5 cmH20 INTAKE / OUTPUT:  Intake/Output Summary (Last 24 hours) at 01/12/16 1319 Last data filed at 01/12/16 1100  Gross per 24 hour  Intake 770.69 ml  Output    450 ml  Net 320.69 ml       PHYSICAL EXAM   Physical  Exam  Constitutional: He appears well-developed and well-nourished.  HENT:  Head: Normocephalic and atraumatic.  Right Ear: External ear normal.  Left Ear: External ear normal.  Nose: Nose normal.  Mouth/Throat: Oropharynx is clear and moist.  Eyes:  Sluggish pupils  Neck: Neck supple. No thyromegaly present.  Cardiovascular: Normal rate, regular rhythm, normal heart sounds and intact distal pulses.   No murmur heard. Pulmonary/Chest: He has no wheezes. He has no rales.  Intubated on MV, coarse upper airway sounds.   Abdominal: Soft. Bowel sounds are normal. He exhibits no distension.  Musculoskeletal: He exhibits no edema.  Neurological:  sedated  Skin: He is diaphoretic.       LABS   LABS:  CBC  Recent Labs Lab 01/07/16 1258 01/12/16 0134  WBC 9.0 12.4*  HGB 14.7 15.2  HCT 42.8 45.3  PLT 244 317   Coag's  Recent Labs Lab 01/07/16 1258 01/12/16 0134  APTT 28 27  INR 1.05 1.10   BMET  Recent Labs Lab 01/07/16 1258 01/12/16 0134  NA 138 141  K 4.0 3.4*  CL 111 106  CO2 21* 24  BUN 13 17  CREATININE 1.20 1.30*  GLUCOSE 107* 196*   Electrolytes  Recent Labs Lab 01/07/16 1258 01/12/16 0134  CALCIUM 9.2 9.9   Sepsis Markers  Recent Labs Lab 01/12/16 0141 01/12/16 0551 01/12/16 0807  LATICACIDVEN 5.5* 2.5*  --   PROCALCITON  --   --  <0.10   ABG  Recent Labs Lab 01/12/16 0236  PHART 7.37  PCO2ART 33  PO2ART 153*   Liver Enzymes  Recent Labs Lab 01/07/16 1258 01/12/16 0134  AST 19 28  ALT 18 27  ALKPHOS 88 95  BILITOT 0.6 0.6  ALBUMIN 4.3 4.5   Cardiac Enzymes  Recent Labs Lab 01/12/16 0134 01/12/16 0551  TROPONINI <0.03 0.04*   Glucose  Recent Labs Lab 01/12/16 0539  GLUCAP 172*     Recent Results (from the past 240 hour(s))  MRSA PCR Screening     Status: Abnormal   Collection Time: 01/12/16  6:01 AM  Result Value Ref Range Status   MRSA by PCR POSITIVE (A) NEGATIVE Final    Comment:        The  GeneXpert MRSA Assay (FDA approved for NASAL specimens only), is one component of a comprehensive MRSA colonization surveillance program. It is not intended to diagnose MRSA infection nor to guide or monitor treatment for MRSA infections. CRITICAL RESULT CALLED TO, READ BACK BY AND VERIFIED WITH: PAM MYERS 01/12/16 0751 SGD      Current facility-administered medications:  .  0.9 %  sodium chloride infusion, , Intravenous, Continuous, Krisna Omar, MD, Last Rate: 150 mL/hr at 01/12/16 0700 .  acetaminophen (TYLENOL) tablet  650 mg, 650 mg, Oral, Q6H PRN **OR** acetaminophen (TYLENOL) suppository 650 mg, 650 mg, Rectal, Q6H PRN, Harrie Foreman, MD .  antiseptic oral rinse solution (CORINZ), 7 mL, Mouth Rinse, QID, Harrie Foreman, MD .  aspirin EC tablet 81 mg, 81 mg, Oral, Daily, Harrie Foreman, MD .  atorvastatin (LIPITOR) tablet 40 mg, 40 mg, Per Tube, q1800, Harrie Foreman, MD .  chlorhexidine gluconate (SAGE KIT) (PERIDEX) 0.12 % solution 15 mL, 15 mL, Mouth Rinse, BID, Harrie Foreman, MD, 15 mL at 01/12/16 0800 .  fentaNYL (SUBLIMAZE) bolus via infusion 50 mcg, 50 mcg, Intravenous, Q1H PRN, Vilinda Boehringer, MD, 50 mcg at 01/12/16 1145 .  fentaNYL 2544mg in NS 2544m(1096mml) infusion-PREMIX, 25-400 mcg/hr, Intravenous, Continuous, VisVilinda BoehringerD, Stopped at 01/12/16 1030 .  midazolam (VERSED) injection 2 mg, 2 mg, Intravenous, Q15 min PRN, VisVilinda BoehringerD, 2 mg at 01/12/16 1130 .  midazolam (VERSED) injection 2 mg, 2 mg, Intravenous, Q2H PRN, Kattia Selley, MD, 2 mg at 01/12/16 1146 .  ondansetron (ZOFRAN) tablet 4 mg, 4 mg, Oral, Q6H PRN **OR** ondansetron (ZOFRAN) injection 4 mg, 4 mg, Intravenous, Q6H PRN, MicHarrie ForemanD .  pantoprazole (PROTONIX) injection 40 mg, 40 mg, Intravenous, Q24H, KurFlora LippsD, 40 mg at 01/12/16 0655 .  phenytoin (DILANTIN) injection 100 mg, 100 mg, Intravenous, Q8H, LesAlexis GoodellD  IMAGING    Dg Chest 1  View  01/12/2016  CLINICAL DATA:  Status post right IJ catheter placement today. Initial encounter. EXAM: CHEST 1 VIEW COMPARISON:  CT chest 05/05/2010. Single view of the chest 08/02/2014. FINDINGS: The patient has a new right IJ catheter. The catheter is kinked just below the clavicular head and is likely directed toward the azygos. A wire or lead extends across the right side of the heart and appears to contact the patient's right IJ catheter. This could be external to the patient or could be a wire related to IJ catheter placement. NG tube courses into the stomach. NG tube courses into the stomach. Lungs are clear. Heart size is normal. No pneumothorax or pleural effusion. IMPRESSION: Right IJ catheter tip appears directed toward the azygos. Negative for pneumothorax. Wire or lead projecting over the right side of the heart and the distal aspect the patient's IJ catheter. This case was discussed with the patient's nurse at the time interpretation. She indicated the patient is undergoing an EEG and that this may represent a lead related to that procedure. Repeat chest film immediately after the patient's EEG is recommended to exclude this density representing the wire related IJ catheter placement. Electronically Signed   By: ThoInge RiseD.   On: 01/12/2016 13:02   Dg Abd 1 View  01/12/2016  CLINICAL DATA:  OG tube placement. EXAM: ABDOMEN - 1 VIEW COMPARISON:  None. FINDINGS: Enteric tube tip in the left upper quadrant consistent with location in the mid stomach. Visualized bowel gas pattern is unremarkable. IMPRESSION: Enteric tube tip in the left upper quadrant consistent with location in the mid stomach. Electronically Signed   By: WilLucienne CapersD.   On: 01/12/2016 02:23   Ct Head Wo Contrast  01/12/2016  CLINICAL DATA:  Visual disturbances, found unresponsive with LEFT facial droop. History of hypertension and myocardial infarction. EXAM: CT HEAD WITHOUT CONTRAST TECHNIQUE: Contiguous  axial images were obtained from the base of the skull through the vertex without intravenous contrast. COMPARISON:  None. FINDINGS: Mildly motion degraded examination. INTRACRANIAL CONTENTS: The ventricles  and sulci are normal. No intraparenchymal hemorrhage, mass effect nor midline shift. No acute large vascular territory infarcts. RIGHT thalamus lacunar infarct. No abnormal extra-axial fluid collections. Basal cisterns are patent. Mild calcific atherosclerosis of the carotid siphons. ORBITS: The included ocular globes and orbital contents are normal. SINUSES: Soft tissue expands the LEFT maxillary sinus with mucoperiosteal reaction. Mastoid air cells are well aerated. SKULL/SOFT TISSUES: No skull fracture. No significant soft tissue swelling. Poor dentition. Life-support lines in place. IMPRESSION: Age indeterminate RIGHT thalamus lacunar infarct. LEFT maxillary mucocele. Acute findings discussed with and reconfirmed by Dr.JADE SUNG on 01/12/2016 at 2:10 am. Electronically Signed   By: Elon Alas M.D.   On: 01/12/2016 02:16   Dg Chest Port 1 View  01/12/2016  CLINICAL DATA:  Endotracheal tube placement EXAM: PORTABLE CHEST 1 VIEW COMPARISON:  08/02/2014 FINDINGS: Interval placement endotracheal tube with tip measuring 3.3 cm above the carina. Shallow inspiration. Heart size and pulmonary vascularity are normal. Lungs appear clear and expanded. No focal airspace disease or consolidation. No blunting of costophrenic angles. No pneumothorax. IMPRESSION: Endotracheal tube tip measures 3.3 cm above the carina. Electronically Signed   By: Lucienne Capers M.D.   On: 01/12/2016 02:22      Indwelling Urinary Catheter continued, requirement due to   Reason to continue Indwelling Urinary Catheter for strict Intake/Output monitoring for hemodynamic instability   Central Line continued, requirement due to   Reason to continue [employer] Monitoring of central venous pressure or other hemodynamic  parameters   Ventilator continued, requirement due to, resp failure    Ventilator Sedation RASS 0 to -2   STUDIES: ECHO 4/78>>GN 56%, G1 diastolic dysfunction, mild MR, mild RA dilation  Cultures: BCx2 4/20>> UC 4/20>> Sputum 4/20>> MRSA POSITIVE  Antibiotics:  Lines: RIJ CVL 4/20>>  ASSESSMENT/PLAN  45 year old male past medical history of hypertension, coronary artery disease, admitted for unresponsiveness and respiratory failure  PULMONARY Respiratory failure-unresponsive VDRF Need for MV Possible Seizure Lactic acidosis  P:   - cont with MV, wean as tolerated - maintain O2 sats >88% - SBT/WUA - prn ABG - prn Bronchodilators - fentanyl/versed  - propofol stopped due to elevated trigs.  - trend LA - PCT>  CARDIOVASCULAR CVL RIJ A:  CAD HTN Hx of MI Hx of Coronary Angioplasty with stent placement.  Possible NSTEMI P:  -monitor BP - RIJ with guide wire in place - s\p guide wire removal by vascular surgery, and placement of new RIJ CVL - maintain SBP<140 - PRN labetalol/hydralazine - cardiology following, mild troponin elevation, heparin gtt stopped  RENAL Elevated Creatinine P:   - BMP as needed - NS@75cc /hr - avoid nephrotoxic drugs - possibly due to chronic HTN   GASTROINTESTINAL Etoh Use Marijuana Use - PPI - FEN - TF - IVFs  HEMATOLOGIC -CBC  INFECTIOUS A:  No acute issues P:   - f\u cultures - PCT>>  ENDOCRINE -ICU Hypo/hyperglycemia protocol  NEUROLOGIC A:   Unresponsiveness Possible Seizure episode.  P:   RASS goal: -1,0 - EEG - neuro following - seizure precautions.  - AEDs (Dilantin) - BP control     I have personally obtained a history, examined the patient, evaluated laboratory and imaging results, formulated the assessment and plan and placed orders.  The Patient requires high complexity decision making for assessment and support, frequent evaluation and titration of therapies, application of advanced  monitoring technologies and extensive interpretation of multiple databases. Critical Care Time devoted to patient care services described in this note  is 45 minutes.   Overall, patient is critically ill, prognosis is guarded. Patient at high risk for cardiac arrest and death.   Vilinda Boehringer, MD [place] [facility] Pager [phone] (please enter 7-digits) On Call Pager [phone] (please enter 7-digits)     01/12/2016, 1:19 PM  Note: This note was prepared with Dragon dictation along with smaller phrase technology. Any transcriptional errors that result from this process are unintentional.

## 2016-01-12 NOTE — Op Note (Signed)
Duane Chen Percutaneous Study/Intervention Procedural Note   Date of Surgery: 01/12/2016  Surgeon(s): Bliss Behnke  Assistants:none  Pre-operative Diagnosis: retained foreign body after attempted line placement by another service  Post-operative diagnosis: Same  Procedure(s) Performed: 1. retrieval of intravascular foreign body in venous system (wire from central line attempt) 2. Catheter placement into right femoral vein from right jugular approach  3. placement of right jugular triple lumen catheter    EBL: 20 cc  Contrast: 0 cc   Anesthesia: moderate conscious sedation  Indications: Patient is a 45 y.o. male with a retained wire after a central line attempt by the intensivist service.  The patient is brought in for angiography for further evaluation and potential treatment. Risks and benefits are discussed and informed consent is obtained  Procedure: The patient was identified and appropriate procedural time out was performed. The patient was then placed supine on the table and prepped and draped in the usual sterile fashion. Moderate conscious sedation was administered throughout the procedure with a face to face encounter with the patient throughout and with my supervision of the RN administering medicines and monitoring the patients vital signs and mental status throughout from the start of the procedure until the patient was taken to the recovery room. Fluoroscopy was used which found that the wire was completely intravascular and only in the very tip of the catheter and not in the location where it could be removed. The existing catheter was pulled back and then rewired for a short 6 [place] sheath. Attempts to snare the wire at its central venous location in the right innominate vein and superior vena cava were difficult due to the steep angle and the fact that the wire was  along the right lateral wall. After several minutes of fluoroscopy, this location was abandoned and I decided to snare the wire on its other end which was the J-tip and was down in the right common femoral vein. A Kumpe catheter and wire were advanced down to the right femoral vein where the triple snare was then placed through the retrieval catheter. I was able to easily snare the wire in this location and pull it through the catheter in its entirety out of the body. The snare catheter was then removed. I rewired the existing 6 French sheath in the right jugular vein and then placed a triple-lumen catheter into the right jugular vein location. This was secured in place with 3 silk sutures. Fluoroscopy was used but the catheter tip at the cavoatrial junction. The patient tolerated the procedure well and the central line was flushed and ready for use.   Findings: wire in the vein from the right innominate and down to the right femoral, snared and removed without difficulty    Disposition: Patient was taken to the recovery room in stable condition having tolerated the procedure well.  Complications: None  Duane Chen 01/12/2016 3:08 PM

## 2016-01-12 NOTE — H&P (Addendum)
Duane Chen Sr. is an 45 y.o. male.   Chief Complaint: Unresponsiveness HPI: The patient presents to the emergency department via EMS after awakening from sleep, walking a few steps and then falling over unresponsive. The patient's wife states that he hit his head when he fell. He did not require CPR. In the emergency department the patient appeared to have a seizure with loss of urinary continence. He is also been posturing. Head CT was negative for stroke or bleed. He was very sonorous on physical exam which prompted the emergency department physician to intubate the patient for airway protection. Also noted were inverted T waves on telemetry which is a change from his previous EKG. Notably, the patient was admitted to the hospital earlier this week for hypertensive urgency and possible TIA. Past medical history significant for coronary artery disease and hypertension. The patient's wife admits that they had both been drinking alcohol and smoking marijuana before tonight's episode. Due to multiple medical problems emergency department staff called the hospitalist service for admission.  Past Medical History  Diagnosis Date  . MI (myocardial infarction) ([place])   . Hypertension     Past Surgical History  Procedure Laterality Date  . Coronary angioplasty with stent placement      Family History  Problem Relation Age of Onset  . Seizures Sister   . Diabetes Mellitus II Mother   . Heart disease Father    Social History:  reports that he has been smoking Cigarettes.  He has been smoking about 0.50 packs per day. He has never used smokeless tobacco. He reports that he drinks alcohol. He reports that he uses illicit drugs (Marijuana) about 7 times per week.  Allergies: No Known Allergies  Prior to Admission medications   Medication Sig Start Date End Date Taking? Authorizing Provider  aspirin EC 81 MG tablet Take 81 mg by mouth daily.   Yes Historical Provider, MD  atorvastatin (LIPITOR) 40  MG tablet Take 1 tablet (40 mg total) by mouth daily at 6 PM. 01/08/16  Yes Lytle Butte, MD  hydrochlorothiazide (HYDRODIURIL) 25 MG tablet Take 1 tablet (25 mg total) by mouth daily. 01/08/16  Yes Lytle Butte, MD  metoprolol tartrate (LOPRESSOR) 25 MG tablet Take 1 tablet (25 mg total) by mouth 2 (two) times daily. 01/08/16  Yes Lytle Butte, MD     Results for orders placed or performed during the hospital encounter of 01/12/16 (from the past 48 hour(s))  Protime-INR     Status: None   Collection Time: 01/12/16  1:34 AM  Result Value Ref Range   Prothrombin Time 14.4 11.4 - 15.0 seconds   INR 1.10   APTT     Status: None   Collection Time: 01/12/16  1:34 AM  Result Value Ref Range   aPTT 27 24 - 36 seconds  Comprehensive metabolic panel     Status: Abnormal   Collection Time: 01/12/16  1:34 AM  Result Value Ref Range   Sodium 141 135 - 145 mmol/L   Potassium 3.4 (L) 3.5 - 5.1 mmol/L   Chloride 106 101 - 111 mmol/L   CO2 24 22 - 32 mmol/L   Glucose, Bld 196 (H) 65 - 99 mg/dL   BUN 17 6 - 20 mg/dL   Creatinine, Ser 1.30 (H) 0.61 - 1.24 mg/dL   Calcium 9.9 8.9 - 10.3 mg/dL   Total Protein 8.3 (H) 6.5 - 8.1 g/dL   Albumin 4.5 3.5 - 5.0 g/dL  AST 28 15 - 41 U/L   ALT 27 17 - 63 U/L   Alkaline Phosphatase 95 38 - 126 U/L   Total Bilirubin 0.6 0.3 - 1.2 mg/dL   GFR calc non Af Amer >60 >60 mL/min   GFR calc Af Amer >60 >60 mL/min    Comment: (NOTE) The eGFR has been calculated using the CKD EPI equation. This calculation has not been validated in all clinical situations. eGFR's persistently <60 mL/min signify possible Chronic Kidney Disease.    Anion gap 11 5 - 15  CBC WITH DIFFERENTIAL     Status: Abnormal   Collection Time: 01/12/16  1:34 AM  Result Value Ref Range   WBC 12.4 (H) 3.8 - 10.6 K/uL   RBC 4.97 4.40 - 5.90 MIL/uL   Hemoglobin 15.2 13.0 - 18.0 g/dL   HCT 45.3 40.0 - 52.0 %   MCV 91.1 80.0 - 100.0 fL   MCH 30.5 26.0 - 34.0 pg   MCHC 33.5 32.0 - 36.0 g/dL    RDW 13.8 11.5 - 14.5 %   Platelets 317 150 - 440 K/uL   Neutrophils Relative % 41 %   Neutro Abs 5.1 1.4 - 6.5 K/uL   Lymphocytes Relative 41 %   Lymphs Abs 5.0 (H) 1.0 - 3.6 K/uL   Monocytes Relative 13 %   Monocytes Absolute 1.6 (H) 0.2 - 1.0 K/uL   Eosinophils Relative 4 %   Eosinophils Absolute 0.5 0 - 0.7 K/uL   Basophils Relative 1 %   Basophils Absolute 0.1 0 - 0.1 K/uL  Troponin I     Status: None   Collection Time: 01/12/16  1:34 AM  Result Value Ref Range   Troponin I <0.03 <0.031 ng/mL    Comment:        NO INDICATION OF MYOCARDIAL INJURY.   Ethanol     Status: None   Collection Time: 01/12/16  1:34 AM  Result Value Ref Range   Alcohol, Ethyl (B) <5 <5 mg/dL    Comment:        LOWEST DETECTABLE LIMIT FOR SERUM ALCOHOL IS 5 mg/dL FOR MEDICAL PURPOSES ONLY   Type and screen [place]     Status: None   Collection Time: 01/12/16  1:41 AM  Result Value Ref Range   ABO/RH(D) B POS    Antibody Screen NEG    Sample Expiration 01/15/2016   Lactic acid, plasma     Status: Abnormal   Collection Time: 01/12/16  1:41 AM  Result Value Ref Range   Lactic Acid, Venous 5.5 (HH) 0.5 - 2.0 mmol/L    Comment: READ BACK AND VERIFIED WITH Mali FOGLEMAN ON 01/12/16 AT 0221 BY TLB   Urine Drug Screen, Qualitative ([facility] only)     Status: Abnormal   Collection Time: 01/12/16  2:41 AM  Result Value Ref Range   Tricyclic, Ur Screen NONE DETECTED NONE DETECTED   Amphetamines, Ur Screen NONE DETECTED NONE DETECTED   MDMA (Ecstasy)Ur Screen NONE DETECTED NONE DETECTED   Cocaine Metabolite,Ur [place] NONE DETECTED NONE DETECTED   Opiate, Ur Screen NONE DETECTED NONE DETECTED   Phencyclidine (PCP) Ur S NONE DETECTED NONE DETECTED   Cannabinoid 50 Ng, Ur [place] POSITIVE (A) NONE DETECTED   Barbiturates, Ur Screen NONE DETECTED NONE DETECTED   Benzodiazepine, Ur Scrn POSITIVE (A) NONE DETECTED   Methadone Scn, Ur NONE DETECTED NONE DETECTED    Comment: (NOTE) 446   Tricyclics, urine  Cutoff 1000 ng/mL 200  Amphetamines, urine             Cutoff 1000 ng/mL 300  MDMA (Ecstasy), urine           Cutoff 500 ng/mL 400  Cocaine Metabolite, urine       Cutoff 300 ng/mL 500  Opiate, urine                   Cutoff 300 ng/mL 600  Phencyclidine (PCP), urine      Cutoff 25 ng/mL 700  Cannabinoid, urine              Cutoff 50 ng/mL 800  Barbiturates, urine             Cutoff 200 ng/mL 900  Benzodiazepine, urine           Cutoff 200 ng/mL 1000 Methadone, urine                Cutoff 300 ng/mL 1100 1200 The urine drug screen provides only a preliminary, unconfirmed 1300 analytical test result and should not be used for non-medical 1400 purposes. Clinical consideration and professional judgment should 1500 be applied to any positive drug screen result due to possible 1600 interfering substances. A more specific alternate chemical method 1700 must be used in order to obtain a confirmed analytical result.  1800 Gas chromato graphy / mass spectrometry (GC/MS) is the preferred 1900 confirmatory method.   Urinalysis complete, with microscopic ([facility] only)     Status: Abnormal   Collection Time: 01/12/16  2:41 AM  Result Value Ref Range   Color, Urine YELLOW (A) YELLOW   APPearance CLOUDY (A) CLEAR   Glucose, UA >500 (A) NEGATIVE mg/dL   Bilirubin Urine NEGATIVE NEGATIVE   Ketones, ur NEGATIVE NEGATIVE mg/dL   Specific Gravity, Urine 1.033 (H) 1.005 - 1.030   Hgb urine dipstick 2+ (A) NEGATIVE   pH 5.0 5.0 - 8.0   Protein, ur >500 (A) NEGATIVE mg/dL   Nitrite NEGATIVE NEGATIVE   Leukocytes, UA NEGATIVE NEGATIVE   RBC / HPF 6-30 0 - 5 RBC/hpf   WBC, UA 6-30 0 - 5 WBC/hpf   Bacteria, UA RARE (A) NONE SEEN   Squamous Epithelial / LPF 0-5 (A) NONE SEEN   Mucous PRESENT    Hyaline Casts, UA PRESENT    Dg Abd 1 View  01/12/2016  CLINICAL DATA:  OG tube placement. EXAM: ABDOMEN - 1 VIEW COMPARISON:  None. FINDINGS: Enteric tube tip in the left upper  quadrant consistent with location in the mid stomach. Visualized bowel gas pattern is unremarkable. IMPRESSION: Enteric tube tip in the left upper quadrant consistent with location in the mid stomach. Electronically Signed   By: Lucienne Capers M.D.   On: 01/12/2016 02:23   Ct Head Wo Contrast  01/12/2016  CLINICAL DATA:  Visual disturbances, found unresponsive with LEFT facial droop. History of hypertension and myocardial infarction. EXAM: CT HEAD WITHOUT CONTRAST TECHNIQUE: Contiguous axial images were obtained from the base of the skull through the vertex without intravenous contrast. COMPARISON:  None. FINDINGS: Mildly motion degraded examination. INTRACRANIAL CONTENTS: The ventricles and sulci are normal. No intraparenchymal hemorrhage, mass effect nor midline shift. No acute large vascular territory infarcts. RIGHT thalamus lacunar infarct. No abnormal extra-axial fluid collections. Basal cisterns are patent. Mild calcific atherosclerosis of the carotid siphons. ORBITS: The included ocular globes and orbital contents are normal. SINUSES: Soft tissue expands the LEFT maxillary sinus with mucoperiosteal reaction. Mastoid air cells  are well aerated. SKULL/SOFT TISSUES: No skull fracture. No significant soft tissue swelling. Poor dentition. Life-support lines in place. IMPRESSION: Age indeterminate RIGHT thalamus lacunar infarct. LEFT maxillary mucocele. Acute findings discussed with and reconfirmed by Dr.JADE SUNG on 01/12/2016 at 2:10 am. Electronically Signed   By: Elon Alas M.D.   On: 01/12/2016 02:16   Dg Chest Port 1 View  01/12/2016  CLINICAL DATA:  Endotracheal tube placement EXAM: PORTABLE CHEST 1 VIEW COMPARISON:  08/02/2014 FINDINGS: Interval placement endotracheal tube with tip measuring 3.3 cm above the carina. Shallow inspiration. Heart size and pulmonary vascularity are normal. Lungs appear clear and expanded. No focal airspace disease or consolidation. No blunting of costophrenic  angles. No pneumothorax. IMPRESSION: Endotracheal tube tip measures 3.3 cm above the carina. Electronically Signed   By: Lucienne Capers M.D.   On: 01/12/2016 02:22    Review of Systems  Constitutional: Negative for fever and chills.  HENT: Negative for sore throat and tinnitus.   Eyes: Positive for blurred vision. Negative for redness.  Respiratory: Negative for cough and shortness of breath.   Cardiovascular: Negative for chest pain, palpitations, orthopnea and PND.  Gastrointestinal: Negative for nausea, vomiting, abdominal pain and diarrhea.  Genitourinary: Negative for dysuria, urgency and frequency.  Musculoskeletal: Negative for myalgias and joint pain.  Skin: Negative for rash.       No lesions  Neurological: Positive for dizziness. Negative for speech change, focal weakness and weakness.  Endo/Heme/Allergies: Does not bruise/bleed easily.       No temperature intolerance  Psychiatric/Behavioral: Negative for depression and suicidal ideas.    Blood pressure 150/101, pulse 90, resp. rate 42, height _0  (1.854 m), weight 126 kg (277 lb 12.5 oz), SpO2 100 %. Physical Exam  Vitals reviewed. Constitutional: He appears well-developed and well-nourished. He appears distressed. He is intubated.  HENT:  Head: Normocephalic and atraumatic.  Mouth/Throat: Oropharynx is clear and moist.  Eyes: Conjunctivae and EOM are normal. Pupils are equal, round, and reactive to light. No scleral icterus.  Neck: Normal range of motion. Neck supple. No JVD present. No tracheal deviation present. No thyromegaly present.  Cardiovascular: Normal rate, regular rhythm and normal heart sounds.  Exam reveals no gallop and no friction rub.   No murmur heard. Respiratory: He is intubated.  Sonorous breath sounds  GI: Soft. Bowel sounds are normal. He exhibits no distension. There is no tenderness.  Musculoskeletal: Normal range of motion.  Lymphadenopathy:    He has no cervical adenopathy.   Neurological: He exhibits abnormal muscle tone.  Sedated and intubated; posturing   Skin: No rash noted. He is diaphoretic. No erythema.     Assessment/Plan This is a 45 year old male with history of coronary artery disease admitted for respiratory distress. 1. Respiratory distress: Etiology is multifactorial. The patient is intubated and mildly sedated. He is currently on a propofol drip. This has decreased his blood pressure some. We will try intermittent sedation and analgesia when admitted to the ICU. 2. Seizure: No reported seizure activity in the past. The patient has a family history of seizures however. I suspect his current seizure activity may be secondary to anoxia during respiratory arrest or cardiac arrest. This has resulted in decreased ability to protect his airway. The patient is currently posturing in decerebrate manner. I have asked the emergency department staff to load the patient with fosphenytoin. Neurology consult ordered. 3. NSTEMI:  Apparent spontaneous return of circulation. ST-T wave inversions; I have started the patient on a heparin drip.  Trend cardiac enzymes. Cardiology consult placed. Continue to monitor telemetry 4. Acute kidney injury: Secondary to low-flow state during possible cardiac arrest. Aggressively hydrate with intravenous fluid 5. Lactic acidosis: Etiology as above. 6. Essential hypertension: Labetalol when necessary systolic blood pressure greater and 220 7. Hyperlipidemia: Continue statin therapy 8. DVT prophylaxis: Heparin as above 9. GI prophylaxis: None The patient is a full code. Time spent on admission orders and critical patient care approximately 45 minutes  Harrie Foreman, MD 01/12/2016, 3:51 AM

## 2016-01-12 NOTE — Progress Notes (Signed)
Prayer provided for patient and family.

## 2016-01-12 NOTE — ED Provider Notes (Signed)
[facility] Emergency Department Provider Note  ____________________________________________  Time seen: Approximately 1:38 AM  I have reviewed the triage vital signs and the nursing notes.   HISTORY  Chief Complaint Unresponsive  Limited by unresponsive state  HPI Duane RAINVILLE Sr. is a 45 y.o. male brought to the ED from home via EMS with a chief complaint of unresponsive. Patient was recently hospitalized for strokelike symptoms and hypertensive urgency, discharged 4 days ago. EMS reports that wife states they had not yet picked up his new medications for blood pressure from the pharmacy. Wife stated that patient had been having vision disturbances since 11 AM yesterday. She called 911 when he told her he did not feel well and became unresponsive on the couch.Rest of history unobtainable secondary to patient unresponsive.   Past Medical History  Diagnosis Date  . MI (myocardial infarction) ([facility])   . Hypertension     Patient Active Problem List   Diagnosis Date Noted  . CVA (cerebral infarction) 01/07/2016    Past Surgical History  Procedure Laterality Date  . Coronary angioplasty with stent placement      Current Outpatient Rx  Name  Route  Sig  Dispense  Refill  . aspirin EC 81 MG tablet   Oral   Take 81 mg by mouth daily.         Marland Kitchen atorvastatin (LIPITOR) 40 MG tablet   Oral   Take 1 tablet (40 mg total) by mouth daily at 6 PM.   30 tablet   0   . hydrochlorothiazide (HYDRODIURIL) 25 MG tablet   Oral   Take 1 tablet (25 mg total) by mouth daily.   30 tablet   0   . metoprolol tartrate (LOPRESSOR) 25 MG tablet   Oral   Take 1 tablet (25 mg total) by mouth 2 (two) times daily.   60 tablet   0     Allergies Review of patient's allergies indicates no known allergies.  No family history on file.  Social History Social History  Substance Use Topics  . Smoking status: Current Every Day Smoker -- 0.50 packs/day    Types:  Cigarettes  . Smokeless tobacco: Never Used  . Alcohol Use: Yes     Comment: 6pk/week    Review of Systems  Constitutional: No fever/chills. Eyes: Positive for visual changes. ENT: No sore throat. Cardiovascular: Denies chest pain. Respiratory: Denies shortness of breath. Gastrointestinal: No abdominal pain.  No nausea, no vomiting.  No diarrhea.  No constipation. Genitourinary: Negative for dysuria. Musculoskeletal: Negative for back pain. Skin: Negative for rash. Neurological: Negative for headaches, focal weakness or numbness.  Limited secondary to unresponsive state; 10-point ROS otherwise negative.  ____________________________________________   PHYSICAL EXAM:  VITAL SIGNS: ED Triage Vitals  Enc Vitals Group     BP --      Pulse Rate 01/12/16 0129 74     Resp 01/12/16 0129 24     Temp --      Temp src --      SpO2 01/12/16 0129 100 %     Weight 01/12/16 0129 277 lb 12.5 oz (126 kg)     Height 01/12/16 0129 6\' 1"  (1.854 m)     Head Cir --      Peak Flow --      Pain Score --      Pain Loc --      Pain Edu? --      Excl. in GC? --  Constitutional: Unresponsive, diaphoretic with sonorous respirations. Eyes: Conjunctivae are normal. PERRL. EOMI. Head: Atraumatic. Nose: No congestion/rhinnorhea. Mouth/Throat: Mucous membranes are moist.  Oropharynx non-erythematous. Neck: No stridor.  No cervical spine tenderness to palpation. No carotid bruits. Cardiovascular: Normal rate, regular rhythm. Grossly normal heart sounds.  Good peripheral circulation. Respiratory: Sonorous respirations.  Gastrointestinal: Soft and nontender. No distention. No abdominal bruits. No CVA tenderness. Musculoskeletal: No lower extremity tenderness nor edema.  No joint effusions. Neurologic:  Unresponsive.  Skin:  Skin is warm, extremely diaphoretic and intact. No rash noted. Psychiatric: Unable to assess.  ____________________________________________   LABS (all labs ordered are  listed, but only abnormal results are displayed)  Labs Reviewed  COMPREHENSIVE METABOLIC PANEL - Abnormal; Notable for the following:    Potassium 3.4 (*)    Glucose, Bld 196 (*)    Creatinine, Ser 1.30 (*)    Total Protein 8.3 (*)    All other components within normal limits  CBC WITH DIFFERENTIAL/PLATELET - Abnormal; Notable for the following:    WBC 12.4 (*)    Lymphs Abs 5.0 (*)    Monocytes Absolute 1.6 (*)    All other components within normal limits  LACTIC ACID, PLASMA - Abnormal; Notable for the following:    Lactic Acid, Venous 5.5 (*)    All other components within normal limits  PROTIME-INR  APTT  CBC  DIFFERENTIAL  URINE DRUG SCREEN, QUALITATIVE ([facility] ONLY)  URINALYSIS COMPLETEWITH MICROSCOPIC ([facility] ONLY)  TROPONIN I  LACTIC ACID, PLASMA  ETHANOL  BLOOD GAS, ARTERIAL  TYPE AND SCREEN  ABO/RH   ____________________________________________  EKG  ED ECG REPORT I, Otelia Hettinger J, the attending physician, personally viewed and interpreted this ECG.   Date: 01/12/2016  EKG Time: 0247  Rate: 86  Rhythm: normal EKG, normal sinus rhythm  Axis: LAD  Intervals:none  ST&T Change: T-wave inversion inferior lateral leads  ____________________________________________  RADIOLOGY  CT head without contrast discussed with Dr. Karie Kirks: Age indeterminate RIGHT thalamus lacunar infarct.  LEFT maxillary mucocele.  Portable chest x-ray (viewed by me, interpreted per Dr. Andria Meuse): Endotracheal tube tip measures 3.3 cm above the carina.  KUB (viewed by me, interpreted per Dr. Andria Meuse): Enteric tube tip in the left upper quadrant consistent with location in the mid stomach. ____________________________________________   PROCEDURES  Procedure(s) performed:   INTUBATION Performed by: Irean Hong  Required items: required blood products, implants, devices, and special equipment available Patient identity confirmed: provided demographic data and hospital-assigned  identification number Time out: Immediately prior to procedure a "time out" was called to verify the correct patient, procedure, equipment, support staff and site/side marked as required.  Indications: Airway protection  Intubation method: Glidescope Laryngoscopy   Preoxygenation: BVM  Sedatives: Etomidate Paralytic: Succinylcholine  Tube Size: 8-0 cuffed  Post-procedure assessment: chest rise and ETCO2 monitor Breath sounds: equal and absent over the epigastrium Tube secured with: ETT holder Chest x-ray interpreted by radiologist and me.  Chest x-ray findings: endotracheal tube in appropriate position  Patient tolerated the procedure well with no immediate complications.    Critical Care performed: Yes, see critical care note(s)   CRITICAL CARE Performed by: Irean Hong   Total critical care time: 45 minutes  Critical care time was exclusive of separately billable procedures and treating other patients.  Critical care was necessary to treat or prevent imminent or life-threatening deterioration.  Critical care was time spent personally by me on the following activities: development of treatment plan with patient and/or surrogate as well as nursing,  discussions with consultants, evaluation of patient's response to treatment, examination of patient, obtaining history from patient or surrogate, ordering and performing treatments and interventions, ordering and review of laboratory studies, ordering and review of radiographic studies, pulse oximetry and re-evaluation of patient's condition.  ____________________________________________   INITIAL IMPRESSION / ASSESSMENT AND PLAN / ED COURSE  Pertinent labs & imaging results that were available during my care of the patient were reviewed by me and considered in my medical decision making (see chart for details).  45 year old male with a history of CAD and hypertension with recent hospitalization for stroke and hypertensive  urgency who presents to the ED unresponsive with sonorous respirations. During IV placement patient had seizure versus posturing with extension of his left arm and left leg/foot and urinary incontinence. He had no gag reflex and was intubated for airway protection. Will obtain stat CT head for concerns of intracranial hemorrhage, screening lab work, EKG, post intubation chest x-ray.   ----------------------------------------- 2:48 AM on 01/12/2016 -----------------------------------------  Updated spouse of CT scan and patient's condition. She provides additional history: States patient has been complaining of dizziness and blurry vision/visual deficits since 11 AM. They entertained company last evening. Admits to EtOH and marijuana use. Denies cocaine use. States at one point patient got out of bed, drag himself away and became unresponsive. She admits she is unsure of the time due to intoxication and "taking a little nap". Onset of symptoms could have been anywhere from 10 PM to 1 AM. Given this unclear timeframe and unreliable historian, patient does not qualify as an ED code stroke and therefore he is not a candidate for TPA. Blood pressure currently 154/75 after propofol bolus and drip. Will hold nicardipine for now. Discussed with hospitalist who will evaluate patient in the emergency department for admission. Patient has had no further seizure-like activity; will hold antiepileptic for now and continue to monitor.  ----------------------------------------- 3:30 AM on 01/12/2016 -----------------------------------------  Patient continues to have seizure-like activity. Upon discussion with Dr. Sheryle Hail, will order IV fosphenytoin.  ____________________________________________   FINAL CLINICAL IMPRESSION(S) / ED DIAGNOSES  Final diagnoses:  Hypertensive emergency  Hypertensive encephalopathy  Seizure-like activity ([facility])      Irean Hong, MD 01/12/16 [phone]

## 2016-01-12 NOTE — Progress Notes (Signed)
ANTICOAGULATION CONSULT NOTE - Initial Consult  Pharmacy Consult for heparin drip Indication: ACS/STEMI  No Known Allergies  Patient Measurements: Height: 6\' 1"  (185.4 cm) Weight: 277 lb 12.5 oz (126 kg) IBW/kg (Calculated) : 79.9 Heparin Dosing Weight: 108kg  Vital Signs: BP: 150/101 mmHg (04/20 0330) Pulse Rate: 90 (04/20 0330)  Labs:  Recent Labs  01/12/16 0134  HGB 15.2  HCT 45.3  PLT 317  APTT 27  LABPROT 14.4  INR 1.10  CREATININE 1.30*  TROPONINI <0.03    Estimated Creatinine Clearance: 100.8 mL/min (by C-G formula based on Cr of 1.3).   Medical History: Past Medical History  Diagnosis Date  . MI (myocardial infarction) ([facility]CC)   . Hypertension     Medications:    Assessment: No outpatient anticoagulation in med history.   Goal of Therapy:  Heparin level 0.3-0.7 units/ml Monitor platelets by anticoagulation protocol: Yes   Plan:  4000 unit bolus and initial rate of 1500 units/hr. First heparin level 6 hours after start of infusion.  Johniya Durfee S 01/12/2016,3:57 AM

## 2016-01-12 NOTE — Consult Note (Addendum)
Reason for Consult:Seizures Referring Physician: Mungal  CC: Seizures  HPI: Duane Chen Sr. is an 45 y.o. male brought to the ED from home via EMS with a chief complaint of unresponsive. Patient was recently hospitalized for strokelike symptoms (left sided numbness and weakness that resolved) and hypertensive urgency.  Discharged 4 days ago. EMS reports that wife states they had not yet picked up his new medications for blood pressure from the pharmacy. Wife stated that patient had been having vision disturbances since 11 AM on 4/19. The patient's wife states that he awakened from sleep earlier today, walked a few steps and fell over unresponsive.  He hit his head when he fell. He did not require CPR. In the emergency department the patient appeared to have a seizure with loss of urinary continence. He was also posturing. All history obtained from chart.  Patient unresponsive and family not available.    Past Medical History  Diagnosis Date  . MI (myocardial infarction) ([place])   . Hypertension     Past Surgical History  Procedure Laterality Date  . Coronary angioplasty with stent placement      Family History  Problem Relation Age of Onset  . Seizures Sister   . Diabetes Mellitus II Mother   . Heart disease Father     Social History:  reports that he has been smoking Cigarettes.  He has been smoking about 0.50 packs per day. He has never used smokeless tobacco. He reports that he drinks alcohol. He reports that he uses illicit drugs (Marijuana) about 7 times per week.  No Known Allergies  Medications:  I have reviewed the patient's current medications. Prior to Admission:  Prescriptions prior to admission  Medication Sig Dispense Refill Last Dose  . aspirin EC 81 MG tablet Take 81 mg by mouth daily.   01/11/2016 at Unknown time  . atorvastatin (LIPITOR) 40 MG tablet Take 1 tablet (40 mg total) by mouth daily at 6 PM. 30 tablet 0 01/11/2016 at Unknown time  . hydrochlorothiazide  (HYDRODIURIL) 25 MG tablet Take 1 tablet (25 mg total) by mouth daily. 30 tablet 0 01/11/2016 at Unknown time  . metoprolol tartrate (LOPRESSOR) 25 MG tablet Take 1 tablet (25 mg total) by mouth 2 (two) times daily. 60 tablet 0 01/11/2016 at Unknown time   Scheduled: . antiseptic oral rinse  7 mL Mouth Rinse QID  . aspirin EC  81 mg Oral Daily  . atorvastatin  40 mg Per Tube q1800  . chlorhexidine gluconate (SAGE KIT)  15 mL Mouth Rinse BID  . pantoprazole (PROTONIX) IV  40 mg Intravenous Q24H    ROS: History obtained from wife  General ROS: negative for - chills, fatigue, fever, night sweats, weight gain or weight loss Psychological ROS: negative for - behavioral disorder, hallucinations, memory difficulties, mood swings or suicidal ideation Ophthalmic ROS: as noted in HPI ENT ROS: negative for - epistaxis, nasal discharge, oral lesions, sore throat, tinnitus or vertigo Allergy and Immunology ROS: negative for - hives or itchy/watery eyes Hematological and Lymphatic ROS: negative for - bleeding problems, bruising or swollen lymph nodes Endocrine ROS: negative for - galactorrhea, hair pattern changes, polydipsia/polyuria or temperature intolerance Respiratory ROS: negative for - cough, hemoptysis, shortness of breath or wheezing Cardiovascular ROS: negative for - chest pain, dyspnea on exertion, edema or irregular heartbeat Gastrointestinal ROS: negative for - abdominal pain, diarrhea, hematemesis, nausea/vomiting or stool incontinence Genito-Urinary ROS: negative for - dysuria, hematuria, incontinence or urinary frequency/urgency Musculoskeletal ROS: negative for -  joint swelling or muscular weakness Neurological ROS: as noted in HPI Dermatological ROS: negative for rash and skin lesion changes  Physical Examination: Blood pressure 127/84, pulse 69, temperature 99.3 F (37.4 C), temperature source Oral, resp. rate 18, height _0  (1.854 m), weight 126 kg (277 lb 12.5 oz), SpO2 100  %.  HEENT-  Normocephalic, no lesions, without obvious abnormality.  Normal external eye and conjunctiva.  Normal TM's bilaterally.  Normal auditory canals and external ears. Normal external nose, mucus membranes and septum.  Normal pharynx. Cardiovascular- S1, S2 normal, pulses palpable throughout   Lungs- chest clear, no wheezing, rales, normal symmetric air entry Abdomen- soft, non-tender; bowel sounds normal; no masses,  no organomegaly Extremities- no edema Lymph-no adenopathy palpable Musculoskeletal-no joint tenderness, deformity or swelling Skin-warm and dry, no hyperpigmentation, vitiligo, or suspicious lesions  Neurological Examination:  Patient on Propofol. Mental Status: Patient does not respond to verbal stimuli.  With deep sternal rub appears to have decerebrate posturing with RUE and lower extremities.  Does not follow commands.  No verbalizations noted.  Cranial Nerves: II: patient does not respond confrontation bilaterally, pupils right 3 mm, left 3 mm,and reactive bilaterally III,IV,VI: doll's response absent bilaterally.  V,VII: corneal reflex reduced bilaterally  VIII: patient does not respond to verbal stimuli IX,X: gag reflex reduced, XI: trapezius strength unable to test bilaterally XII: tongue strength unable to test Motor: Extremities flaccid throughout.  No movement noted of the LUE.  Some questionable myoclonus noted often stimulus induced.   Sensory: With noxious stimuli noted to have generalized myoclonus Deep Tendon Reflexes:  1+ throughout with absent AJ's bilaterally Plantars: mute bilaterally Cerebellar: Unable to perform   Laboratory Studies:   Basic Metabolic Panel:  Recent Labs Lab 01/07/16 1258 01/12/16 0134  NA 138 141  K 4.0 3.4*  CL 111 106  CO2 21* 24  GLUCOSE 107* 196*  BUN 13 17  CREATININE 1.20 1.30*  CALCIUM 9.2 9.9    Liver Function Tests:  Recent Labs Lab 01/07/16 1258 01/12/16 0134  AST 19 28  ALT 18 27  ALKPHOS  88 95  BILITOT 0.6 0.6  PROT 7.8 8.3*  ALBUMIN 4.3 4.5   No results for input(s): LIPASE, AMYLASE in the last 168 hours. No results for input(s): AMMONIA in the last 168 hours.  CBC:  Recent Labs Lab 01/07/16 1258 01/12/16 0134  WBC 9.0 12.4*  NEUTROABS 5.2 5.1  HGB 14.7 15.2  HCT 42.8 45.3  MCV 90.3 91.1  PLT 244 317    Cardiac Enzymes:  Recent Labs Lab 01/12/16 0134 01/12/16 0551  TROPONINI <0.03 0.04*    BNP: Invalid input(s): POCBNP  CBG:  Recent Labs Lab 01/12/16 0539  GLUCAP 172*    Microbiology: Results for orders placed or performed during the hospital encounter of 01/12/16  MRSA PCR Screening     Status: Abnormal   Collection Time: 01/12/16  6:01 AM  Result Value Ref Range Status   MRSA by PCR POSITIVE (A) NEGATIVE Final    Comment:        The GeneXpert MRSA Assay (FDA approved for NASAL specimens only), is one component of a comprehensive MRSA colonization surveillance program. It is not intended to diagnose MRSA infection nor to guide or monitor treatment for MRSA infections. CRITICAL RESULT CALLED TO, READ BACK BY AND VERIFIED WITH: PAM MYERS 01/12/16 0751 SGD     Coagulation Studies:  Recent Labs  01/12/16 0134  LABPROT 14.4  INR 1.10    Urinalysis:  Recent Labs Lab 01/07/16 1413 01/12/16 0241  COLORURINE YELLOW* YELLOW*  LABSPEC 1.018 1.033*  PHURINE 5.0 5.0  GLUCOSEU NEGATIVE >500*  HGBUR 1+* 2+*  BILIRUBINUR NEGATIVE NEGATIVE  KETONESUR NEGATIVE NEGATIVE  PROTEINUR NEGATIVE >500*  NITRITE NEGATIVE NEGATIVE  LEUKOCYTESUR NEGATIVE NEGATIVE    Lipid Panel:     Component Value Date/Time   CHOL 217* 01/08/2016 0417   CHOL 236* 08/02/2014 1526   TRIG 368* 01/12/2016 0551   TRIG 251* 08/02/2014 1526   HDL 26* 01/08/2016 0417   HDL 39* 08/02/2014 1526   CHOLHDL 8.3 01/08/2016 0417   VLDL 65* 01/08/2016 0417   VLDL 50* 08/02/2014 1526   LDLCALC 126* 01/08/2016 0417   LDLCALC 147* 08/02/2014 1526    HgbA1C:   Lab Results  Component Value Date   HGBA1C 6.0 01/08/2016    Urine Drug Screen:     Component Value Date/Time   LABOPIA NONE DETECTED 01/12/2016 0241   COCAINSCRNUR NONE DETECTED 01/12/2016 0241   LABBENZ POSITIVE* 01/12/2016 0241   AMPHETMU NONE DETECTED 01/12/2016 0241   THCU POSITIVE* 01/12/2016 0241   LABBARB NONE DETECTED 01/12/2016 0241    Alcohol Level:  Recent Labs Lab 01/07/16 1258 01/12/16 0134  ETH <5 <5    Other results: GMW:NUUVO rhythm at 86 bpm, LVH.  Imaging: Dg Abd 1 View  01/12/2016  CLINICAL DATA:  OG tube placement. EXAM: ABDOMEN - 1 VIEW COMPARISON:  None. FINDINGS: Enteric tube tip in the left upper quadrant consistent with location in the mid stomach. Visualized bowel gas pattern is unremarkable. IMPRESSION: Enteric tube tip in the left upper quadrant consistent with location in the mid stomach. Electronically Signed   By: Lucienne Capers M.D.   On: 01/12/2016 02:23   Ct Head Wo Contrast  01/12/2016  CLINICAL DATA:  Visual disturbances, found unresponsive with LEFT facial droop. History of hypertension and myocardial infarction. EXAM: CT HEAD WITHOUT CONTRAST TECHNIQUE: Contiguous axial images were obtained from the base of the skull through the vertex without intravenous contrast. COMPARISON:  None. FINDINGS: Mildly motion degraded examination. INTRACRANIAL CONTENTS: The ventricles and sulci are normal. No intraparenchymal hemorrhage, mass effect nor midline shift. No acute large vascular territory infarcts. RIGHT thalamus lacunar infarct. No abnormal extra-axial fluid collections. Basal cisterns are patent. Mild calcific atherosclerosis of the carotid siphons. ORBITS: The included ocular globes and orbital contents are normal. SINUSES: Soft tissue expands the LEFT maxillary sinus with mucoperiosteal reaction. Mastoid air cells are well aerated. SKULL/SOFT TISSUES: No skull fracture. No significant soft tissue swelling. Poor dentition. Life-support lines in  place. IMPRESSION: Age indeterminate RIGHT thalamus lacunar infarct. LEFT maxillary mucocele. Acute findings discussed with and reconfirmed by Dr.JADE SUNG on 01/12/2016 at 2:10 am. Electronically Signed   By: Elon Alas M.D.   On: 01/12/2016 02:16   Dg Chest Port 1 View  01/12/2016  CLINICAL DATA:  Endotracheal tube placement EXAM: PORTABLE CHEST 1 VIEW COMPARISON:  08/02/2014 FINDINGS: Interval placement endotracheal tube with tip measuring 3.3 cm above the carina. Shallow inspiration. Heart size and pulmonary vascularity are normal. Lungs appear clear and expanded. No focal airspace disease or consolidation. No blunting of costophrenic angles. No pneumothorax. IMPRESSION: Endotracheal tube tip measures 3.3 cm above the carina. Electronically Signed   By: Lucienne Capers M.D.   On: 01/12/2016 02:22     Assessment/Plan: 44 year old male presenting with seizure activity.  Patient has had a recent presentation for left sided weakness as well.  Is a drinker.  ETOH level <  5.  On neurological examination does not appear to be using his LUE as well but patient is on sedation.  Head CT personally reviewed.  There does appear to be an age indeterminate right thalamic infarct but this is unchanged from the CT of 4/15.  BP poorly controlled.  LDL 126 on 4/16.  A1c at 6.0.  Echocardiogram showed an EF of 55% with no identifiable cardiac source.  Carotid dopplers show no hemodynamically significant stenosis.  Patient on ASA.  Recommendations: 1.  Dilantin 1066m IV now with maintenance of 1057mIV q 8hours 2.  Dilantin level in AM 3.  Seizure precautions 4.  BP control 5.  Continue ASA rectally 6.  EEG today  LeAlexis GoodellMD Neurology [phone] 5854/20/2017, 10:21 AM    Addendum: EEG reviewed and shows no epileptiform activity.    LeAlexis GoodellMD Neurology [phone]

## 2016-01-12 NOTE — Progress Notes (Signed)
Initial Nutrition Assessment    INTERVENTION:  -Received verbal order from MD Mungal to start TF via OG tube; recommend starting Vital High Protein at rate of 20 ml/hr with goal of 65 ml/hr with Prostat BID providing 167 g of protein, 1760 kcals, 1310 mL of free water. Meets 100% of calorie and protein needs per ASPEN guidelines. Continue to assess  NUTRITION DIAGNOSIS:   Inadequate oral intake related to acute illness as evidenced by NPO status.  GOAL:   Provide needs based on ASPEN/SCCM guidelines  MONITOR:   Vent status, TF tolerance, Labs, Weight trends, I & O's  REASON FOR ASSESSMENT:   Ventilator    ASSESSMENT:    45 yo male admitted with respiratory distress, seizures, NSTEMI; pt intubated on admission 01/12/16 for airway protection. Positive for cannabinoids and benzodiazepines on admission  EEG pending for later today  Past Medical History  Diagnosis Date  . MI (myocardial infarction) ([facility])   . Hypertension     Diet Order:  Diet NPO time specified Except for: Sips with Meds  Skin:  Reviewed, no issues  Last BM:  no BM documented since admission   Digestive System: OG in stomach, abdomen soft/obese, BS hypoactive  Labs: reviewed  Meds: NS at 75 ml/hr, diprivan discontinued, fentanyl, versed d/c  Height:   Ht Readings from Last 1 Encounters:  01/12/16 6\' 1"  (1.854 m)    Weight:   Wt Readings from Last 1 Encounters:  01/12/16 277 lb 12.5 oz (126 kg)    Ideal Body Weight:  83.6 kg   Wt Readings from Last 10 Encounters:  01/12/16 277 lb 12.5 oz (126 kg)  01/07/16 267 lb 14.4 oz (121.519 kg)  06/29/15 240 lb (108.863 kg)    BMI:  Body mass index is 36.66 kg/(m^2).  Estimated Nutritional Needs:   Kcal:  8337-4451 kcals using current wt of 126 kg  Protein:  >/= 168 g (2.0 g/kg IBW)  Fluid:  >/=1.7 L  EDUCATION NEEDS:   No education needs identified at this time  Romelle Starcher MS, RD, LDN [phone] Pager  [phone]  Weekend/On-Call Pager

## 2016-01-12 NOTE — Progress Notes (Signed)
eLink Physician-Brief Progress Note Patient Name: Duane FLADUNG Sr. DOB: 1971-04-02 MRN: [id number]   Date of Service  01/12/2016  HPI/Events of Note  45 yo admitted for acute ETOH and drug abuse with acute seizure s/p fall, [email or web address] for airway protection CT head shows old rt thalamic infarct  eICU Interventions  1.obtain neurology consult 2.obtain EEG 3.load with keppra 4.vent support      Intervention Category Major Interventions: Respiratory failure - evaluation and management Evaluation Type: New Patient Evaluation  Duane Chen 01/12/2016, 6:39 AM

## 2016-01-12 NOTE — Consult Note (Signed)
[facility]  CARDIOLOGY CONSULT NOTE  Patient ID: Duane MANCHEGO Sr. MRN: [id number] DOB/AGE: [birth year] 45 y.o.  Admit date: 01/12/2016 Referring Physician Dr. Dema Severin Primary Physician   Primary Cardiologist   Reason for Consultation elevated troponin  HPI: Patient is a 45 year old male brought to the emergency room after being noted be unresponsive. Patient was recently hospitalized left-sided weakness and numbness and hypertension. He was discharged 4 days ago. He had not filled any of the medications that he was discharged with. He has been noncompliant with medications. He awoke from sleep early on day of admission and then became unresponsive hitting his head when falling. Patient had evidence of a possible seizure while in the emergency room. Patient is intubated and not available. History is taken from the chart. Patient has a history of substance abuse. His tox screen revealed cannabis. There was no cocaine. Patient has a history of alcohol use with a normal level. He had a trivial troponin elevation of 0.04. Initial troponin was 0.03. This does not appear to suggest a non-ST elevation myocardial infarction. Electrocardiogram revealed sinus rhythm with LVH. Patient is currently intubated and sedated. He is hemodynamically stable. Echo was read as showing ejection fraction of 55%. There was no patent foramen ovale. No significant valvular abnormalities.  Review of Systems  Unable to perform ROS: intubated    Past Medical History  Diagnosis Date  . MI (myocardial infarction) ([facility])   . Hypertension     Family History  Problem Relation Age of Onset  . Seizures Sister   . Diabetes Mellitus II Mother   . Heart disease Father     Social History   Social History  . Marital Status: Married    Spouse Name: N/A  . Number of Children: N/A  . Years of Education: N/A   Occupational History  . Not on file.   Social History Main Topics  .  Smoking status: Current Every Day Smoker -- 0.50 packs/day    Types: Cigarettes  . Smokeless tobacco: Never Used  . Alcohol Use: Yes     Comment: 6pk/week  . Drug Use: 7.00 per week    Special: Marijuana  . Sexual Activity: Yes   Other Topics Concern  . Not on file   Social History Narrative    Past Surgical History  Procedure Laterality Date  . Coronary angioplasty with stent placement       Prescriptions prior to admission  Medication Sig Dispense Refill Last Dose  . aspirin EC 81 MG tablet Take 81 mg by mouth daily.   01/11/2016 at Unknown time  . atorvastatin (LIPITOR) 40 MG tablet Take 1 tablet (40 mg total) by mouth daily at 6 PM. 30 tablet 0 01/11/2016 at Unknown time  . hydrochlorothiazide (HYDRODIURIL) 25 MG tablet Take 1 tablet (25 mg total) by mouth daily. 30 tablet 0 01/11/2016 at Unknown time  . metoprolol tartrate (LOPRESSOR) 25 MG tablet Take 1 tablet (25 mg total) by mouth 2 (two) times daily. 60 tablet 0 01/11/2016 at Unknown time    Physical Exam: Blood pressure 138/92, pulse 75, temperature 99.4 F (37.4 C), temperature source Oral, resp. rate 26, height 6\' 1"  (1.854 m), weight 126 kg (277 lb 12.5 oz), SpO2 100 %.   Wt Readings from Last 1 Encounters:  01/12/16 126 kg (277 lb 12.5 oz)     General appearance: Intubated and sedated Resp: diminished breath sounds bilaterally Cardio: regular rate and rhythm GI: normal findings: bowel  sounds normal Neurologic: Mental status: Intubated and sedated  Labs:   Lab Results  Component Value Date   WBC 12.4* 01/12/2016   HGB 15.2 01/12/2016   HCT 45.3 01/12/2016   MCV 91.1 01/12/2016   PLT 317 01/12/2016    Recent Labs Lab 01/12/16 0134  NA 141  K 3.4*  CL 106  CO2 24  BUN 17  CREATININE 1.30*  CALCIUM 9.9  PROT 8.3*  BILITOT 0.6  ALKPHOS 95  ALT 27  AST 28  GLUCOSE 196*   Lab Results  Component Value Date   CKTOTAL 332* 08/02/2014   CKMB 3.9* 08/03/2014   TROPONINI 0.04* 01/12/2016       Radiology: Chest x-ray showed no pulmonary edema EKG: EKG showed sinus rhythm with LVH  ASSESSMENT AND PLAN:  45 year old male with history of coronary disease with OM disease by cardiac catheterization in 2015 at [facility]. PCI was not possible. He had a 30-40% LAD, 40% ramus occluded obtuse marginal occluded RCA with collaterals from left-to-right. His EF was 40-50% then. He now is admitted after an apparent collapse. He with this was witnessed. He had a seizure-like activity in the ER. He had no significant troponin elevation. Value of 0.04 is consistent with seizure-like activity. He does not appear to represent an acute coronary event. His echocardiogram is without significant wall motion at her malady. Would discontinue heparin. Would continue to workup his neurologic status and extubate as tolerated. Follow along with you and consider further cardiac workup when stable from a neurologic and pulmonary standpoint. Signed: Dalia Heading MD, [facility] 01/12/2016, 1:59 PM

## 2016-01-12 NOTE — ED Notes (Signed)
Patient back from CT.

## 2016-01-13 ENCOUNTER — Inpatient Hospital Stay: Payer: Medicaid Other

## 2016-01-13 ENCOUNTER — Encounter: Payer: Self-pay | Admitting: Vascular Surgery

## 2016-01-13 DIAGNOSIS — J9690 Respiratory failure, unspecified, unspecified whether with hypoxia or hypercapnia: Secondary | ICD-10-CM | POA: Insufficient documentation

## 2016-01-13 DIAGNOSIS — I674 Hypertensive encephalopathy: Secondary | ICD-10-CM

## 2016-01-13 DIAGNOSIS — J9600 Acute respiratory failure, unspecified whether with hypoxia or hypercapnia: Secondary | ICD-10-CM

## 2016-01-13 LAB — PROCALCITONIN: Procalcitonin: <0.1 ng/mL

## 2016-01-13 LAB — CBC
HEMATOCRIT: 39.2 % — AB (ref 40.0–52.0)
Hemoglobin: 13 g/dL (ref 13.0–18.0)
MCH: 30.7 pg (ref 26.0–34.0)
MCHC: 33.3 g/dL (ref 32.0–36.0)
MCV: 92.2 fL (ref 80.0–100.0)
PLATELETS: 213 10*3/uL (ref 150–440)
RBC: 4.26 MIL/uL — ABNORMAL LOW (ref 4.40–5.90)
RDW: 14.2 % (ref 11.5–14.5)
WBC: 13.5 10*3/uL — ABNORMAL HIGH (ref 3.8–10.6)

## 2016-01-13 LAB — COMPREHENSIVE METABOLIC PANEL
ALBUMIN: 3.4 g/dL — AB (ref 3.5–5.0)
ALT: 20 U/L (ref 17–63)
AST: 25 U/L (ref 15–41)
Alkaline Phosphatase: 61 U/L (ref 38–126)
Anion gap: 5 (ref 5–15)
BILIRUBIN TOTAL: 0.7 mg/dL (ref 0.3–1.2)
BUN: 17 mg/dL (ref 6–20)
CO2: 24 mmol/L (ref 22–32)
CREATININE: 1.14 mg/dL (ref 0.61–1.24)
Calcium: 8.8 mg/dL — ABNORMAL LOW (ref 8.9–10.3)
Chloride: 114 mmol/L — ABNORMAL HIGH (ref 101–111)
GFR calc Af Amer: >60 mL/min (ref >60)
GFR calc non Af Amer: >60 mL/min (ref >60)
GLUCOSE: 162 mg/dL — AB (ref 65–99)
POTASSIUM: 3.8 mmol/L (ref 3.5–5.1)
Sodium: 143 mmol/L (ref 135–145)
TOTAL PROTEIN: 6.4 g/dL — AB (ref 6.5–8.1)

## 2016-01-13 LAB — GLUCOSE, CAPILLARY
Glucose-Capillary: 118 mg/dL — ABNORMAL HIGH (ref 65–99)
Glucose-Capillary: 142 mg/dL — ABNORMAL HIGH (ref 65–99)
Glucose-Capillary: 142 mg/dL — ABNORMAL HIGH (ref 65–99)
Glucose-Capillary: 165 mg/dL — ABNORMAL HIGH (ref 65–99)
Glucose-Capillary: 145 mg/dL — ABNORMAL HIGH (ref 65–99)

## 2016-01-13 LAB — PHENYTOIN LEVEL, TOTAL: Phenytoin Lvl: 7.2 ug/mL — ABNORMAL LOW (ref 10.0–20.0)

## 2016-01-13 LAB — TRIGLYCERIDES: Triglycerides: 181 mg/dL — ABNORMAL HIGH (ref <150)

## 2016-01-13 MED ORDER — NOREPINEPHRINE 4 MG/250ML-% IV SOLN
0.0000 ug/min | INTRAVENOUS | Status: DC
  Administered 2016-01-13: 4 ug/min via INTRAVENOUS
  Filled 2016-01-13 (×3): qty 250

## 2016-01-13 MED ORDER — SODIUM CHLORIDE 0.9 % IV SOLN
2.0000 mg/h | INTRAVENOUS | Status: DC
  Administered 2016-01-13 – 2016-01-18 (×6): 2 mg/h via INTRAVENOUS
  Filled 2016-01-13 (×6): qty 10

## 2016-01-13 MED ORDER — VECURONIUM BROMIDE 10 MG IV SOLR
10.0000 mg | Freq: Once | INTRAVENOUS | Status: AC
  Administered 2016-01-13: 10 mg via INTRAVENOUS

## 2016-01-13 MED ORDER — VECURONIUM BROMIDE 10 MG IV SOLR
INTRAVENOUS | Status: AC
  Administered 2016-01-13: 10 mg
  Filled 2016-01-13: qty 10

## 2016-01-13 MED ORDER — NOREPINEPHRINE 4 MG/250ML-% IV SOLN
INTRAVENOUS | Status: AC
  Administered 2016-01-13: 4 mg
  Filled 2016-01-13: qty 250

## 2016-01-13 MED ORDER — HYDRALAZINE HCL 20 MG/ML IJ SOLN
INTRAMUSCULAR | Status: AC
  Administered 2016-01-13: 14:00:00
  Filled 2016-01-13: qty 1

## 2016-01-13 MED ORDER — HYDRALAZINE HCL 20 MG/ML IJ SOLN
INTRAMUSCULAR | Status: AC
  Administered 2016-01-13: 13:00:00
  Filled 2016-01-13: qty 1

## 2016-01-13 MED ORDER — FENTANYL CITRATE (PF) 100 MCG/2ML IJ SOLN
50.0000 ug | Freq: Once | INTRAMUSCULAR | Status: AC
  Administered 2016-01-13: 50 ug via INTRAVENOUS

## 2016-01-13 MED ORDER — MIDAZOLAM HCL 2 MG/2ML IJ SOLN
INTRAMUSCULAR | Status: AC
  Filled 2016-01-13: qty 4

## 2016-01-13 MED ORDER — CHLORHEXIDINE GLUCONATE CLOTH 2 % EX PADS
6.0000 | MEDICATED_PAD | Freq: Every day | CUTANEOUS | Status: AC
  Administered 2016-01-13 – 2016-01-17 (×4): 6 via TOPICAL

## 2016-01-13 MED ORDER — VANCOMYCIN HCL 10 G IV SOLR
1750.0000 mg | INTRAVENOUS | Status: AC
  Administered 2016-01-13: 1750 mg via INTRAVENOUS
  Filled 2016-01-13: qty 1750

## 2016-01-13 MED ORDER — MUPIROCIN 2 % EX OINT
1.0000 "application " | TOPICAL_OINTMENT | Freq: Two times a day (BID) | CUTANEOUS | Status: AC
  Administered 2016-01-13 – 2016-01-17 (×10): 1 via NASAL
  Filled 2016-01-13: qty 22

## 2016-01-13 MED ORDER — VECURONIUM BROMIDE 10 MG IV SOLR
INTRAVENOUS | Status: AC
  Filled 2016-01-13: qty 10

## 2016-01-13 MED ORDER — VANCOMYCIN HCL 10 G IV SOLR
1250.0000 mg | Freq: Three times a day (TID) | INTRAVENOUS | Status: DC
  Administered 2016-01-13 – 2016-01-14 (×3): 1250 mg via INTRAVENOUS
  Filled 2016-01-13 (×5): qty 1250

## 2016-01-13 MED ORDER — PIPERACILLIN-TAZOBACTAM 4.5 G IVPB
4.5000 g | Freq: Three times a day (TID) | INTRAVENOUS | Status: DC
  Administered 2016-01-13 – 2016-01-14 (×3): 4.5 g via INTRAVENOUS
  Filled 2016-01-13 (×5): qty 100

## 2016-01-13 MED ORDER — DEXTROSE 5 % IV SOLN: 0.0000 ug/min | INTRAVENOUS | Status: DC

## 2016-01-13 MED ORDER — PROPOFOL 1000 MG/100ML IV EMUL
5.0000 ug/kg/min | INTRAVENOUS | Status: DC
  Administered 2016-01-13: 25 ug/kg/min via INTRAVENOUS
  Administered 2016-01-13 (×2): 30 ug/kg/min via INTRAVENOUS
  Filled 2016-01-13 (×2): qty 100

## 2016-01-13 MED ORDER — VECURONIUM BROMIDE 10 MG IV SOLR: 10.0000 mg | Freq: Once | INTRAVENOUS | Status: AC

## 2016-01-13 MED ORDER — HYDRALAZINE HCL 20 MG/ML IJ SOLN
20.0000 mg | Freq: Once | INTRAMUSCULAR | Status: AC
  Administered 2016-01-13: 20 mg via INTRAVENOUS

## 2016-01-13 MED ORDER — PROPOFOL 1000 MG/100ML IV EMUL
INTRAVENOUS | Status: AC
  Filled 2016-01-13: qty 100

## 2016-01-13 MED ORDER — MIDAZOLAM HCL 2 MG/2ML IJ SOLN
6.0000 mg | Freq: Once | INTRAMUSCULAR | Status: AC
  Administered 2016-01-13: 6 mg via INTRAVENOUS

## 2016-01-13 MED ORDER — STERILE WATER FOR INJECTION IJ SOLN
INTRAMUSCULAR | Status: AC
  Administered 2016-01-13: 10 mL
  Filled 2016-01-13: qty 10

## 2016-01-13 MED ORDER — FENTANYL CITRATE (PF) 100 MCG/2ML IJ SOLN
100.0000 ug | Freq: Once | INTRAMUSCULAR | Status: AC
  Administered 2016-01-13: 100 ug via INTRAVENOUS

## 2016-01-13 MED ORDER — HYDRALAZINE HCL 20 MG/ML IJ SOLN: 20.0000 mg | Freq: Once | INTRAMUSCULAR | Status: AC

## 2016-01-13 NOTE — Progress Notes (Signed)
Dr  Thad Ranger (neuro) here to see pt and exam.  Updated

## 2016-01-13 NOTE — Progress Notes (Signed)
[facility]  SUBJECTIVE: intubated and sedated   Filed Vitals:   01/13/16 1330 01/13/16 1335 01/13/16 1340 01/13/16 1543  BP: 188/117 144/91 138/81   Pulse: 158 159 151   Temp:      TempSrc:      Resp: 18 18 18    Height:      Weight:      SpO2: 94% 93% 94% 98%    Intake/Output Summary (Last 24 hours) at 01/13/16 1611 Last data filed at 01/13/16 1400  Gross per 24 hour  Intake 1648.55 ml  Output   1600 ml  Net  48.55 ml    LABS: Basic Metabolic Panel:  Recent Labs  38/75/64 1319 01/13/16 0601  NA 143 143  K 4.4 3.8  CL 110 114*  CO2 24 24  GLUCOSE 113* 162*  BUN 17 17  CREATININE 1.27* 1.14  CALCIUM 9.4 8.8*   Liver Function Tests:  Recent Labs  01/12/16 0134 01/13/16 0601  AST 28 25  ALT 27 20  ALKPHOS 95 61  BILITOT 0.6 0.7  PROT 8.3* 6.4*  ALBUMIN 4.5 3.4*   No results for input(s): LIPASE, AMYLASE in the last 72 hours. CBC:  Recent Labs  01/12/16 0134 01/13/16 0601  WBC 12.4* 13.5*  NEUTROABS 5.1  --   HGB 15.2 13.0  HCT 45.3 39.2*  MCV 91.1 92.2  PLT 317 213   Cardiac Enzymes:  Recent Labs  01/12/16 0551 01/12/16 1319 01/12/16 1814  TROPONINI 0.04* 0.06* 0.10*   BNP: Invalid input(s): POCBNP D-Dimer: No results for input(s): DDIMER in the last 72 hours. Hemoglobin A1C:  Recent Labs  01/12/16 0551  HGBA1C 6.2*   Fasting Lipid Panel:  Recent Labs  01/12/16 0551  TRIG 368*   Thyroid Function Tests:  Recent Labs  01/12/16 0551  TSH 1.241   Anemia Panel: No results for input(s): VITAMINB12, FOLATE, FERRITIN, TIBC, IRON, RETICCTPCT in the last 72 hours.   Physical Exam: Blood pressure 138/81, pulse 151, temperature 100.4 F (38 C), temperature source Oral, resp. rate 18, height 6\' 1"  (1.854 m), weight 120.2 kg (264 lb 15.9 oz), SpO2 98 %.   Wt Readings from Last 1 Encounters:  01/13/16 120.2 kg (264 lb 15.9 oz)     Resp: diminished breath sounds bilaterally Cardio:  regular rate and rhythm Extremities: edema 2+  TELEMETRY: Reviewed telemetry pt in sinus tachy. Had episode of increased tachycardia during wean attempt. 12 Lead ekg shows sinus tachycardia with lvh. No st elevation. :  ASSESSMENT AND PLAN:  Active Problems:   Respiratory distress-continue to attempt vent wean. Continue with abx.    Seizure-like activity (HCC)     Dalia Heading., MD, [facility] 01/13/2016 4:11 PM

## 2016-01-13 NOTE — Progress Notes (Signed)
[facility]  Daily Progress Note  Subjective: 1 Day Post-Op: retrieval of intravascular foreign body in venous system (wire from central line attempt), catheter placement into right femoral vein from right jugular approach and placement of right jugular triple lumen catheter.  Patient sedated and intubated. No acute distress.   Objective: Filed Vitals:   01/13/16 0800 01/13/16 0837 01/13/16 0900 01/13/16 1000  BP: 132/79  134/83 132/84  Pulse:   83   Temp:    100.6 F (38.1 C)  TempSrc:    Oral  Resp: 18  19 22   Height:      Weight:      SpO2:  100% 99% 100%    Intake/Output Summary (Last 24 hours) at 01/13/16 1120 Last data filed at 01/13/16 0740  Gross per 24 hour  Intake  860.3 ml  Output   1100 ml  Net -239.7 ml   Physical Exam: Sedated and intubated Neck: Right IJ working appropriately. No swelling or drainage noted.  CV: RRR Pulmonary: CTA Bilaterally Right Groin: No swelling or drainage noted.    Laboratory: CBC    Component Value Date/Time   WBC 13.5* 01/13/2016 0601   WBC 9.3 08/03/2014 0016   HGB 13.0 01/13/2016 0601   HGB 13.7 08/03/2014 0016   HCT 39.2* 01/13/2016 0601   HCT 41.4 08/03/2014 0016   PLT 213 01/13/2016 0601   PLT 214 08/03/2014 0016   BMET    Component Value Date/Time   NA 143 01/13/2016 0601   NA 142 08/03/2014 0016   K 3.8 01/13/2016 0601   K 3.6 08/03/2014 0016   CL 114* 01/13/2016 0601   CL 110* 08/03/2014 0016   CO2 24 01/13/2016 0601   CO2 27 08/03/2014 0016   GLUCOSE 162* 01/13/2016 0601   GLUCOSE 108* 08/03/2014 0016   BUN 17 01/13/2016 0601   BUN 21* 08/03/2014 0016   CREATININE 1.14 01/13/2016 0601   CREATININE 1.45* 08/03/2014 0016   CALCIUM 8.8* 01/13/2016 0601   CALCIUM 8.4* 08/03/2014 0016   GFRNONAA >60 01/13/2016 0601   GFRNONAA 57* 08/03/2014 0016   GFRNONAA >60 03/07/2014 0015   GFRAA >60 01/13/2016 0601   GFRAA >60 08/03/2014 0016   GFRAA >60 03/07/2014 0015    Assessment/Planning: 45 year old male with recent stroke - vascular consulted for central line wire retrieval s/p retrieval of intravascular foreign body in venous system (wire from central line attempt), catheter placement into right femoral vein from right jugular approach and placement of right jugular triple lumen catheter on 01/13/16 - stable from vascular standpoint. 1) Central line working appropriately - no swelling or drainage from site 2) Groin without drainage or swelling. 3) Will sign off at this point. Please re-consult if needed.   Cleda Daub PA-C 01/13/2016 11:20 AM

## 2016-01-13 NOTE — Progress Notes (Addendum)
Pharmacy Antibiotic Note  Duane SCHUERGER Sr. is a 45 y.o. male admitted on 01/12/2016 with respiratory failure .  Pharmacy has been consulted for Vancomycin and Zosyn  dosing.  Plan: MD Mungal would like to start Vancomycin and Zosyn in this patient.   Will give Vancomycin 1750 mg IV x 1 and will then start Vancomycin 1250 mg IV q8 hours. Will order Vancomycin trough level @ 17:00 on 4/22.   Zosyn: Will start Zosyn 4.5 g IV q8 hours due to patient's weight  Height: 6\' 1"  (185.4 cm) Weight: 264 lb 15.9 oz (120.2 kg) IBW/kg (Calculated) : 79.9  Temp (24hrs), Avg:100 F (37.8 C), Min:99.5 F (37.5 C), Max:101.1 F (38.4 C)   Recent Labs Lab 01/07/16 1258 01/12/16 0134 01/12/16 0141 01/12/16 0551 01/12/16 1319 01/13/16 0601  WBC 9.0 12.4*  --   --   --  13.5*  CREATININE 1.20 1.30*  --   --  1.27* 1.14  LATICACIDVEN  --   --  5.5* 2.5*  --   --     Estimated Creatinine Clearance: 112.3 mL/min (by C-G formula based on Cr of 1.14).    No Known Allergies  Antimicrobials this admission:   Vancomycin 4/21 >>   Zosyn 4/21 >>   Dose adjustments this admission:   Microbiology results:  BCx:   UCx:    Sputum:   MRSA PCR: positive   Thank you for allowing pharmacy to be a part of this patient's care.  Terri Malerba D 01/13/2016 12:23 PM

## 2016-01-13 NOTE — Progress Notes (Signed)
Dr Dema Severin in to assess pt .  Mother at bedside all morning and in room.  Fentanyl off since 0710. Opens eyes when name called and with noxious stimulation.  Does not lock gaze.   + cough and +gag. All extremities with strong flexion and spasticity with deep pain , position changing, coughing, and suctioning.

## 2016-01-13 NOTE — Progress Notes (Signed)
Chaplain rounded the unit and provided a compassionate presence and spiritual support to the patient.  Chaplain Marquasia Schmieder [phone] 

## 2016-01-13 NOTE — Plan of Care (Signed)
Problem: Tissue Perfusion: Goal: Cerebral tissue perfusion will improve applicable to all stroke diagnoses  Outcome: Not Progressing Sedation vacation ended when pt wife and kids in room.  Pt  became agitated. HR, BP,RR up, bucking vent, coughing, sweating profusely.  Fentanyl, versed, vecuronium, hydralazine given.  Propofol and fentanyl gtts started.  EKG done after pt sedated. EKG seen by  DR Lady Gary for review.  Fever treated with tylenol. Later decreased bp de to sedation treated with levophed.  Pupils have been a size 1 to 3 and equal. Sluggish to brisk response. Goal: Complications of Intracerebral Hemorrhage will be minimized. Choose ONE based on patient diagnosis  Outcome: Progressing No signs of nausea and vomitting. TF at 74ml/hr Goal: Complications of Ischemic Stroke will be minimized. Choose ONE based on patient diagnosis  Outcome: Progressing Dr Thad Ranger (neuro) in to see.  Aware of fluctuations in neuro status with and without sedation. BP has needed apresoline while agitated and levophed while sedated  Problem: Phase I Progression Outcomes Goal: VTE prophylaxis Outcome: Progressing Heparin gtt DC'd yesterday.  MD called for orders. SCD's Goal: GIProphysixis Outcome: Progressing On protonix Goal: Sedation Protocol initiated if indicated Outcome: Progressing Now on fentanyl and propofol gtts.  Transitioning to versed gtt next shift.

## 2016-01-13 NOTE — Progress Notes (Signed)
PULMONARY / CRITICAL CARE MEDICINE   Name: Duane GRANZOW Sr. MRN: [id number] DOB: August 22, 1971    ADMISSION DATE:  01/12/2016  BRIEF HISTORY: 45 -year-old male past medical history of myocardial infarction, hypertension, coronary angioplasty with stent placement, recent admission for hypertensive urgency/acute cva, and it for unresponsiveness. History per chart review and family members at bedside, niece and mother. Chart review EMS was called late last night after patient awoke from sleep, walked a few steps then fell over became unresponsive and hit his head, upon arrival EMS did not have to perform any CPR. In the emergency department the patient appeared to have a posturing event of his extremities and loss of urine, thought to be seizure episode, given that he was posturing for less than a minute or so, he was intubated for airway protection. Head CT was negative for stroke or bleed. He was also noted to have EKG changes, and was started on heparin drip. Off note, he was admitted earlier this week for hypertensive urgency and possible TIA. Mother states that after discharge he could not fill his prescription due to financial reasons. Per chart review wife stated that they had both been drinking alcohol and smoking marijuana before accidental fall.  SUBJECTIVE:  More awake this AM on WUA, no significant purposeful movement, mother at bedside.   STUDIES: ECHO 4/16>>EF 55%, G1 diastolic dysfunction, mild MR, mild RA dilation  4/20>>alterd, fall, unresponsive, ett, UDS + for marijuana, benzos 4/20>>CVL placed, wire in RIJ, vascular retrieved wire via fluoro, new RIJ CVL placed.  4/20>> R Thalamic lucnar infarct  VITAL SIGNS: Temp:  [99.3 F (37.4 C)-101.1 F (38.4 C)] 101.1 F (38.4 C) (04/21 0740) Pulse Rate:  [68-92] 81 (04/21 0400) Resp:  [13-26] 17 (04/21 0740) BP: (113-166)/(69-106) 119/70 mmHg (04/21 0700) SpO2:  [99 %-100 %] 100 % (04/21 0415) FiO2 (%):  [40 %-50 %] 40 %  (04/21 0415) Weight:  [264 lb 15.9 oz (120.2 kg)] 264 lb 15.9 oz (120.2 kg) (04/21 0400) HEMODYNAMICS:   VENTILATOR SETTINGS: Vent Mode:  [-] PRVC FiO2 (%):  [40 %-50 %] 40 % Set Rate:  [18 bmp] 18 bmp Vt Set:  [550 mL] 550 mL PEEP:  [5 cmH20] 5 cmH20 INTAKE / OUTPUT:  Intake/Output Summary (Last 24 hours) at 01/13/16 0803 Last data filed at 01/13/16 0740  Gross per 24 hour  Intake 1283.03 ml  Output   1325 ml  Net -41.97 ml    Review of Systems  Unable to perform ROS: intubated    Physical Exam  Constitutional: He is well-developed, well-nourished, and in no distress.  HENT:  Head: Normocephalic and atraumatic.  Right Ear: External ear normal.  Left Ear: External ear normal.  Eyes: Conjunctivae are normal. Pupils are equal, round, and reactive to light.  Neck: Neck supple.  Cardiovascular: Normal rate, regular rhythm, normal heart sounds and intact distal pulses.   No murmur heard. Pulmonary/Chest:  On MV, diminished BS  Abdominal: Soft. Bowel sounds are normal. He exhibits no distension.  Musculoskeletal: He exhibits no edema.  Neurological:  Sedated (mildly), withdraws to pain, no purposeful movement.   Skin: Skin is warm and dry.  Nursing note and vitals reviewed.    LABS:  CBC  Recent Labs Lab 01/07/16 1258 01/12/16 0134 01/13/16 0601  WBC 9.0 12.4* 13.5*  HGB 14.7 15.2 13.0  HCT 42.8 45.3 39.2*  PLT 244 317 213   Coag's  Recent Labs Lab 01/07/16 1258 01/12/16 0134  APTT 28 27  INR  1.05 1.10   BMET  Recent Labs Lab 01/12/16 0134 01/12/16 1319 01/13/16 0601  NA 141 143 143  K 3.4* 4.4 3.8  CL 106 110 114*  CO2 24 24 24   BUN 17 17 17   CREATININE 1.30* 1.27* 1.14  GLUCOSE 196* 113* 162*   Electrolytes  Recent Labs Lab 01/12/16 0134 01/12/16 1319 01/13/16 0601  CALCIUM 9.9 9.4 8.8*   Sepsis Markers  Recent Labs Lab 01/12/16 0141 01/12/16 0551 01/12/16 0807 01/13/16 0601  LATICACIDVEN 5.5* 2.5*  --   --   PROCALCITON   --   --  <0.10 <0.10   ABG  Recent Labs Lab 01/12/16 0236  PHART 7.37  PCO2ART 33  PO2ART 153*   Liver Enzymes  Recent Labs Lab 01/07/16 1258 01/12/16 0134 01/13/16 0601  AST 19 28 25   ALT 18 27 20   ALKPHOS 88 95 61  BILITOT 0.6 0.6 0.7  ALBUMIN 4.3 4.5 3.4*   Cardiac Enzymes  Recent Labs Lab 01/12/16 0551 01/12/16 1319 01/12/16 1814  TROPONINI 0.04* 0.06* 0.10*   Glucose  Recent Labs Lab 01/12/16 0539 01/12/16 1959 01/12/16 2345 01/13/16 0404 01/13/16 0735  GLUCAP 172* 112* 125* 118* 142*    Imaging Dg Chest 1 View  01/12/2016  CLINICAL DATA:  Status post right IJ catheter placement today. Initial encounter. EXAM: CHEST 1 VIEW COMPARISON:  CT chest 05/05/2010. Single view of the chest 08/02/2014. FINDINGS: The patient has a new right IJ catheter. The catheter is kinked just below the clavicular head and is likely directed toward the azygos. A wire or lead extends across the right side of the heart and appears to contact the patient's right IJ catheter. This could be external to the patient or could be a wire related to IJ catheter placement. NG tube courses into the stomach. NG tube courses into the stomach. Lungs are clear. Heart size is normal. No pneumothorax or pleural effusion. IMPRESSION: Right IJ catheter tip appears directed toward the azygos. Negative for pneumothorax. Wire or lead projecting over the right side of the heart and the distal aspect the patient's IJ catheter. This case was discussed with the patient's nurse at the time interpretation. She indicated the patient is undergoing an EEG and that this may represent a lead related to that procedure. Repeat chest film immediately after the patient's EEG is recommended to exclude this density representing the wire related IJ catheter placement. Electronically Signed   By: Drusilla Kanner M.D.   On: 01/12/2016 13:02      Cultures: BCx2 4/20>> UC 4/20>> Sputum 4/20>> MRSA  POSITIVE  Antibiotics:  Lines:  ASSESSMENT / PLAN: 45 year old male past medical history of hypertension, coronary artery disease, admitted for unresponsiveness and respiratory failure  PULMONARY Respiratory failure-unresponsive VDRF Need for MV Possible Seizure Lactic acidosis R Thalamic Lacunar infarct  P:  - cont with MV, wean as tolerated - maintain O2 sats >88% - SBT/WUA - prn ABG - prn Bronchodilators - fentanyl/versed  - propofol stopped due to elevated trigs.  - trend LA - PCT>  CARDIOVASCULAR CVL RIJ A:  CAD HTN Hx of MI Hx of Coronary Angioplasty with stent placement.  Possible NSTEMI P:  -monitor BP - RIJ with guide wire in place - s\p guide wire removal by vascular surgery, and placement of new RIJ CVL - maintain SBP<140 - PRN labetalol/hydralazine - cardiology following, mild troponin elevation, heparin gtt stopped  RENAL Elevated Creatinine P:  - BMP as needed - NS@75cc /hr - avoid nephrotoxic drugs -  possibly due to chronic HTN   GASTROINTESTINAL Etoh Use Marijuana Use - PPI - FEN - TF - IVFs  HEMATOLOGIC -CBC  INFECTIOUS A: No acute issues P:  - f\u cultures - PCT>>  ENDOCRINE -ICU Hypo/hyperglycemia protocol  NEUROLOGIC A:  Unresponsiveness Possible Seizure episode.  R Thalamic Lacunar Infarct P:  RASS goal: -1,0 - EEG - neuro following - seizure precautions.  - AEDs (Dilantin) - BP control   Social  - mother at bedside, updated on findings and clinical status.   Thank you for consulting [facility], Please feel free to contacts [place] with any questions at [phone] (please enter 7-digits).  I have personally obtained a history, examined the patient, evaluated laboratory and imaging results, formulated the assessment and plan and placed orders.  The Patient requires high complexity decision making for assessment and support, frequent evaluation and titration of  therapies, application of advanced monitoring technologies and extensive interpretation of multiple databases. Critical Care Time devoted to patient care services described in this note is 40 minutes.   Overall, patient is critically ill, prognosis is guarded. Patient at high risk for cardiac arrest and death.    Stephanie Acre, MD [facility] Pager [phone] (please enter 7-digits) On Call Pager - [phone] (please enter 7-digits)  Note: This note was prepared with Dragon dictation along with smaller phrase technology. Any transcriptional errors that result from this process are unintentional.

## 2016-01-13 NOTE — Progress Notes (Signed)
Nutrition Follow-up  DOCUMENTATION CODES:   Obesity unspecified  INTERVENTION:   -Recommend continuing current TF regimen at present; meets 100% estimated calorie and protein needs per ASPEN guidelines.  -Pt may benefit from addition of bowel regimen as no BM since admission; discussed during ICU rounds   NUTRITION DIAGNOSIS:   Inadequate oral intake related to acute illness as evidenced by NPO status.  GOAL:   Provide needs based on ASPEN/SCCM guidelines  MONITOR:   Vent status, TF tolerance, Labs, Weight trends, I & O's  REASON FOR ASSESSMENT:   Ventilator    ASSESSMENT:    45 yo male admitted with respiratory distress, seizures, NSTEMI; pt intubated on admission 01/12/16 for airway protection. Positive for cannabinoids and benzodiazepines on admission  EEG on 01/12/16 shows no epileptiform activity, on dilantin, poorly responsive, less tremors Patient is currently intubated on ventilator support  Diet Order:  Diet NPO time specified Except for: Sips with Meds   EN: tolerating Vital High Protein at rate of 65 ml/hr, Prostat BID, free water flushes 100 mL q 8 hours  Skin:  Reviewed, no issues  Last BM:  no BM documented since admission   Labs: reviewed  Meds: NS at 75 ml/hr  Height:   Ht Readings from Last 1 Encounters:  01/12/16 6\' 1"  (1.854 m)    Weight:   Wt Readings from Last 1 Encounters:  01/13/16 264 lb 15.9 oz (120.2 kg)    Ideal Body Weight:  83.6 kg  BMI:  Body mass index is 34.97 kg/(m^2).  Estimated Nutritional Needs:   Kcal:  4540-9811 kcals using current wt of 126 kg  Protein:  >/= 168 g (2.0 g/kg IBW)  Fluid:  >/=1.7 L  EDUCATION NEEDS:   No education needs identified at this time  Romelle Starcher MS, RD, LDN [phone] Pager  [phone] Weekend/On-Call Pager

## 2016-01-13 NOTE — Progress Notes (Signed)
Subjective: Patient with less shaking noted per nursing.  On Dilantin.  Remains intubated.  Sedation stopped at 0730 today.    Objective: Current vital signs: BP 132/84 mmHg  Pulse 83  Temp(Src) 100.6 F (38.1 C) (Oral)  Resp 22  Ht 6' 1"  (1.854 m)  Wt 120.2 kg (264 lb 15.9 oz)  BMI 34.97 kg/m2  SpO2 99% Vital signs in last 24 hours: Temp:  [99.4 F (37.4 C)-101.1 F (38.4 C)] 100.6 F (38.1 C) (04/21 1000) Pulse Rate:  [71-92] 83 (04/21 0900) Resp:  [13-26] 22 (04/21 1000) BP: (113-166)/(69-106) 132/84 mmHg (04/21 1000) SpO2:  [99 %-100 %] 99 % (04/21 0900) FiO2 (%):  [30 %-50 %] 30 % (04/21 0837) Weight:  [120.2 kg (264 lb 15.9 oz)] 120.2 kg (264 lb 15.9 oz) (04/21 0400)  Intake/Output from previous day: 04/20 0701 - 04/21 0700 In: 1416.8 [I.V.:1412.1; NG/GT:4.7] Out: 1350 [Urine:1350] Intake/Output this shift: Total I/O In: 16.3 [I.V.:16.3] Out: 200 [Urine:200] Nutritional status: Diet NPO time specified Except for: Sips with Meds  Neurologic Exam: Mental Status: With verbal stimuli and light sternal rub the patient opens his eyes.  Does not focus.  Does not follow commands.  No verbalizations noted.  Cranial Nerves: II: patient does not respond confrontation bilaterally, pupils right 2 mm, left 2 mm,and reactive bilaterally III,IV,VI: doll's response absent bilaterally.  V,VII: corneal reflex reduced bilaterally  VIII: patient does not respond to verbal stimuli IX,X: gag reflex reduced, XI: trapezius strength unable to test bilaterally XII: tongue strength unable to test Motor: Extremities flaccid throughout.  No spontaneous movement noted.  No purposeful movements noted. Sensory: Does not respond to noxious stimuli in any extremity.   Lab Results: Basic Metabolic Panel:  Recent Labs Lab 01/07/16 1258 01/12/16 0134 01/12/16 1319 01/13/16 0601  NA 138 141 143 143  K 4.0 3.4* 4.4 3.8  CL 111 106 110 114*  CO2 21* 24 24 24   GLUCOSE 107* 196* 113* 162*   BUN 13 17 17 17   CREATININE 1.20 1.30* 1.27* 1.14  CALCIUM 9.2 9.9 9.4 8.8*    Liver Function Tests:  Recent Labs Lab 01/07/16 1258 01/12/16 0134 01/13/16 0601  AST 19 28 25   ALT 18 27 20   ALKPHOS 88 95 61  BILITOT 0.6 0.6 0.7  PROT 7.8 8.3* 6.4*  ALBUMIN 4.3 4.5 3.4*   No results for input(s): LIPASE, AMYLASE in the last 168 hours. No results for input(s): AMMONIA in the last 168 hours.  CBC:  Recent Labs Lab 01/07/16 1258 01/12/16 0134 01/13/16 0601  WBC 9.0 12.4* 13.5*  NEUTROABS 5.2 5.1  --   HGB 14.7 15.2 13.0  HCT 42.8 45.3 39.2*  MCV 90.3 91.1 92.2  PLT 244 317 213    Cardiac Enzymes:  Recent Labs Lab 01/12/16 0134 01/12/16 0551 01/12/16 1319 01/12/16 1814  TROPONINI <0.03 0.04* 0.06* 0.10*    Lipid Panel:  Recent Labs Lab 01/08/16 0417 01/12/16 0551  CHOL 217*  --   TRIG 324* 368*  HDL 26*  --   CHOLHDL 8.3  --   VLDL 65*  --   LDLCALC 126*  --     CBG:  Recent Labs Lab 01/12/16 0539 01/12/16 1959 01/12/16 2345 01/13/16 0404 01/13/16 0735  GLUCAP 172* 112* 125* 118* 142*    Microbiology: Results for orders placed or performed during the hospital encounter of 01/12/16  MRSA PCR Screening     Status: Abnormal   Collection Time: 01/12/16  6:01 AM  Result  Value Ref Range Status   MRSA by PCR POSITIVE (A) NEGATIVE Final    Comment:        The GeneXpert MRSA Assay (FDA approved for NASAL specimens only), is one component of a comprehensive MRSA colonization surveillance program. It is not intended to diagnose MRSA infection nor to guide or monitor treatment for MRSA infections. CRITICAL RESULT CALLED TO, READ BACK BY AND VERIFIED WITH: PAM MYERS 01/12/16 0751 SGD   Culture, blood (Routine X 2) w Reflex to ID Panel     Status: None (Preliminary result)   Collection Time: 01/12/16  4:54 PM  Result Value Ref Range Status   Specimen Description BLOOD LEFT HAND  Final   Special Requests BOTTLES DRAWN AEROBIC AND ANAEROBIC  [place]  Final   Culture NO GROWTH < 24 HOURS  Final   Report Status PENDING  Incomplete  Culture, blood (Routine X 2) w Reflex to ID Panel     Status: None (Preliminary result)   Collection Time: 01/12/16  5:04 PM  Result Value Ref Range Status   Specimen Description BLOOD LEFT ARM  Final   Special Requests   Final    BOTTLES DRAWN AEROBIC AND ANAEROBIC  AERO 10CC ANA 5CC   Culture NO GROWTH < 24 HOURS  Final   Report Status PENDING  Incomplete    Coagulation Studies:  Recent Labs  01/12/16 0134  LABPROT 14.4  INR 1.10    Imaging: Dg Chest 1 View  01/12/2016  CLINICAL DATA:  Status post right IJ catheter placement today. Initial encounter. EXAM: CHEST 1 VIEW COMPARISON:  CT chest 05/05/2010. Single view of the chest 08/02/2014. FINDINGS: The patient has a new right IJ catheter. The catheter is kinked just below the clavicular head and is likely directed toward the azygos. A wire or lead extends across the right side of the heart and appears to contact the patient's right IJ catheter. This could be external to the patient or could be a wire related to IJ catheter placement. NG tube courses into the stomach. NG tube courses into the stomach. Lungs are clear. Heart size is normal. No pneumothorax or pleural effusion. IMPRESSION: Right IJ catheter tip appears directed toward the azygos. Negative for pneumothorax. Wire or lead projecting over the right side of the heart and the distal aspect the patient's IJ catheter. This case was discussed with the patient's nurse at the time interpretation. She indicated the patient is undergoing an EEG and that this may represent a lead related to that procedure. Repeat chest film immediately after the patient's EEG is recommended to exclude this density representing the wire related IJ catheter placement. Electronically Signed   By: Inge Rise M.D.   On: 01/12/2016 13:02   Dg Abd 1 View  01/12/2016  CLINICAL DATA:  OG tube placement. EXAM:  ABDOMEN - 1 VIEW COMPARISON:  None. FINDINGS: Enteric tube tip in the left upper quadrant consistent with location in the mid stomach. Visualized bowel gas pattern is unremarkable. IMPRESSION: Enteric tube tip in the left upper quadrant consistent with location in the mid stomach. Electronically Signed   By: Lucienne Capers M.D.   On: 01/12/2016 02:23   Ct Head Wo Contrast  01/12/2016  CLINICAL DATA:  Visual disturbances, found unresponsive with LEFT facial droop. History of hypertension and myocardial infarction. EXAM: CT HEAD WITHOUT CONTRAST TECHNIQUE: Contiguous axial images were obtained from the base of the skull through the vertex without intravenous contrast. COMPARISON:  None. FINDINGS: Mildly motion degraded  examination. INTRACRANIAL CONTENTS: The ventricles and sulci are normal. No intraparenchymal hemorrhage, mass effect nor midline shift. No acute large vascular territory infarcts. RIGHT thalamus lacunar infarct. No abnormal extra-axial fluid collections. Basal cisterns are patent. Mild calcific atherosclerosis of the carotid siphons. ORBITS: The included ocular globes and orbital contents are normal. SINUSES: Soft tissue expands the LEFT maxillary sinus with mucoperiosteal reaction. Mastoid air cells are well aerated. SKULL/SOFT TISSUES: No skull fracture. No significant soft tissue swelling. Poor dentition. Life-support lines in place. IMPRESSION: Age indeterminate RIGHT thalamus lacunar infarct. LEFT maxillary mucocele. Acute findings discussed with and reconfirmed by Dr.JADE SUNG on 01/12/2016 at 2:10 am. Electronically Signed   By: Elon Alas M.D.   On: 01/12/2016 02:16   Dg Chest Port 1 View  01/13/2016  CLINICAL DATA:  Respiratory failure. EXAM: PORTABLE CHEST 1 VIEW COMPARISON:  01/12/2016. FINDINGS: Endotracheal tube terminates approximately 4.2 cm above the carina. Nasogastric tube terminates in the stomach. Right IJ central line tip projects at the SVC RA junction. Heart size is  accentuated by AP technique and low lung volumes. Minimal streaky atelectasis in the left lower lobe. Lungs are otherwise clear. No pleural fluid. IMPRESSION: No acute findings. Electronically Signed   By: Lorin Picket M.D.   On: 01/13/2016 08:35   Dg Chest Port 1 View  01/12/2016  CLINICAL DATA:  Endotracheal tube placement EXAM: PORTABLE CHEST 1 VIEW COMPARISON:  08/02/2014 FINDINGS: Interval placement endotracheal tube with tip measuring 3.3 cm above the carina. Shallow inspiration. Heart size and pulmonary vascularity are normal. Lungs appear clear and expanded. No focal airspace disease or consolidation. No blunting of costophrenic angles. No pneumothorax. IMPRESSION: Endotracheal tube tip measures 3.3 cm above the carina. Electronically Signed   By: Lucienne Capers M.D.   On: 01/12/2016 02:22    Medications:  I have reviewed the patient's current medications. Scheduled: . antiseptic oral rinse  7 mL Mouth Rinse QID  . aspirin EC  81 mg Oral Daily  . atorvastatin  40 mg Per Tube q1800  . chlorhexidine gluconate (SAGE KIT)  15 mL Mouth Rinse BID  . Chlorhexidine Gluconate Cloth  6 each Topical Q0600  . feeding supplement (PRO-STAT SUGAR FREE 64)  30 mL Per Tube BID  . free water  100 mL Per Tube Q8H  . mupirocin ointment  1 application Nasal BID  . pantoprazole (PROTONIX) IV  40 mg Intravenous Q24H  . phenytoin (DILANTIN) IV  100 mg Intravenous Q8H    Assessment/Plan: No further jerking noted.  Patient now off sedation but for only a short period of time.  EEG from 01/12/2016 shows no epileptiform activity.   On Dilantin.  Level 9.4 (corrected 12.1).  Remains poorly responsive.  Recommendations: 1. If no improvement in neurological examination will repeat EEG today.   2. Continue Dilantin at current dose.  Repeat Dilantin level.     LOS: 1 day   Alexis Goodell, MD Neurology [phone] 01/13/2016  10:35 AM

## 2016-01-14 ENCOUNTER — Inpatient Hospital Stay: Payer: Medicaid Other

## 2016-01-14 DIAGNOSIS — R0600 Dyspnea, unspecified: Secondary | ICD-10-CM

## 2016-01-14 LAB — BASIC METABOLIC PANEL
Anion gap: 9 (ref 5–15)
BUN: 39 mg/dL — ABNORMAL HIGH (ref 6–20)
CO2: 23 mmol/L (ref 22–32)
Calcium: 8.2 mg/dL — ABNORMAL LOW (ref 8.9–10.3)
Chloride: 110 mmol/L (ref 101–111)
Creatinine, Ser: 4.01 mg/dL — ABNORMAL HIGH (ref 0.61–1.24)
GFR calc Af Amer: 19 mL/min — ABNORMAL LOW (ref >60)
GFR calc non Af Amer: 17 mL/min — ABNORMAL LOW (ref >60)
Glucose, Bld: 170 mg/dL — ABNORMAL HIGH (ref 65–99)
Potassium: 4.5 mmol/L (ref 3.5–5.1)
Sodium: 142 mmol/L (ref 135–145)

## 2016-01-14 LAB — GLUCOSE, CAPILLARY
Glucose-Capillary: 104 mg/dL — ABNORMAL HIGH (ref 65–99)
Glucose-Capillary: 123 mg/dL — ABNORMAL HIGH (ref 65–99)
Glucose-Capillary: 125 mg/dL — ABNORMAL HIGH (ref 65–99)
Glucose-Capillary: 136 mg/dL — ABNORMAL HIGH (ref 65–99)
Glucose-Capillary: 39 mg/dL — CL (ref 65–99)
Glucose-Capillary: 61 mg/dL — ABNORMAL LOW (ref 65–99)
Glucose-Capillary: 95 mg/dL (ref 65–99)
Glucose-Capillary: 97 mg/dL (ref 65–99)

## 2016-01-14 LAB — BLOOD GAS, ARTERIAL
ACID-BASE DEFICIT: 9 mmol/L — AB (ref 0.0–2.0)
ALLENS TEST (PASS/FAIL): POSITIVE — AB
Acid-base deficit: 5.3 mmol/L — ABNORMAL HIGH (ref 0.0–2.0)
Bicarbonate: 20.3 mEq/L — ABNORMAL LOW (ref 21.0–28.0)
Bicarbonate: 21.2 mEq/L (ref 21.0–28.0)
FIO2: 1
FIO2: 1
LHR: 18 {breaths}/min
MECHANICAL RATE: 26
O2 Saturation: 99.4 %
O2 Saturation: 100 %
PATIENT TEMPERATURE: 37.9
PATIENT TEMPERATURE: 37
PCO2 ART: 59 mmHg — AB (ref 32.0–48.0)
PEEP/CPAP: 8 cmH2O
PEEP: 5 cmH2O
VT: 550 mL
VT: 550 mL
pCO2 arterial: 44 mmHg (ref 32.0–48.0)
pH, Arterial: 7.15 — CL (ref 7.350–7.450)
pH, Arterial: 7.29 — ABNORMAL LOW (ref 7.350–7.450)
pO2, Arterial: 191 mmHg — ABNORMAL HIGH (ref 83.0–108.0)
pO2, Arterial: 402 mmHg — ABNORMAL HIGH (ref 83.0–108.0)

## 2016-01-14 LAB — CBC
HEMATOCRIT: 39.6 % — AB (ref 40.0–52.0)
Hemoglobin: 13.2 g/dL (ref 13.0–18.0)
MCH: 30.8 pg (ref 26.0–34.0)
MCHC: 33.2 g/dL (ref 32.0–36.0)
MCV: 92.6 fL (ref 80.0–100.0)
PLATELETS: 75 10*3/uL — AB (ref 150–440)
RBC: 4.28 MIL/uL — AB (ref 4.40–5.90)
RDW: 14 % (ref 11.5–14.5)
WBC: 11.9 10*3/uL — AB (ref 3.8–10.6)

## 2016-01-14 LAB — EXPECTORATED SPUTUM ASSESSMENT W GRAM STAIN, RFLX TO RESP C

## 2016-01-14 LAB — VANCOMYCIN, TROUGH: VANCOMYCIN TR: 44 ug/mL — AB (ref 10–20)

## 2016-01-14 LAB — EXPECTORATED SPUTUM ASSESSMENT W REFEX TO RESP CULTURE

## 2016-01-14 LAB — PROCALCITONIN: Procalcitonin: 10.73 ng/mL

## 2016-01-14 LAB — PHENYTOIN LEVEL, TOTAL: PHENYTOIN LVL: 5.2 ug/mL — AB (ref 10.0–20.0)

## 2016-01-14 MED ORDER — IBUPROFEN 100 MG/5ML PO SUSP
400.0000 mg | Freq: Four times a day (QID) | ORAL | Status: DC | PRN
  Administered 2016-01-14 – 2016-01-19 (×5): 400 mg
  Filled 2016-01-14 (×6): qty 20

## 2016-01-14 MED ORDER — IBUPROFEN 400 MG PO TABS
600.0000 mg | ORAL_TABLET | Freq: Once | ORAL | Status: AC
  Administered 2016-01-14: 600 mg via ORAL
  Filled 2016-01-14: qty 2

## 2016-01-14 MED ORDER — STERILE WATER FOR INJECTION IJ SOLN
INTRAMUSCULAR | Status: AC
  Administered 2016-01-14: 10 mL
  Filled 2016-01-14: qty 10

## 2016-01-14 MED ORDER — SODIUM CHLORIDE 0.9 % IV SOLN
150.0000 mg | Freq: Three times a day (TID) | INTRAVENOUS | Status: DC
  Administered 2016-01-14 – 2016-01-15 (×3): 150 mg via INTRAVENOUS
  Filled 2016-01-14 (×8): qty 3

## 2016-01-14 MED ORDER — PIPERACILLIN-TAZOBACTAM 3.375 G IVPB
3.3750 g | Freq: Two times a day (BID) | INTRAVENOUS | Status: DC
  Administered 2016-01-14 – 2016-01-16 (×4): 3.375 g via INTRAVENOUS
  Filled 2016-01-14 (×5): qty 50

## 2016-01-14 MED ORDER — LABETALOL HCL 5 MG/ML IV SOLN
20.0000 mg | INTRAVENOUS | Status: DC | PRN
  Administered 2016-01-14 – 2016-01-17 (×9): 20 mg via INTRAVENOUS
  Filled 2016-01-14 (×9): qty 4

## 2016-01-14 MED ORDER — STERILE WATER FOR INJECTION IJ SOLN
INTRAMUSCULAR | Status: AC
  Administered 2016-01-14: 14:00:00
  Filled 2016-01-14: qty 10

## 2016-01-14 MED ORDER — VECURONIUM BROMIDE 10 MG IV SOLR
INTRAVENOUS | Status: AC
  Filled 2016-01-14: qty 10

## 2016-01-14 MED ORDER — VECURONIUM BROMIDE 10 MG IV SOLR
10.0000 mg | INTRAVENOUS | Status: DC | PRN
  Administered 2016-01-14 – 2016-01-18 (×13): 10 mg via INTRAVENOUS
  Filled 2016-01-14 (×13): qty 10

## 2016-01-14 NOTE — Progress Notes (Signed)
Pharmacy Antibiotic Note  Duane RESPASS Sr. is a 45 y.o. male admitted on 01/12/2016 with respiratory failure .  Pharmacy has been consulted for Vancomycin and Zosyn  dosing.  Plan Patient ordered vancomycin 1250mg  IV Q8H and zosyn 4.5gm IV Q8H.   Now with AKI SCr 1.14 >> 4.01  Will discontinue vancomycin and check trough at 1700 (when next dose would be due) Expect trough to be high due to AKI. May need to dose per levels until renal function improves.  Will also change zosyn dosing. With AKI will dose as though CrCl < 20 as cannot accurately estimate renal function in acute renal failure. Orders changed to zosyn 3.375gm IV q12H extended infusion.   Vancomycin trough resulted at 42. Vancomycin order already discontinued. Will recheck level in 12 hours.    Height: 6\' 1"  (185.4 cm) Weight: 264 lb 15.9 oz (120.2 kg) IBW/kg (Calculated) : 79.9  Temp (24hrs), Avg:101.7 F (38.7 C), Min:98.1 F (36.7 C), Max:105.8 F (41 C)   Recent Labs Lab 01/12/16 0134 01/12/16 0141 01/12/16 0551 01/12/16 1319 01/13/16 0601 01/14/16 0455 01/14/16 0610 01/14/16 1643  WBC 12.4*  --   --   --  13.5* 11.9*  --   --   CREATININE 1.30*  --   --  1.27* 1.14  --  4.01*  --   LATICACIDVEN  --  5.5* 2.5*  --   --   --   --   --   VANCOTROUGH  --   --   --   --   --   --   --  44*    Estimated Creatinine Clearance: 31.9 mL/min (by C-G formula based on Cr of 4.01).    No Known Allergies  Antimicrobials this admission:   Vancomycin 4/21 >>   Zosyn 4/21 >>   Dose adjustments this admission:   Microbiology results:  BCx:   UCx:    Sputum:   MRSA PCR: positive   Thank you for allowing pharmacy to be a part of this patient's care.  Olene Floss 01/14/2016 5:29 PM

## 2016-01-14 NOTE — Progress Notes (Signed)
Patient transported to CT and back to ICU15 without complication.

## 2016-01-14 NOTE — Progress Notes (Signed)
Pt with elevated temp at 0115(4/22) 650mg  Tylenol given. temp rechecked in 1hr still increasing, NP informed and new orders given for 600mg  IBU.  Temp recheck in 1hr still increasing, cool bath given, rectal temp probe placed and pt placed on cooling blanket.  Pt given another dose of Tylenol per NP due to increasing temp.  Will continue to monitor.

## 2016-01-14 NOTE — Progress Notes (Signed)
PULMONARY / CRITICAL CARE MEDICINE   Name: Duane DEGROFF Sr. MRN: [id number] DOB: Oct 02, 1970    ADMISSION DATE:  01/12/2016  BRIEF HISTORY: 45 -year-old male past medical history of myocardial infarction, hypertension, coronary angioplasty with stent placement, recent admission for hypertensive urgency/acute cva, and it for unresponsiveness. History per chart review and family members at bedside, niece and mother. Chart review EMS was called late last night after patient awoke from sleep, walked a few steps then fell over became unresponsive and hit his head, upon arrival EMS did not have to perform any CPR. In the emergency department the patient appeared to have a posturing event of his extremities and loss of urine, thought to be seizure episode, given that he was posturing for less than a minute or so, he was intubated for airway protection. Head CT was negative for stroke or bleed. He was also noted to have EKG changes, and was started on heparin drip. Off note, he was admitted earlier this week for hypertensive urgency and possible TIA. Mother states that after discharge he could not fill his prescription due to financial reasons. Per chart review wife stated that they had both been drinking alcohol and smoking marijuana before accidental fall.  SUBJECTIVE:  Sedated on the vent, Febrile overnight with temps in the 104s; cooling blanket, tylenol and motrin given. Temp trending down   STUDIES: ECHO 4/16>>EF 55%, G1 diastolic dysfunction, mild MR, mild RA dilation  4/20>>alterd, fall, unresponsive, ett, UDS + for marijuana, benzos 4/20>>CVL placed, wire in RIJ, vascular retrieved wire via fluoro, new RIJ CVL placed.  4/20>> R Thalamic lucnar infarct  VITAL SIGNS: Temp:  [100 F (37.8 C)-105.8 F (41 C)] 102.3 F (39.1 C) (04/22 0700) Pulse Rate:  [40-159] 90 (04/22 0700) Resp:  [15-47] 19 (04/22 0700) BP: (76-270)/(46-154) 85/57 mmHg (04/22 0700) SpO2:  [89 %-100 %] 100 % (04/22  0700) FiO2 (%):  [30 %-100 %] 40 % (04/22 0421) Weight:  [264 lb 15.9 oz (120.2 kg)] 264 lb 15.9 oz (120.2 kg) (04/22 0416) HEMODYNAMICS:   VENTILATOR SETTINGS: Vent Mode:  [-] PRVC FiO2 (%):  [30 %-100 %] 40 % Set Rate:  [18 bmp] 18 bmp Vt Set:  [550 mL] 550 mL PEEP:  [5 cmH20] 5 cmH20 INTAKE / OUTPUT:  Intake/Output Summary (Last 24 hours) at 01/14/16 0709 Last data filed at 01/14/16 0600  Gross per 24 hour  Intake 6716.64 ml  Output    925 ml  Net 5791.64 ml    Review of Systems  Unable to perform ROS: intubated    Physical Exam  Constitutional: He is well-developed, well-nourished, and in no distress.  HENT:  Head: Normocephalic and atraumatic.  Right Ear: External ear normal.  Left Ear: External ear normal.  Eyes: Conjunctivae are normal. Pupils are equal, round, and reactive to light.  Neck: Neck supple.  Cardiovascular: Normal rate, regular rhythm, normal heart sounds and intact distal pulses.   No murmur heard. Pulmonary/Chest:  On MV, diminished BS  Abdominal: Soft. Bowel sounds are normal. He exhibits no distension.  Musculoskeletal: He exhibits no edema.  Neurological:  Sedated (mildly), withdraws to pain, no purposeful movement.   Skin: Skin is warm and dry.  Nursing note and vitals reviewed.    LABS:  CBC  Recent Labs Lab 01/12/16 0134 01/13/16 0601 01/14/16 0455  WBC 12.4* 13.5* 11.9*  HGB 15.2 13.0 13.2  HCT 45.3 39.2* 39.6*  PLT 317 213 75*   Coag's  Recent Labs Lab 01/07/16 1258  01/12/16 0134  APTT 28 27  INR 1.05 1.10   BMET  Recent Labs Lab 01/12/16 0134 01/12/16 1319 01/13/16 0601  NA 141 143 143  K 3.4* 4.4 3.8  CL 106 110 114*  CO2 24 24 24   BUN 17 17 17   CREATININE 1.30* 1.27* 1.14  GLUCOSE 196* 113* 162*   Electrolytes  Recent Labs Lab 01/12/16 0134 01/12/16 1319 01/13/16 0601  CALCIUM 9.9 9.4 8.8*   Sepsis Markers  Recent Labs Lab 01/12/16 0141 01/12/16 0551 01/12/16 0807 01/13/16 0601  01/14/16 0455  LATICACIDVEN 5.5* 2.5*  --   --   --   PROCALCITON  --   --  <0.10 <0.10 10.73   ABG  Recent Labs Lab 01/12/16 0236  PHART 7.37  PCO2ART 33  PO2ART 153*   Liver Enzymes  Recent Labs Lab 01/07/16 1258 01/12/16 0134 01/13/16 0601  AST 19 28 25   ALT 18 27 20   ALKPHOS 88 95 61  BILITOT 0.6 0.6 0.7  ALBUMIN 4.3 4.5 3.4*   Cardiac Enzymes  Recent Labs Lab 01/12/16 0551 01/12/16 1319 01/12/16 1814  TROPONINI 0.04* 0.06* 0.10*   Glucose  Recent Labs Lab 01/13/16 1137 01/13/16 1622 01/13/16 2004 01/14/16 0012 01/14/16 0411 01/14/16 0707  GLUCAP 142* 165* 145* 125* 123* 95    Imaging Dg Chest Port 1 View  01/13/2016  CLINICAL DATA:  Respiratory failure. EXAM: PORTABLE CHEST 1 VIEW COMPARISON:  01/12/2016. FINDINGS: Endotracheal tube terminates approximately 4.2 cm above the carina. Nasogastric tube terminates in the stomach. Right IJ central line tip projects at the SVC RA junction. Heart size is accentuated by AP technique and low lung volumes. Minimal streaky atelectasis in the left lower lobe. Lungs are otherwise clear. No pleural fluid. IMPRESSION: No acute findings. Electronically Signed   By: Leanna Battles M.D.   On: 01/13/2016 08:35   Cultures: BCx2 4/20>> UC 4/20>> Sputum 4/20>> MRSA POSITIVE  Antibiotics:  Lines:LT IJ CVL  ASSESSMENT / PLAN: 45 year old male past medical history of hypertension, coronary artery disease, admitted for unresponsiveness and respiratory failure Found to have acute RT sided CVA-requiring full vent support  PULMONARY Respiratory failure-unresponsive VDRF Need for MV -Seizure Lactic acidosis R Thalamic Lacunar infarct  P:  - cont with MV, wean as tolerated - maintain O2 sats >88% - SBT/SAT as tolerated - prn ABG - prn Bronchodilators - fentanyl/versed  - propofol stopped due to elevated trigs.  - trend LA - PCT>10.73 -Repeat cultures  CARDIOVASCULAR CVL RIJ A:  CAD HTN Hx of  MI Hx of Coronary Angioplasty with stent placement-hemodynamically stable Possible NSTEMI P:  -monitor BP - RIJ with guide wire in place - s\p guide wire removal by vascular surgery, and placement of new RIJ CVL - maintain SBP<140 - PRN labetalol/hydralazine - cardiology following, mild troponin elevation, heparin gtt stopped  RENAL Elevated Creatinine P:  - BMP as needed - NS@75cc /hr - avoid nephrotoxic drugs - possibly due to chronic HTN   GASTROINTESTINAL Etoh Use Marijuana Use - PPI - FEN - TF - IVFs  HEMATOLOGIC -CBC  INFECTIOUS A: high fevers P:  - f\u cultures - EMPIRIC ABX -F/U PCT -check lipase  ENDOCRINE -ICU Hypo/hyperglycemia protocol  NEUROLOGIC A:  Unresponsiveness Possible Seizure episode.  R Thalamic Lacunar Infarct P:  RASS goal: -1,0 - EEG done; report pending - neuro following - seizure precautions.  - AEDs (Dilantin) - BP control  -repeat CT head Case discuss with Neurology-if CT head does not show any significant changes, Neurologist  will plan for Lumbar Puncture  Best Practice: Code Status:  Full. Diet: NPO GI prophylaxis:  PPI. VTE prophylaxis:  SCD's / F/U platelets, if normal  Schedule SQ heaprin   Magdalene S. [facility] ANP-BC Pulmonary and Critical Care Medicine [facility] Pager [phone]  01/14/2016 @07 :17    STAFF NOTE: I, Dr. Lucie Chen,  have personally reviewed patient's available data, including medical history, events of note, physical examination and test results as part of my evaluation. I have discussed with NP and other care providers such as pharmacist, RN and RRT.  In addition,  I personally evaluated patient and elicited key findings  Gcs<8T, fevers 101.5 with cooling blanket   A:acute resp failure from acute CVA with encephlopathy  P: repeat head CT , continue vent support Will discuss with family regarding early trach and PEG tube    The Rest per NP whose note is  outlined above and that I agree with  I have personally reviewed/obtained a history, examined the patient, evaluated Pertinent laboratory and RadioGraphic/imaging results, and  formulated the assessment and plan   The Patient requires high complexity decision making for assessment and support, frequent evaluation and titration of therapies, application of advanced monitoring technologies and extensive interpretation of multiple databases. Critical Care Time devoted to patient care services described in this note is 35 minutes.  This Critical care time does not reflrect procedure time or supervisory time of NP but could involve care discussion time Overall, patient is critically ill, prognosis is guarded.  Patient with Multiorgan failure and at high risk for cardiac arrest and death.    Duane Chen, M.D.  [facility]  Medical Director [facility] Medical Director [facility] Cardio-Pulmonary Department

## 2016-01-14 NOTE — Progress Notes (Addendum)
eLink Physician-Brief Progress Note Patient Name: Duane FINKEN Sr. DOB: 1971-01-11 MRN: [id number]   Date of Service  01/14/2016  HPI/Events of Note  CXR - low lung volumes. No pneumothorax. Looks more comfortable with increased sedation.   eICU Interventions  Will increase PEEP to 8.      Intervention Category Major Interventions: Hypoxemia - evaluation and management  Trinten Boudoin Eugene 01/14/2016, 5:05 PM

## 2016-01-14 NOTE — Progress Notes (Signed)
PEEP increased to 8 per order from Dr. Dellie Catholic via e-link.

## 2016-01-14 NOTE — Progress Notes (Signed)
Subjective: Remains unresponsive.  Has been febrile overnight.    Objective: Current vital signs: BP 85/57 mmHg  Pulse 32  Temp(Src) 101.2 F (38.4 C) (Rectal)  Resp 22  Ht 6' 1" (1.854 m)  Wt 120.2 kg (264 lb 15.9 oz)  BMI 34.97 kg/m2  SpO2 100% Vital signs in last 24 hours: Temp:  [100 F (37.8 C)-105.8 F (41 C)] 101.2 F (38.4 C) (04/22 0800) Pulse Rate:  [32-159] 32 (04/22 0800) Resp:  [15-47] 22 (04/22 0800) BP: (76-270)/(46-154) 85/57 mmHg (04/22 0700) SpO2:  [89 %-100 %] 100 % (04/22 0906) FiO2 (%):  [30 %-100 %] 40 % (04/22 0906) Weight:  [120.2 kg (264 lb 15.9 oz)] 120.2 kg (264 lb 15.9 oz) (04/22 0416)  Intake/Output from previous day: 04/21 0701 - 04/22 0700 In: 6888.6 [I.V.:3468.6; NG/GT:1770; IV Piggyback:1300] Out: 925 [Urine:925] Intake/Output this shift:   Nutritional status: Diet NPO time specified Except for: Sips with Meds  Neurologic Exam: Mental Status: Patient does not respond to verbal stimuli.  Does not respond to deep sternal rub.  Does not follow commands.  No verbalizations noted.  Cranial Nerves: II: patient does not respond confrontation bilaterally, pupils right 2 mm, left 2 mm,and unreactive bilaterally III,IV,VI: doll's response absent bilaterally.  V,VII: corneal reflex absent bilaterally  VIII: patient does not respond to verbal stimuli IX,X: gag reflex reduced, XI: trapezius strength unable to test bilaterally XII: tongue strength unable to test Motor: Extremities flaccid throughout.  No spontaneous movement noted.  No purposeful movements noted. Sensory: Does not respond to noxious stimuli in any extremity.   Lab Results: Basic Metabolic Panel:  Recent Labs Lab 01/07/16 1258 01/12/16 0134 01/12/16 1319 01/13/16 0601 01/14/16 0610  NA 138 141 143 143 142  K 4.0 3.4* 4.4 3.8 4.5  CL 111 106 110 114* 110  CO2 21* _0 GLUCOSE 107* 196* 113* 162* 170*  BUN _1 39*  CREATININE 1.20 1.30* 1.27* 1.14 4.01*   CALCIUM 9.2 9.9 9.4 8.8* 8.2*    Liver Function Tests:  Recent Labs Lab 01/07/16 1258 01/12/16 0134 01/13/16 0601  AST _2 ALT _3 ALKPHOS 88 95 61  BILITOT 0.6 0.6 0.7  PROT 7.8 8.3* 6.4*  ALBUMIN 4.3 4.5 3.4*   No results for input(s): LIPASE, AMYLASE in the last 168 hours. No results for input(s): AMMONIA in the last 168 hours.  CBC:  Recent Labs Lab 01/07/16 1258 01/12/16 0134 01/13/16 0601 01/14/16 0455  WBC 9.0 12.4* 13.5* 11.9*  NEUTROABS 5.2 5.1  --   --   HGB 14.7 15.2 13.0 13.2  HCT 42.8 45.3 39.2* 39.6*  MCV 90.3 91.1 92.2 92.6  PLT 244 317 213 75*    Cardiac Enzymes:  Recent Labs Lab 01/12/16 0134 01/12/16 0551 01/12/16 1319 01/12/16 1814  TROPONINI <0.03 0.04* 0.06* 0.10*    Lipid Panel:  Recent Labs Lab 01/08/16 0417 01/12/16 0551 01/13/16 1809  CHOL 217*  --   --   TRIG 324* 368* 181*  HDL 26*  --   --   CHOLHDL 8.3  --   --   VLDL 65*  --   --   LDLCALC 126*  --   --     CBG:  Recent Labs Lab 01/13/16 1622 01/13/16 2004 01/14/16 0012 01/14/16 0411 01/14/16 0707  GLUCAP 165* 145* 125* 123* 95    Microbiology: Results for orders placed or performed during the hospital encounter of  01/12/16  MRSA PCR Screening     Status: Abnormal   Collection Time: 01/12/16  6:01 AM  Result Value Ref Range Status   MRSA by PCR POSITIVE (A) NEGATIVE Final    Comment:        The GeneXpert MRSA Assay (FDA approved for NASAL specimens only), is one component of a comprehensive MRSA colonization surveillance program. It is not intended to diagnose MRSA infection nor to guide or monitor treatment for MRSA infections. CRITICAL RESULT CALLED TO, READ BACK BY AND VERIFIED WITH: PAM MYERS 01/12/16 0751 SGD   Culture, blood (Routine X 2) w Reflex to ID Panel     Status: None (Preliminary result)   Collection Time: 01/12/16  4:54 PM  Result Value Ref Range Status   Specimen Description BLOOD LEFT HAND  Final   Special  Requests BOTTLES DRAWN AEROBIC AND ANAEROBIC [place]  Final   Culture NO GROWTH < 24 HOURS  Final   Report Status PENDING  Incomplete  Culture, blood (Routine X 2) w Reflex to ID Panel     Status: None (Preliminary result)   Collection Time: 01/12/16  5:04 PM  Result Value Ref Range Status   Specimen Description BLOOD LEFT ARM  Final   Special Requests   Final    BOTTLES DRAWN AEROBIC AND ANAEROBIC  AERO 10CC ANA 5CC   Culture NO GROWTH < 24 HOURS  Final   Report Status PENDING  Incomplete    Coagulation Studies:  Recent Labs  01/12/16 0134  LABPROT 14.4  INR 1.10    Imaging: Dg Chest 1 View  01/12/2016  CLINICAL DATA:  Status post right IJ catheter placement today. Initial encounter. EXAM: CHEST 1 VIEW COMPARISON:  CT chest 05/05/2010. Single view of the chest 08/02/2014. FINDINGS: The patient has a new right IJ catheter. The catheter is kinked just below the clavicular head and is likely directed toward the azygos. A wire or lead extends across the right side of the heart and appears to contact the patient's right IJ catheter. This could be external to the patient or could be a wire related to IJ catheter placement. NG tube courses into the stomach. NG tube courses into the stomach. Lungs are clear. Heart size is normal. No pneumothorax or pleural effusion. IMPRESSION: Right IJ catheter tip appears directed toward the azygos. Negative for pneumothorax. Wire or lead projecting over the right side of the heart and the distal aspect the patient's IJ catheter. This case was discussed with the patient's nurse at the time interpretation. She indicated the patient is undergoing an EEG and that this may represent a lead related to that procedure. Repeat chest film immediately after the patient's EEG is recommended to exclude this density representing the wire related IJ catheter placement. Electronically Signed   By: Inge Rise M.D.   On: 01/12/2016 13:02   Dg Chest Port 1  View  01/14/2016  CLINICAL DATA:  Acute respiratory failure.  History of hypertension. EXAM: PORTABLE CHEST 1 VIEW COMPARISON:  01/13/2016 FINDINGS: Lungs remain clear.  Cardiac silhouette is normal in size. No convincing pleural effusion.  No pneumothorax. Endotracheal tube, right internal jugular central venous line and nasal/ orogastric tube are stable in well positioned. IMPRESSION: 1. No acute findings in the lungs. 2. Support apparatus is well positioned. 3. No change from prior study. Electronically Signed   By: Lajean Manes M.D.   On: 01/14/2016 07:47   Dg Chest Port 1 View  01/13/2016  CLINICAL DATA:  Respiratory failure. EXAM: PORTABLE CHEST 1 VIEW COMPARISON:  01/12/2016. FINDINGS: Endotracheal tube terminates approximately 4.2 cm above the carina. Nasogastric tube terminates in the stomach. Right IJ central line tip projects at the SVC RA junction. Heart size is accentuated by AP technique and low lung volumes. Minimal streaky atelectasis in the left lower lobe. Lungs are otherwise clear. No pleural fluid. IMPRESSION: No acute findings. Electronically Signed   By: Lorin Picket M.D.   On: 01/13/2016 08:35    Medications:  I have reviewed the patient's current medications. Scheduled: . antiseptic oral rinse  7 mL Mouth Rinse QID  . aspirin EC  81 mg Oral Daily  . atorvastatin  40 mg Per Tube q1800  . chlorhexidine gluconate (SAGE KIT)  15 mL Mouth Rinse BID  . Chlorhexidine Gluconate Cloth  6 each Topical Q0600  . feeding supplement (PRO-STAT SUGAR FREE 64)  30 mL Per Tube BID  . free water  100 mL Per Tube Q8H  . mupirocin ointment  1 application Nasal BID  . pantoprazole (PROTONIX) IV  40 mg Intravenous Q24H  . phenytoin (DILANTIN) IV  100 mg Intravenous Q8H  . piperacillin-tazobactam (ZOSYN)  IV  4.5 g Intravenous Q8H  . vancomycin  1,250 mg Intravenous Q8H    Assessment/Plan: Patient remains unresponsive.  Currently on sedation.  EEG performed on 4/21 shows no epileptiform  activity.  No clinical seizure activity noted.  Patient on Dilantin.  Trough level on yesterday was 7.2.    Recommendations: 1.  Increase maintenance Dilantin to 119m q hours 2.  Dilantin level in AM 3.  Repeat head CT today without contrast 4.  If no obvious source of infection from recently ordered work up for fevers, patient will require LP.    Case discussed with Dr. KMortimer Fries  LOS: 2 days   LAlexis Goodell MD Neurology [phone]/22/2017  9:22 AM

## 2016-01-14 NOTE — Progress Notes (Signed)
Notified Dr. Arsenio Loader of ABG result- he ordered to increase respiratory rate to 26 and ordered another ABG at 7:15 tonight- notified Angie with respiratory with this information.

## 2016-01-14 NOTE — Progress Notes (Signed)
Notified Dr. Belia Heman this am that Patient had fevers last night as high as 105.8- on cooling blanket- cultures were sent last night- blood, urine and sputum. Per Dr. Belia Heman- do not perform sedation vacation until the wife is present at bedside.

## 2016-01-14 NOTE — Progress Notes (Signed)
Notified Dr. Belia Heman that patient's BP is in the 200's but last BP was 180's-that patient had been off sedation and patient's wife said he opened his eyes for her.  I did not see this assessment- when I assessed him- he was breathing 35 times/min- HR in 130's- not following commands or opening eyes for me- no gag- also that patient is now having blood-tinged secretions from ET when suctioned.  Dr. Belia Heman gave me orders for Vecuronium and to re-sedate patient and keep him comfortable.

## 2016-01-14 NOTE — Progress Notes (Addendum)
Notifed Dr. Dellie Catholic with [facility] about patient having guppy breathing- on the vent.  Sedation fentanyl at 281mcqs/hr- had given labetalol earlier for BP's in the 200's systolic- temperature increasing to 101. Informed him of the pink tinged secretion from ET tube/suction. Per Dr. Dellie Catholic- to increase sedation and give vecuronium.  ABG and chest xray ordered.  I increased sedation of fentanyl to 263mcqs/hr.

## 2016-01-14 NOTE — Progress Notes (Addendum)
eLink Physician-Brief Progress Note Patient Name: Duane SARRA Sr. DOB: 06/19/1971 MRN: [id number]   Date of Service  01/14/2016  HPI/Events of Note  Respiratory Distress - associated with increased RR and hypoxia.   eICU Interventions  Will order: 1. Portable CXR STAT. 2. ABG STAT. 3. Increase sedation with Fentanyl IV infusion.     Intervention Category Intermediate Interventions: Respiratory distress - evaluation and management  Sommer,Steven Eugene 01/14/2016, 4:22 PM

## 2016-01-14 NOTE — Progress Notes (Signed)
eLink Physician-Brief Progress Note Patient Name: Duane MOUSLEY Sr. DOB: 02/09/1971 MRN: [id number]   Date of Service  01/14/2016  HPI/Events of Note  ABG on 100%/PRVC 18/TV 550/P 8 = 7.15/59/191/20.3  eICU Interventions  Will order: 1. Increase PRVC rate to 26. 2. ABG at 7:15 PM.     Intervention Category Major Interventions: Acid-Base disturbance - evaluation and management;Respiratory failure - evaluation and management  Lenell Antu 01/14/2016, 6:05 PM

## 2016-01-14 NOTE — Progress Notes (Signed)
[facility]  SUBJECTIVE: intubated   Filed Vitals:   01/14/16 1400 01/14/16 1500 01/14/16 1558 01/14/16 1600  BP: 230/107 236/123  112/78  Pulse:  116  96  Temp:    101.3 F (38.5 C)  TempSrc:    Rectal  Resp: 19 18  18   Height:      Weight:      SpO2:  94% 83% 84%    Intake/Output Summary (Last 24 hours) at 01/14/16 1623 Last data filed at 01/14/16 1609  Gross per 24 hour  Intake 6111.82 ml  Output    600 ml  Net 5511.82 ml    LABS: Basic Metabolic Panel:  Recent Labs  20/23/34 0601 01/14/16 0610  NA 143 142  K 3.8 4.5  CL 114* 110  CO2 24 23  GLUCOSE 162* 170*  BUN 17 39*  CREATININE 1.14 4.01*  CALCIUM 8.8* 8.2*   Liver Function Tests:  Recent Labs  01/12/16 0134 01/13/16 0601  AST 28 25  ALT 27 20  ALKPHOS 95 61  BILITOT 0.6 0.7  PROT 8.3* 6.4*  ALBUMIN 4.5 3.4*   No results for input(s): LIPASE, AMYLASE in the last 72 hours. CBC:  Recent Labs  01/12/16 0134 01/13/16 0601 01/14/16 0455  WBC 12.4* 13.5* 11.9*  NEUTROABS 5.1  --   --   HGB 15.2 13.0 13.2  HCT 45.3 39.2* 39.6*  MCV 91.1 92.2 92.6  PLT 317 213 75*   Cardiac Enzymes:  Recent Labs  01/12/16 0551 01/12/16 1319 01/12/16 1814  TROPONINI 0.04* 0.06* 0.10*   BNP: Invalid input(s): POCBNP D-Dimer: No results for input(s): DDIMER in the last 72 hours. Hemoglobin A1C:  Recent Labs  01/12/16 0551  HGBA1C 6.2*   Fasting Lipid Panel:  Recent Labs  01/13/16 1809  TRIG 181*   Thyroid Function Tests:  Recent Labs  01/12/16 0551  TSH 1.241   Anemia Panel: No results for input(s): VITAMINB12, FOLATE, FERRITIN, TIBC, IRON, RETICCTPCT in the last 72 hours.   Physical Exam: Blood pressure 112/78, pulse 96, temperature 101.3 F (38.5 C), temperature source Rectal, resp. rate 18, height 6\' 1"  (1.854 m), weight 120.2 kg (264 lb 15.9 oz), SpO2 84 %.   Wt Readings from Last 1 Encounters:  01/14/16 120.2 kg (264 lb 15.9 oz)      General appearance: intubated Resp: rhonchi bilaterally Cardio: regular rate and rhythm GI: normal findings: no bruits heard Neurologic: Mental status: alertness: intubated  TELEMETRY: Reviewed telemetry pt in nsr:  ASSESSMENT AND PLAN:  Active Problems:   Respiratory distress-nsr. No further arythmia. Conitnue to attempt to wean vent.    Seizure-like activity (HCC)   Acute respiratory failure with hypoxia (HCC)   Hypertensive encephalopathy   Respiratory failure (HCC)    Dalia Heading., MD, [facility] 01/14/2016 4:23 PM

## 2016-01-14 NOTE — Progress Notes (Addendum)
Notified both Dr. Belia Heman and Dr. Thad Ranger about results of head CT of patient.  Turned sedation off after patient came back from CT of the head- around 11am- patient still not responding at this time- no gag reflex when suctioned.  No new orders.

## 2016-01-14 NOTE — Progress Notes (Signed)
Pharmacy Antibiotic Note  JEMIR SOERGEL Sr. is a 45 y.o. male admitted on 01/12/2016 with respiratory failure .  Pharmacy has been consulted for Vancomycin and Zosyn  dosing.  Plan Patient ordered vancomycin 1250mg  IV Q8H and zosyn 4.5gm IV Q8H.   Now with AKI SCr 1.14 >> 4.01  Will discontinue vancomycin and check trough at 1700 (when next dose would be due) Expect trough to be high due to AKI. May need to dose per levels until renal function improves.  Will also change zosyn dosing. With AKI will dose as though CrCl < 20 as cannot accurately estimate renal function in acute renal failure. Orders changed to zosyn 3.375gm IV q12H extended infusion.    Height: 6\' 1"  (185.4 cm) Weight: 264 lb 15.9 oz (120.2 kg) IBW/kg (Calculated) : 79.9  Temp (24hrs), Avg:101.7 F (38.7 C), Min:98.1 F (36.7 C), Max:105.8 F (41 C)   Recent Labs Lab 01/12/16 0134 01/12/16 0141 01/12/16 0551 01/12/16 1319 01/13/16 0601 01/14/16 0455 01/14/16 0610  WBC 12.4*  --   --   --  13.5* 11.9*  --   CREATININE 1.30*  --   --  1.27* 1.14  --  4.01*  LATICACIDVEN  --  5.5* 2.5*  --   --   --   --     Estimated Creatinine Clearance: 31.9 mL/min (by C-G formula based on Cr of 4.01).    No Known Allergies  Antimicrobials this admission:   Vancomycin 4/21 >>   Zosyn 4/21 >>   Dose adjustments this admission:   Microbiology results:  BCx:   UCx:    Sputum:   MRSA PCR: positive   Thank you for allowing pharmacy to be a part of this patient's care.  Jayten Gabbard C 01/14/2016 1:37 PM

## 2016-01-15 ENCOUNTER — Inpatient Hospital Stay: Payer: Medicaid Other

## 2016-01-15 LAB — URINE CULTURE: CULTURE: NO GROWTH

## 2016-01-15 LAB — PHENYTOIN LEVEL, TOTAL: Phenytoin Lvl: 6 ug/mL — ABNORMAL LOW (ref 10.0–20.0)

## 2016-01-15 LAB — GLUCOSE, CAPILLARY
Glucose-Capillary: 114 mg/dL — ABNORMAL HIGH (ref 65–99)
Glucose-Capillary: 49 mg/dL — ABNORMAL LOW (ref 65–99)
Glucose-Capillary: 162 mg/dL — ABNORMAL HIGH (ref 65–99)
Glucose-Capillary: 74 mg/dL (ref 65–99)
Glucose-Capillary: 47 mg/dL — ABNORMAL LOW (ref 65–99)
Glucose-Capillary: 58 mg/dL — ABNORMAL LOW (ref 65–99)
Glucose-Capillary: 64 mg/dL — ABNORMAL LOW (ref 65–99)
Glucose-Capillary: 41 mg/dL — CL (ref 65–99)
Glucose-Capillary: 147 mg/dL — ABNORMAL HIGH (ref 65–99)
Glucose-Capillary: 66 mg/dL (ref 65–99)

## 2016-01-15 LAB — CREATININE, SERUM
Creatinine, Ser: 5.17 mg/dL — ABNORMAL HIGH (ref 0.61–1.24)
GFR calc Af Amer: 14 mL/min — ABNORMAL LOW (ref >60)
GFR calc non Af Amer: 12 mL/min — ABNORMAL LOW (ref >60)

## 2016-01-15 LAB — TRIGLYCERIDES: TRIGLYCERIDES: 203 mg/dL — AB (ref <150)

## 2016-01-15 LAB — VANCOMYCIN, RANDOM: Vancomycin Rm: 30 ug/mL

## 2016-01-15 LAB — LIPASE, BLOOD: LIPASE: 29 U/L (ref 11–51)

## 2016-01-15 MED ORDER — DEXTROSE-NACL 5-0.9 % IV SOLN
INTRAVENOUS | Status: DC
  Administered 2016-01-15: 19:00:00 via INTRAVENOUS

## 2016-01-15 MED ORDER — DEXTROSE 50 % IV SOLN
INTRAVENOUS | Status: AC
Start: 2016-01-15 — End: 2016-01-15
  Administered 2016-01-15: 50 mL
  Filled 2016-01-15: qty 50

## 2016-01-15 MED ORDER — DEXTROSE 50 % IV SOLN
INTRAVENOUS | Status: AC
Start: 2016-01-15 — End: 2016-01-15
  Administered 2016-01-15: 25 g
  Filled 2016-01-15: qty 50

## 2016-01-15 MED ORDER — DOCUSATE SODIUM 50 MG/5ML PO LIQD
100.0000 mg | Freq: Two times a day (BID) | ORAL | Status: DC
  Administered 2016-01-15 – 2016-01-19 (×9): 100 mg
  Filled 2016-01-15 (×9): qty 10

## 2016-01-15 MED ORDER — DEXTROSE 50 % IV SOLN
INTRAVENOUS | Status: AC
  Administered 2016-01-15: 50 mL
  Filled 2016-01-15: qty 50

## 2016-01-15 MED ORDER — SENNOSIDES 8.8 MG/5ML PO SYRP
5.0000 mL | ORAL_SOLUTION | Freq: Two times a day (BID) | ORAL | Status: DC
Start: 2016-01-15 — End: 2016-01-20
  Administered 2016-01-15 – 2016-01-19 (×9): 5 mL via ORAL
  Filled 2016-01-15 (×9): qty 5

## 2016-01-15 MED ORDER — DEXTROSE 50 % IV SOLN: 25.0000 g | Freq: Once | INTRAVENOUS | Status: AC

## 2016-01-15 MED ORDER — DEXTROSE 50 % IV SOLN
INTRAVENOUS | Status: AC
  Administered 2016-01-15: 20:00:00
  Filled 2016-01-15: qty 50

## 2016-01-15 MED ORDER — SODIUM CHLORIDE 0.9 % IV SOLN
200.0000 mg | Freq: Three times a day (TID) | INTRAVENOUS | Status: DC
  Administered 2016-01-15 – 2016-01-16 (×5): 200 mg via INTRAVENOUS
  Filled 2016-01-15 (×9): qty 4

## 2016-01-15 MED ORDER — DEXTROSE 50 % IV SOLN
25.0000 g | Freq: Once | INTRAVENOUS | Status: AC
  Administered 2016-01-15: 25 g via INTRAVENOUS

## 2016-01-15 NOTE — Progress Notes (Signed)
PULMONARY / CRITICAL CARE MEDICINE   Name: VERNIS BIAGIONI Sr. MRN: [id number] DOB: 07/30/1971    ADMISSION DATE:  01/12/2016  BRIEF HISTORY: 45 -year-old male past medical history of myocardial infarction, hypertension, coronary angioplasty with stent placement, recent admission for hypertensive urgency/acute cva, and it for unresponsiveness. History per chart review and family members at bedside, niece and mother. Chart review EMS was called late last night after patient awoke from sleep, walked a few steps then fell over became unresponsive and hit his head, upon arrival EMS did not have to perform any CPR. In the emergency department the patient appeared to have a posturing event of his extremities and loss of urine, thought to be seizure episode, given that he was posturing for less than a minute or so, he was intubated for airway protection. Head CT was negative for stroke or bleed. He was also noted to have EKG changes, and was started on heparin drip. Off note, he was admitted earlier this week for hypertensive urgency and possible TIA. Mother states that after discharge he could not fill his prescription due to financial reasons. Per chart review wife stated that they had both been drinking alcohol and smoking marijuana before accidental fall.  SUBJECTIVE:  Sedated on the vent, remains febrile Prognosis is guarded, hard to sedate, BP control   STUDIES: ECHO 4/16>>EF 55%, G1 diastolic dysfunction, mild MR, mild RA dilation  4/20>>alterd, fall, unresponsive, ett, UDS + for marijuana, benzos 4/20>>CVL placed, wire in RIJ, vascular retrieved wire via fluoro, new RIJ CVL placed.  4/20>> R Thalamic lucnar infarct  VITAL SIGNS: Temp:  [98.1 F (36.7 C)-102.1 F (38.9 C)] 99.8 F (37.7 C) (04/23 0400) Pulse Rate:  [80-116] 98 (04/23 0700) Resp:  [14-26] 24 (04/23 0700) BP: (79-236)/(55-123) 127/74 mmHg (04/23 0700) SpO2:  [83 %-100 %] 100 % (04/23 0700) FiO2 (%):  [40 %-100 %] 40 %  (04/23 0331) Weight:  [264 lb 15.9 oz (120.2 kg)-286 lb 2.5 oz (129.8 kg)] 286 lb 2.5 oz (129.8 kg) (04/23 0440) HEMODYNAMICS:   VENTILATOR SETTINGS: Vent Mode:  [-] PRVC FiO2 (%):  [40 %-100 %] 40 % Set Rate:  [18 bmp-26 bmp] 26 bmp Vt Set:  [550 mL] 550 mL PEEP:  [5 cmH20] 5 cmH20 Plateau Pressure:  [15 cmH20-18 cmH20] 18 cmH20 INTAKE / OUTPUT:  Intake/Output Summary (Last 24 hours) at 01/15/16 0800 Last data filed at 01/15/16 0600  Gross per 24 hour  Intake 1925.48 ml  Output   1300 ml  Net 625.48 ml    Review of Systems  Unable to perform ROS: intubated    Physical Exam  Constitutional: He is well-developed, well-nourished, and in no distress.  HENT:  Head: Normocephalic and atraumatic.  Right Ear: External ear normal.  Left Ear: External ear normal.  Eyes: Conjunctivae are normal. Pupils are equal, round, and reactive to light.  Neck: Neck supple.  Cardiovascular: Normal rate, regular rhythm, normal heart sounds and intact distal pulses.   No murmur heard. Pulmonary/Chest:  On MV, diminished BS  Abdominal: Soft. Bowel sounds are normal. He exhibits no distension.  Musculoskeletal: He exhibits no edema.  Neurological:  Sedated (mildly), withdraws to pain, no purposeful movement.   Skin: Skin is warm and dry.  Nursing note and vitals reviewed.    LABS:  CBC  Recent Labs Lab 01/12/16 0134 01/13/16 0601 01/14/16 0455  WBC 12.4* 13.5* 11.9*  HGB 15.2 13.0 13.2  HCT 45.3 39.2* 39.6*  PLT 317 213 75*  Coag's  Recent Labs Lab 01/12/16 0134  APTT 27  INR 1.10   BMET  Recent Labs Lab 01/12/16 1319 01/13/16 0601 01/14/16 0610 01/15/16 0559  NA 143 143 142  --   K 4.4 3.8 4.5  --   CL 110 114* 110  --   CO2 --   BUN 17 17 39*  --   CREATININE 1.27* 1.14 4.01* 5.17*  GLUCOSE 113* 162* 170*  --    Electrolytes  Recent Labs Lab 01/12/16 1319 01/13/16 0601 01/14/16 0610  CALCIUM 9.4 8.8* 8.2*   Sepsis Markers  Recent  Labs Lab 01/12/16 0141 01/12/16 0551 01/12/16 0807 01/13/16 0601 01/14/16 0455  LATICACIDVEN 5.5* 2.5*  --   --   --   PROCALCITON  --   --  <0.10 <0.10 10.73   ABG  Recent Labs Lab 01/12/16 0236 01/14/16 1620 01/14/16 2114  PHART 7.37 7.15* 7.29*  PCO2ART 33 59* 44  PO2ART 153* 191* 402*   Liver Enzymes  Recent Labs Lab 01/12/16 0134 01/13/16 0601  AST 28 25  ALT 27 20  ALKPHOS 95 61  BILITOT 0.6 0.7  ALBUMIN 4.5 3.4*   Cardiac Enzymes  Recent Labs Lab 01/12/16 0551 01/12/16 1319 01/12/16 1814  TROPONINI 0.04* 0.06* 0.10*   Glucose  Recent Labs Lab 01/14/16 2028 01/14/16 2349 01/14/16 2354 01/15/16 0349 01/15/16 0351 01/15/16 0725  GLUCAP 61* 39* 97 49* 114* 162*    Imaging Ct Head Wo Contrast  01/14/2016  CLINICAL DATA:  EMS was called late last night after patient awoke from sleep, walked a few steps then fell over became unresponsive and hit his head, upon arrival EMS did not have to perform any CPR. In the emergency department the patient appeared to have a posturing event of his extremities and loss of urine, thought to be seizure episode, given that he was posturing for less than a minute or so, he was intubated for airway protection. Head CT was negative for stroke or bleed. EXAM: CT HEAD WITHOUT CONTRAST TECHNIQUE: Contiguous axial images were obtained from the base of the skull through the vertex without intravenous contrast. COMPARISON:  01/12/2016 FINDINGS: The ventricles are normal in size and configuration. Small area of hypoattenuation along the superior right thalamus is noted consistent with a lacune infarct of unclear chronicity. This measures 6 mm in size, which is larger than on the prior CT. This may be subacute. No other evidence of an infarct. There are no parenchymal masses or mass effect, no extra-axial masses or abnormal fluid collections and no intracranial hemorrhage. Opacification of the visualize left maxillary sinus. Moderate  mucosal thickening with dependent fluid in several of the left ethmoid air cells. Dependent fluid mild mucosal thickening in sphenoid sinuses. Clear mastoid air cells. IMPRESSION: 1. Possible subacute versus chronic lacune infarct in the right thalamus, which appears larger than on the most recent prior CT. Followup brain MRI without contrast is recommended once this patient can tolerate the procedure. 2. No other intracranial abnormality. 3. Sinus disease as described. Fluid levels in the left ethmoid and bilateral sphenoid sinuses are new prior CT. Electronically Signed   By: Amie Portland M.D.   On: 01/14/2016 11:01   Dg Chest Port 1 View  01/14/2016  CLINICAL DATA:  Hypoxia EXAM: PORTABLE CHEST 1 VIEW COMPARISON:  Chest radiograph from earlier today. FINDINGS: Right internal jugular central venous catheter terminates at the cavoatrial junction. Endotracheal tube tip is 3.9 cm above the carina. Enteric  tube enters stomach with the tip not seen on this image. Stable cardiomediastinal silhouette with normal heart size. No pneumothorax. No pleural effusion. Slightly low lung volumes. No pulmonary edema. Mild hazy opacity at the right lung base appears increased. IMPRESSION: 1. Well-positioned support structures.  No pneumothorax . 2. Slightly low lung volumes. Increased mild hazy right basilar lung opacity, favor atelectasis, cannot exclude aspiration or developing pneumonia. Electronically Signed   By: Delbert Phenix M.D.   On: 01/14/2016 17:12   Cultures: BCx2 4/20>> UC 4/20>> Sputum 4/20>> MRSA POSITIVE  Antimicrobials this admission:  Vancomycin 4/21 >>  Zosyn 4/21 >>   Lines:LT IJ CVL  ASSESSMENT / PLAN: 45 year old male past medical history of hypertension, coronary artery disease, admitted for unresponsiveness and respiratory failure Found to have acute RT sided CVA-requiring full vent support  PULMONARY Respiratory failure-unresponsive VDRF Need for MV -Seizure Lactic acidosis R  Thalamic Lacunar infarct  P:  - cont with MV, wean as tolerated - maintain O2 sats >88% - SBT/SAT as tolerated - prn ABG - prn Bronchodilators - fentanyl/versed  - PCT>10.73 -Repeat cultures pending  CARDIOVASCULAR CVL RIJ A:  CAD HTN Hx of MI Hx of Coronary Angioplasty with stent placement-hemodynamically stable Possible NSTEMI P:  -monitor BP - RIJ with guide wire in place - s\p guide wire removal by vascular surgery, and placement of new RIJ CVL - maintain SBP<140 - PRN labetalol/hydralazine - cardiology following, mild troponin elevation, heparin gtt stopped  RENAL Elevated Creatinine P:  - BMP as needed - avoid nephrotoxic drugs - possibly due to chronic HTN   GASTROINTESTINAL Etoh Use Marijuana Use - PPI  HEMATOLOGIC -CBC  INFECTIOUS A: high fevers P:  - f\u cultures - EMPIRIC ABX -F/U PCT -check lipase  ENDOCRINE -ICU Hypo/hyperglycemia protocol  NEUROLOGIC A:  Unresponsiveness Possible Seizure episode.  R Thalamic Lacunar Infarct P:  RASS goal: -1,0 - neuro following - seizure precautions.  - AEDs (Dilantin) - BP control  -Neurologist will plan for Lumbar Puncture  Best Practice: Code Status:  Full. Diet: NPO GI prophylaxis:  PPI. VTE prophylaxis:  SCD's / F/U platelets, if normal  Schedule SQ heaprin   I have personally obtained a history, examined the patient, evaluated Pertinent laboratory and RadioGraphic/imaging results, and  formulated the assessment and plan   The Patient requires high complexity decision making for assessment and support, frequent evaluation and titration of therapies, application of advanced monitoring technologies and extensive interpretation of multiple databases. Critical Care Time devoted to patient care services described in this note is 35 minutes.   Overall, patient is critically ill, prognosis is guarded.  Patient with Multiorgan failure and at high risk for cardiac arrest and  death.  After further assessment, I would proceed with Trach and PEG tube placements, will discuss plan of action with wife  Lucie Leather, M.D.  [facility]  Medical Director [facility] Medical Director [facility] Cardio-Pulmonary Department

## 2016-01-15 NOTE — Progress Notes (Addendum)
Pharmacy Antibiotic Note  Duane Chen. is a 45 y.o. male admitted on 01/12/2016 with respiratory failure .  Pharmacy has been consulted for Vancomycin and Zosyn  dosing.  Plan Patient ordered vancomycin 1250mg  IV Q8H and zosyn 4.5gm IV Q8H.   Now with AKI SCr 1.14 >> 4.01  Will discontinue vancomycin and check trough at 1700 (when next dose would be due) Expect trough to be high due to AKI. May need to dose per levels until renal function improves.  Will also change zosyn dosing. With AKI will dose as though CrCl < 20 as cannot accurately estimate renal function in acute renal failure. Orders changed to zosyn 3.375gm IV q12H extended infusion.   Vancomycin trough resulted at 44. Vancomycin order already discontinued. Will recheck level in 12 hours.   4/23 AM vanc level 30, calculated kei 0.029 hr-1,  t1/2~ 24 hours. Recheck random vanc level tomorrow AM.   Height: 6\' 1"  (185.4 cm) Weight: 286 lb 2.5 oz (129.8 kg) IBW/kg (Calculated) : 79.9  Temp (24hrs), Avg:101 F (38.3 C), Min:98.1 F (36.7 C), Max:102.3 F (39.1 C)   Recent Labs Lab 01/12/16 0134 01/12/16 0141 01/12/16 0551 01/12/16 1319 01/13/16 0601 01/14/16 0455 01/14/16 0610 01/14/16 1643 01/15/16 0559  WBC 12.4*  --   --   --  13.5* 11.9*  --   --   --   CREATININE 1.30*  --   --  1.27* 1.14  --  4.01*  --  5.17*  LATICACIDVEN  --  5.5* 2.5*  --   --   --   --   --   --   VANCOTROUGH  --   --   --   --   --   --   --  51*  --   VANCORANDOM  --   --   --   --   --   --   --   --  30    Estimated Creatinine Clearance: 25.8 mL/min (by C-G formula based on Cr of 5.17).    No Known Allergies  Antimicrobials this admission:   Vancomycin 4/21 >>   Zosyn 4/21 >>   Dose adjustments this admission:   Microbiology results:  BCx:   UCx:    Sputum:   MRSA PCR: positive   Thank you for allowing pharmacy to be a part of this patient's care.  Tion Tse S 01/15/2016 6:43 AM

## 2016-01-15 NOTE — Progress Notes (Signed)
eLink Physician-Brief Progress Note Patient Name: Duane MAHANNAH Sr. DOB: 1970-09-29 MRN: [id number]   Date of Service  01/15/2016  HPI/Events of Note  Hypoglycemia - Blood glucose = 47 >> 58. Already on enteral nutrition.  eICU Interventions  Will change 0.9 NaCl IV infusion at 75 mL/hour to D5 0.9 NaCl at 75 mL/hour.      Intervention Category Major Interventions: Other:  Lenell Antu 01/15/2016, 6:03 PM

## 2016-01-15 NOTE — Progress Notes (Signed)
eLink Physician-Brief Progress Note Patient Name: Duane Chen. DOB: [birth year] MRN: [id number]   Date of Service  01/15/2016  HPI/Events of Note  Hypoglycemia - blood glucose = 41. Patient is on enteral nutrition and D5 0.9 NaCl at 75 mL/hour. No insulin on medication list.  eICU Interventions  Will increase D5 0.9 NaCl IV infusion to 125 mL/hour.      Intervention Category Major Interventions: Other:  Lenell Antu 01/15/2016, 8:04 PM

## 2016-01-15 NOTE — Progress Notes (Signed)
Pharmacy Note - Constipation Prevention  Patient intubated and sedated in CCU, pharmacy consulted for constipation prevention. Patient has not had BM since admission   Will order docusate 100mg  VT BID and Senna 40mL VT BID  Will continue to follow  Garlon Hatchet, PharmD Clinical Pharmacist  01/15/2016 1:45 PM

## 2016-01-15 NOTE — Plan of Care (Signed)
Problem: Education: Goal: Knowledge of disease or condition will improve Outcome: Progressing Teaching done with wife.  Dr Thad Ranger spoke with pt wife regarding CVA and possible scenarios of recovery  Problem: Tissue Perfusion: Goal: Cerebral tissue perfusion will improve applicable to all stroke diagnoses  Outcome: Not Progressing No seizure activity. No change in neuro status.  Wake up assessment deferred per Dr Belia Heman order. Pt has ome agitated episodes requiring fentanyl boluses.  These episodes include violent coughing and spastic flexion of extremities to noxious stimuli/ repositioning/ or suctioning. I do not believe pt withdraws to stimulation.  He has opened his eyes briefly to his name.  Goal: Complications of Intracerebral Hemorrhage will be minimized. Choose ONE based on patient diagnosis  Outcome: Progressing No evidence of nausea vomitting, pupils 2-3 equal and sluggish. Platlets 75 yesterday

## 2016-01-15 NOTE — Progress Notes (Signed)
Elink notified of patient  Continued low blood sugar, orders received, will continue to assess for changes/need.

## 2016-01-15 NOTE — Progress Notes (Signed)
Dr Arsenio Loader notified of glucose = 47 treated with one amp D50.  Glucose repeated 45 min later at 57. Second amp D50 given.  Await further orders

## 2016-01-15 NOTE — Progress Notes (Signed)
Subjective: Patient remains unresponsive.  On Dilantin and sedation.  Remains febrile.  Objective: Current vital signs: BP 140/82 mmHg  Pulse 98  Temp(Src) 102.3 F (39.1 C) (Rectal)  Resp 25  Ht _0  (1.854 m)  Wt 129.8 kg (286 lb 2.5 oz)  BMI 37.76 kg/m2  SpO2 98% Vital signs in last 24 hours: Temp:  [98.1 F (36.7 C)-102.3 F (39.1 C)] 102.3 F (39.1 C) (04/23 0800) Pulse Rate:  [80-116] 98 (04/23 1000) Resp:  [14-26] 25 (04/23 1000) BP: (79-236)/(55-123) 140/82 mmHg (04/23 1000) SpO2:  [83 %-100 %] 98 % (04/23 1000) FiO2 (%):  [30 %-100 %] 30 % (04/23 1000) Weight:  [120.2 kg (264 lb 15.9 oz)-129.8 kg (286 lb 2.5 oz)] 129.8 kg (286 lb 2.5 oz) (04/23 0440)  Intake/Output from previous day: 04/22 0701 - 04/23 0700 In: 3113.2 [I.V.:2016.5; NG/GT:593.7; IV Piggyback:503] Out: 1300 [Urine:1300] Intake/Output this shift: Total I/O In: 256 [I.V.:256] Out: 125 [Urine:125] Nutritional status: Diet NPO time specified Except for: Sips with Meds  Neurologic Exam: Mental Status: Patient does not respond to verbal stimuli. Does not respond to deep sternal rub. Does not follow commands. No verbalizations noted.  Cranial Nerves: II: patient does not respond confrontation bilaterally, pupils right 2 mm, left 2 mm,and unreactive bilaterally III,IV,VI: doll's response absent bilaterally.  V,VII: corneal reflex intact on the left and absent on the right VIII: patient does not respond to verbal stimuli IX,X: gag reflex reduced, XI: trapezius strength unable to test bilaterally XII: tongue strength unable to test Motor: Extremities flaccid throughout. No spontaneous movement noted. No purposeful movements noted. Sensory: Does not respond to noxious stimuli in any extremity.  Lab Results: Basic Metabolic Panel:  Recent Labs Lab 01/12/16 0134 01/12/16 1319 01/13/16 0601 01/14/16 0610 01/15/16 0559  NA 141 143 143 142  --   K 3.4* 4.4 3.8 4.5  --   CL 106 110 114*  110  --   CO2 _1 --   GLUCOSE 196* 113* 162* 170*  --   BUN _2 39*  --   CREATININE 1.30* 1.27* 1.14 4.01* 5.17*  CALCIUM 9.9 9.4 8.8* 8.2*  --     Liver Function Tests:  Recent Labs Lab 01/12/16 0134 01/13/16 0601  AST 28 25  ALT 27 20  ALKPHOS 95 61  BILITOT 0.6 0.7  PROT 8.3* 6.4*  ALBUMIN 4.5 3.4*    Recent Labs Lab 01/15/16 0559  LIPASE 29   No results for input(s): AMMONIA in the last 168 hours.  CBC:  Recent Labs Lab 01/12/16 0134 01/13/16 0601 01/14/16 0455  WBC 12.4* 13.5* 11.9*  NEUTROABS 5.1  --   --   HGB 15.2 13.0 13.2  HCT 45.3 39.2* 39.6*  MCV 91.1 92.2 92.6  PLT 317 213 75*    Cardiac Enzymes:  Recent Labs Lab 01/12/16 0134 01/12/16 0551 01/12/16 1319 01/12/16 1814  TROPONINI <0.03 0.04* 0.06* 0.10*    Lipid Panel:  Recent Labs Lab 01/12/16 0551 01/13/16 1809 01/15/16 0559  TRIG 368* 181* 203*    CBG:  Recent Labs Lab 01/14/16 2349 01/14/16 2354 01/15/16 0349 01/15/16 0351 01/15/16 0725  GLUCAP 39* 97 37* 114* 162*    Microbiology: Results for orders placed or performed during the hospital encounter of 01/12/16  MRSA PCR Screening     Status: Abnormal   Collection Time: 01/12/16  6:01 AM  Result Value Ref Range Status   MRSA by PCR POSITIVE (A) NEGATIVE Final  Comment:        The GeneXpert MRSA Assay (FDA approved for NASAL specimens only), is one component of a comprehensive MRSA colonization surveillance program. It is not intended to diagnose MRSA infection nor to guide or monitor treatment for MRSA infections. CRITICAL RESULT CALLED TO, READ BACK BY AND VERIFIED WITH: PAM MYERS 01/12/16 0751 SGD   Culture, blood (Routine X 2) w Reflex to ID Panel     Status: None (Preliminary result)   Collection Time: 01/12/16  4:54 PM  Result Value Ref Range Status   Specimen Description BLOOD LEFT HAND  Final   Special Requests BOTTLES DRAWN AEROBIC AND ANAEROBIC [place]  Final   Culture  NO GROWTH 3 DAYS  Final   Report Status PENDING  Incomplete  Culture, blood (Routine X 2) w Reflex to ID Panel     Status: None (Preliminary result)   Collection Time: 01/12/16  5:04 PM  Result Value Ref Range Status   Specimen Description BLOOD LEFT ARM  Final   Special Requests   Final    BOTTLES DRAWN AEROBIC AND ANAEROBIC  AERO 10CC ANA 5CC   Culture NO GROWTH 3 DAYS  Final   Report Status PENDING  Incomplete  Culture, expectorated sputum-assessment     Status: None   Collection Time: 01/14/16  4:26 AM  Result Value Ref Range Status   Specimen Description TRACHEAL ASPIRATE  Final   Special Requests NONE  Final   Sputum evaluation THIS SPECIMEN IS ACCEPTABLE FOR SPUTUM CULTURE  Final   Report Status 01/14/2016 FINAL  Final  Culture, respiratory (NON-Expectorated)     Status: None (Preliminary result)   Collection Time: 01/14/16  4:26 AM  Result Value Ref Range Status   Specimen Description TRACHEAL ASPIRATE  Final   Special Requests NONE Reflexed from [id number]  Final   Gram Stain FEW WBC SEEN FEW GRAM POSITIVE COCCI   Final   Culture HOLDING FOR POSSIBLE PATHOGEN  Final   Report Status PENDING  Incomplete  Culture, blood (Routine X 2) w Reflex to ID Panel     Status: None (Preliminary result)   Collection Time: 01/14/16  4:46 AM  Result Value Ref Range Status   Specimen Description BLOOD LEFT ASSIST CONTROL  Final   Special Requests   Final    BOTTLES DRAWN AEROBIC AND ANAEROBIC 15CCAERO,12CCANA   Culture NO GROWTH 1 DAY  Final   Report Status PENDING  Incomplete  Culture, blood (Routine X 2) w Reflex to ID Panel     Status: None (Preliminary result)   Collection Time: 01/14/16  5:06 AM  Result Value Ref Range Status   Specimen Description BLOOD RIGHT ASSIST CONTROL  Final   Special Requests   Final    BOTTLES DRAWN AEROBIC AND ANAEROBIC 12CCAERO,12CCANA   Culture NO GROWTH 1 DAY  Final   Report Status PENDING  Incomplete  Urine culture     Status: None   Collection Time:  01/14/16  6:28 AM  Result Value Ref Range Status   Specimen Description URINE, RANDOM  Final   Special Requests NONE  Final   Culture NO GROWTH 1 DAY  Final   Report Status 01/15/2016 FINAL  Final    Coagulation Studies: No results for input(s): LABPROT, INR in the last 72 hours.  Imaging: Ct Head Wo Contrast  01/14/2016  CLINICAL DATA:  EMS was called late last night after patient awoke from sleep, walked a few steps then fell over became unresponsive and hit  his head, upon arrival EMS did not have to perform any CPR. In the emergency department the patient appeared to have a posturing event of his extremities and loss of urine, thought to be seizure episode, given that he was posturing for less than a minute or so, he was intubated for airway protection. Head CT was negative for stroke or bleed. EXAM: CT HEAD WITHOUT CONTRAST TECHNIQUE: Contiguous axial images were obtained from the base of the skull through the vertex without intravenous contrast. COMPARISON:  01/12/2016 FINDINGS: The ventricles are normal in size and configuration. Small area of hypoattenuation along the superior right thalamus is noted consistent with a lacune infarct of unclear chronicity. This measures 6 mm in size, which is larger than on the prior CT. This may be subacute. No other evidence of an infarct. There are no parenchymal masses or mass effect, no extra-axial masses or abnormal fluid collections and no intracranial hemorrhage. Opacification of the visualize left maxillary sinus. Moderate mucosal thickening with dependent fluid in several of the left ethmoid air cells. Dependent fluid mild mucosal thickening in sphenoid sinuses. Clear mastoid air cells. IMPRESSION: 1. Possible subacute versus chronic lacune infarct in the right thalamus, which appears larger than on the most recent prior CT. Followup brain MRI without contrast is recommended once this patient can tolerate the procedure. 2. No other intracranial  abnormality. 3. Sinus disease as described. Fluid levels in the left ethmoid and bilateral sphenoid sinuses are new prior CT. Electronically Signed   By: Lajean Manes M.D.   On: 01/14/2016 11:01   Dg Chest Port 1 View  01/15/2016  CLINICAL DATA:  Acute Respiratory Failure. Follow up vent. EXAM: PORTABLE CHEST 1 VIEW COMPARISON:  01/14/2016 FINDINGS: Endotracheal tube, NG tube, and RIGHT central venous line are unchanged. Stable cardiac silhouette. No effusion, infiltrate or pneumothorax. IMPRESSION: 1. Stable support apparatus.  No interval change. Electronically Signed   By: Suzy Bouchard M.D.   On: 01/15/2016 10:08   Dg Chest Port 1 View  01/14/2016  CLINICAL DATA:  Hypoxia EXAM: PORTABLE CHEST 1 VIEW COMPARISON:  Chest radiograph from earlier today. FINDINGS: Right internal jugular central venous catheter terminates at the cavoatrial junction. Endotracheal tube tip is 3.9 cm above the carina. Enteric tube enters stomach with the tip not seen on this image. Stable cardiomediastinal silhouette with normal heart size. No pneumothorax. No pleural effusion. Slightly low lung volumes. No pulmonary edema. Mild hazy opacity at the right lung base appears increased. IMPRESSION: 1. Well-positioned support structures.  No pneumothorax . 2. Slightly low lung volumes. Increased mild hazy right basilar lung opacity, favor atelectasis, cannot exclude aspiration or developing pneumonia. Electronically Signed   By: Ilona Sorrel M.D.   On: 01/14/2016 17:12   Dg Chest Port 1 View  01/14/2016  CLINICAL DATA:  Acute respiratory failure.  History of hypertension. EXAM: PORTABLE CHEST 1 VIEW COMPARISON:  01/13/2016 FINDINGS: Lungs remain clear.  Cardiac silhouette is normal in size. No convincing pleural effusion.  No pneumothorax. Endotracheal tube, right internal jugular central venous line and nasal/ orogastric tube are stable in well positioned. IMPRESSION: 1. No acute findings in the lungs. 2. Support apparatus is  well positioned. 3. No change from prior study. Electronically Signed   By: Lajean Manes M.D.   On: 01/14/2016 07:47    Medications:  I have reviewed the patient's current medications. Scheduled: . antiseptic oral rinse  7 mL Mouth Rinse QID  . aspirin EC  81 mg Oral Daily  .  atorvastatin  40 mg Per Tube q1800  . chlorhexidine gluconate (SAGE KIT)  15 mL Mouth Rinse BID  . Chlorhexidine Gluconate Cloth  6 each Topical Q0600  . feeding supplement (PRO-STAT SUGAR FREE 64)  30 mL Per Tube BID  . free water  100 mL Per Tube Q8H  . mupirocin ointment  1 application Nasal BID  . pantoprazole (PROTONIX) IV  40 mg Intravenous Q24H  . phenytoin (DILANTIN) IV  200 mg Intravenous Q8H  . piperacillin-tazobactam (ZOSYN)  IV  3.375 g Intravenous Q12H    Assessment/Plan: Patient remains unresponsive.  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  This does not explain the patient's mental status.  Continues to be febrile.  Dilantin level subtherapeutic despite increase in maintenance dose.    Recommendations: 1.  Increase in maintenance Dilantin to 279m IV q 8 hours 2.  LP was recommended but platelet count 75 and procedure would be contraindicated at this time.  Would therefore recommend broad spectrum coverage with CNS penetration in mind.   3.  Dilantin level in AM  Case discussed with Dr. KMortimer Fries   LOS: 3 days   LAlexis Goodell MD Neurology [phone]/23/2017  10:47 AM

## 2016-01-16 ENCOUNTER — Inpatient Hospital Stay: Payer: Medicaid Other

## 2016-01-16 LAB — DIFFERENTIAL
BAND NEUTROPHILS: 0 %
BLASTS: 0 %
Basophils Absolute: 0.1 10*3/uL (ref 0–0.1)
Basophils Relative: 1 %
EOS ABS: 0.5 10*3/uL (ref 0–0.7)
Eosinophils Relative: 3 %
LYMPHS PCT: 6 %
Lymphs Abs: 1.2 10*3/uL (ref 1.0–3.6)
METAMYELOCYTES PCT: 0 %
MONO ABS: 0.8 10*3/uL (ref 0.2–1.0)
MONOS PCT: 4 %
MYELOCYTES: 0 %
NEUTROS PCT: 86 %
NRBC: 0 /100{WBCs}
Neutro Abs: 16 10*3/uL — ABNORMAL HIGH (ref 1.4–6.5)
Other: 0 %
Promyelocytes Absolute: 0 %

## 2016-01-16 LAB — BASIC METABOLIC PANEL
ANION GAP: 8 (ref 5–15)
BUN: 70 mg/dL — ABNORMAL HIGH (ref 6–20)
CHLORIDE: 115 mmol/L — AB (ref 101–111)
CO2: 19 mmol/L — AB (ref 22–32)
Calcium: 7.8 mg/dL — ABNORMAL LOW (ref 8.9–10.3)
Creatinine, Ser: 4.33 mg/dL — ABNORMAL HIGH (ref 0.61–1.24)
GFR calc non Af Amer: 15 mL/min — ABNORMAL LOW (ref >60)
GFR, EST AFRICAN AMERICAN: 18 mL/min — AB (ref >60)
Glucose, Bld: 277 mg/dL — ABNORMAL HIGH (ref 65–99)
POTASSIUM: 4.2 mmol/L (ref 3.5–5.1)
Sodium: 142 mmol/L (ref 135–145)

## 2016-01-16 LAB — PROTEIN / CREATININE RATIO, URINE
CREATININE, URINE: 86 mg/dL
Protein Creatinine Ratio: 1.14 mg/mg{Cre} — ABNORMAL HIGH (ref 0.00–0.15)
Total Protein, Urine: 98 mg/dL

## 2016-01-16 LAB — GLUCOSE, CAPILLARY
Comment 1: 0
Glucose-Capillary: 47 mg/dL — ABNORMAL LOW (ref 65–99)
Glucose-Capillary: 284 mg/dL — ABNORMAL HIGH (ref 65–99)
Glucose-Capillary: 207 mg/dL — ABNORMAL HIGH (ref 65–99)
Glucose-Capillary: 247 mg/dL — ABNORMAL HIGH (ref 65–99)
Glucose-Capillary: 240 mg/dL — ABNORMAL HIGH (ref 65–99)
Glucose-Capillary: 198 mg/dL — ABNORMAL HIGH (ref 65–99)

## 2016-01-16 LAB — HEPATIC FUNCTION PANEL
ALT: 1258 U/L — ABNORMAL HIGH (ref 17–63)
AST: 407 U/L — AB (ref 15–41)
Albumin: 2.3 g/dL — ABNORMAL LOW (ref 3.5–5.0)
Alkaline Phosphatase: 60 U/L (ref 38–126)
BILIRUBIN DIRECT: 0.3 mg/dL (ref 0.1–0.5)
BILIRUBIN INDIRECT: 0.5 mg/dL (ref 0.3–0.9)
BILIRUBIN TOTAL: 0.8 mg/dL (ref 0.3–1.2)
Total Protein: 5.6 g/dL — ABNORMAL LOW (ref 6.5–8.1)

## 2016-01-16 LAB — PHOSPHORUS: Phosphorus: 2.3 mg/dL — ABNORMAL LOW (ref 2.5–4.6)

## 2016-01-16 LAB — CULTURE, RESPIRATORY

## 2016-01-16 LAB — CULTURE, RESPIRATORY W GRAM STAIN

## 2016-01-16 LAB — CBC
HEMATOCRIT: 35.2 % — AB (ref 40.0–52.0)
HEMOGLOBIN: 11.6 g/dL — AB (ref 13.0–18.0)
MCH: 30.3 pg (ref 26.0–34.0)
MCHC: 32.9 g/dL (ref 32.0–36.0)
MCV: 92.3 fL (ref 80.0–100.0)
Platelets: 52 10*3/uL — ABNORMAL LOW (ref 150–440)
RBC: 3.82 MIL/uL — AB (ref 4.40–5.90)
RDW: 14.2 % (ref 11.5–14.5)
WBC: 18.1 10*3/uL — ABNORMAL HIGH (ref 3.8–10.6)

## 2016-01-16 LAB — EXPECTORATED SPUTUM ASSESSMENT W REFEX TO RESP CULTURE

## 2016-01-16 LAB — EXPECTORATED SPUTUM ASSESSMENT W GRAM STAIN, RFLX TO RESP C

## 2016-01-16 LAB — RAPID HIV SCREEN (HIV 1/2 AB+AG)
HIV 1/2 ANTIBODIES: NONREACTIVE
HIV-1 P24 ANTIGEN - HIV24: NONREACTIVE

## 2016-01-16 LAB — MAGNESIUM: MAGNESIUM: 2.2 mg/dL (ref 1.7–2.4)

## 2016-01-16 LAB — PHENYTOIN LEVEL, TOTAL: Phenytoin Lvl: 6.6 ug/mL — ABNORMAL LOW (ref 10.0–20.0)

## 2016-01-16 LAB — VANCOMYCIN, RANDOM: VANCOMYCIN RM: 12 ug/mL

## 2016-01-16 MED ORDER — VANCOMYCIN HCL 10 G IV SOLR
1250.0000 mg | Freq: Once | INTRAVENOUS | Status: AC
  Administered 2016-01-16: 1250 mg via INTRAVENOUS
  Filled 2016-01-16: qty 1250

## 2016-01-16 MED ORDER — SODIUM CHLORIDE 0.9 % IV SOLN
INTRAVENOUS | Status: DC
  Administered 2016-01-16: 12:00:00 via INTRAVENOUS

## 2016-01-16 MED ORDER — MEROPENEM 1 G IV SOLR
2.0000 g | Freq: Two times a day (BID) | INTRAVENOUS | Status: DC
  Administered 2016-01-17 (×3): 2 g via INTRAVENOUS
  Filled 2016-01-16 (×5): qty 2

## 2016-01-16 MED ORDER — DOXYCYCLINE HYCLATE 100 MG IV SOLR
100.0000 mg | Freq: Two times a day (BID) | INTRAVENOUS | Status: DC
  Administered 2016-01-16 – 2016-01-19 (×6): 100 mg via INTRAVENOUS
  Filled 2016-01-16 (×8): qty 100

## 2016-01-16 MED ORDER — STERILE WATER FOR INJECTION IJ SOLN
INTRAMUSCULAR | Status: AC
  Administered 2016-01-16: 10 mL
  Filled 2016-01-16: qty 10

## 2016-01-16 MED ORDER — CLONIDINE HCL 0.1 MG/24HR TD PTWK
0.1000 mg | MEDICATED_PATCH | TRANSDERMAL | Status: DC
  Administered 2016-01-16: 0.1 mg via TRANSDERMAL
  Filled 2016-01-16: qty 1

## 2016-01-16 MED ORDER — DEXTROSE 10 % IV SOLN
INTRAVENOUS | Status: DC
  Administered 2016-01-16: 01:00:00 via INTRAVENOUS

## 2016-01-16 MED ORDER — HYDRALAZINE HCL 20 MG/ML IJ SOLN
10.0000 mg | INTRAMUSCULAR | Status: DC | PRN
  Administered 2016-01-16 – 2016-01-17 (×2): 20 mg via INTRAVENOUS
  Administered 2016-01-19: 10 mg via INTRAVENOUS
  Administered 2016-01-19: 20 mg via INTRAVENOUS
  Filled 2016-01-16 (×4): qty 1

## 2016-01-16 MED ORDER — POLYETHYLENE GLYCOL 3350 17 G PO PACK
17.0000 g | PACK | Freq: Every day | ORAL | Status: DC
  Administered 2016-01-16 – 2016-01-17 (×2): 17 g
  Filled 2016-01-16 (×3): qty 1

## 2016-01-16 MED ORDER — DEXTROSE-NACL 5-0.9 % IV SOLN
INTRAVENOUS | Status: DC
  Administered 2016-01-16: 04:00:00 via INTRAVENOUS

## 2016-01-16 MED ORDER — DIATRIZOATE MEGLUMINE & SODIUM 66-10 % PO SOLN
15.0000 mL | ORAL | Status: AC
  Administered 2016-01-16 (×2): 15 mL via ORAL

## 2016-01-16 MED ORDER — PIPERACILLIN-TAZOBACTAM 3.375 G IVPB
3.3750 g | Freq: Three times a day (TID) | INTRAVENOUS | Status: DC
  Administered 2016-01-16: 3.375 g via INTRAVENOUS
  Filled 2016-01-16 (×4): qty 50

## 2016-01-16 MED ORDER — NICARDIPINE HCL IN NACL 20-0.86 MG/200ML-% IV SOLN
3.0000 mg/h | INTRAVENOUS | Status: DC
  Administered 2016-01-18 (×2): 7.5 mg/h via INTRAVENOUS
  Administered 2016-01-18 (×2): 5 mg/h via INTRAVENOUS
  Administered 2016-01-18: 7.5 mg/h via INTRAVENOUS
  Administered 2016-01-19: 10 mg/h via INTRAVENOUS
  Administered 2016-01-19: 5 mg/h via INTRAVENOUS
  Administered 2016-01-19: 3 mg/h via INTRAVENOUS
  Administered 2016-01-19: 10 mg/h via INTRAVENOUS
  Filled 2016-01-16 (×11): qty 200

## 2016-01-16 MED ORDER — HYDRALAZINE HCL 20 MG/ML IJ SOLN: 10.0000 mg | INTRAMUSCULAR | Status: DC | PRN

## 2016-01-16 MED ORDER — DIAZEPAM 5 MG PO TABS: 5.0000 mg | ORAL_TABLET | Freq: Four times a day (QID) | ORAL | Status: DC

## 2016-01-16 MED ORDER — DIAZEPAM 5 MG PO TABS
5.0000 mg | ORAL_TABLET | Freq: Four times a day (QID) | ORAL | Status: DC
  Administered 2016-01-16 – 2016-01-19 (×16): 5 mg via NASOGASTRIC
  Filled 2016-01-16 (×16): qty 1

## 2016-01-16 NOTE — Consult Note (Signed)
[facility] Infectious Disease     Reason for Consult:Fever, AMS    Referring Physician: Beatrix Shipper Date of Admission:  01/12/2016   Active Problems:   Respiratory distress   Seizure-like activity (HCC)   Acute respiratory failure with hypoxia (HCC)   Hypertensive encephalopathy   Respiratory failure (HCC)   HPI: Duane TRICKETT Sr. is a 45 y.o. male admitted 4.20 after he fell unresponsive after arising from bed and hitting his head. Per his wife he was admitted a few days prior for TIA type sxs- had neg CT head and neg Carotids - was dced after achieving improved BP control on new meds.  He was unable to fill his new BP meds. She also reports he was having sxs of bright flashes of light when turning his head and he would notice a strange smell when he turned.  He was not apparently reporting headaches and had no fevers or chills. He had been doing relatively well prior but had apparently buried the family dog about 2 weeks ago after it was found suddenly dead under the porch.  His wife does not know where he buried the dog as she was not home but her daughter told her there was blood and pus coming from the dogs mouth.  He has had no travel, no exposure to other animals or swimming recently.  Since admit he had to be intubated for airway protection. He had possible seizure activity and was started on dilantin. His BP has flucuated as high as 202/154 to as low as 83/46.  He on admit had no fever and wbc of 12.4 but oin 4/2 developed high fever to 105 and has been febrile daily since then. Started vanco and zosyn on 4/22.  He has since developed arf with cr up to 5, TCP with plts down to 52. WBC has increased to 18 from 12. ALT has also increased remarkable to 1200.   BCX have been neg, sputum cx with MSSA. UCX neg. CXR suggests CHF but no infitlrate.  CT showed probable recent small lacunar stroke.    Past Medical History  Diagnosis Date  . MI (myocardial infarction) ([place])   .  Hypertension    Past Surgical History  Procedure Laterality Date  . Coronary angioplasty with stent placement    . Peripheral vascular catheterization N/A 01/12/2016    Procedure: Foreign Body Retrieval, intravascular;  Surgeon: Algernon Huxley, MD;  Location: [facility];  Service: Cardiovascular;  Laterality: N/A;   Social History  Substance Use Topics  . Smoking status: Current Every Day Smoker -- 0.50 packs/day    Types: Cigarettes  . Smokeless tobacco: Never Used  . Alcohol Use: Yes     Comment: 6pk/week   Family History  Problem Relation Age of Onset  . Seizures Sister   . Diabetes Mellitus II Mother   . Heart disease Father     Allergies: No Known Allergies  Current antibiotics: Antibiotics Given (last 72 hours)    Date/Time Action Medication Dose Rate   01/13/16 2000 Given   vancomycin (VANCOCIN) 1,250 mg in sodium chloride 0.9 % 250 mL IVPB 1,250 mg 166.7 mL/hr   01/13/16 2200 Given   piperacillin-tazobactam (ZOSYN) IVPB 4.5 g 4.5 g 200 mL/hr   01/14/16 0200 Given   vancomycin (VANCOCIN) 1,250 mg in sodium chloride 0.9 % 250 mL IVPB 1,250 mg 166.7 mL/hr   01/14/16 0600 Given   piperacillin-tazobactam (ZOSYN) IVPB 4.5 g 4.5 g 200 mL/hr  01/14/16 1030 Given   vancomycin (VANCOCIN) 1,250 mg in sodium chloride 0.9 % 250 mL IVPB 1,250 mg 166.7 mL/hr   01/14/16 2200 Given   piperacillin-tazobactam (ZOSYN) IVPB 3.375 g 3.375 g 12.5 mL/hr   01/15/16 1035 Given   piperacillin-tazobactam (ZOSYN) IVPB 3.375 g 3.375 g 12.5 mL/hr   01/15/16 2205 Given   piperacillin-tazobactam (ZOSYN) IVPB 3.375 g 3.375 g 12.5 mL/hr   01/16/16 0707 Given   vancomycin (VANCOCIN) 1,250 mg in sodium chloride 0.9 % 250 mL IVPB 1,250 mg 166.7 mL/hr   01/16/16 1017 Given   piperacillin-tazobactam (ZOSYN) IVPB 3.375 g 3.375 g 12.5 mL/hr      MEDICATIONS: . antiseptic oral rinse  7 mL Mouth Rinse QID  . aspirin EC  81 mg Oral Daily  . atorvastatin  40 mg Per Tube q1800  . chlorhexidine  gluconate (SAGE KIT)  15 mL Mouth Rinse BID  . Chlorhexidine Gluconate Cloth  6 each Topical Q0600  . cloNIDine  0.1 mg Transdermal Weekly  . diazepam  5 mg Per NG tube Q6H  . docusate  100 mg Per Tube BID  . feeding supplement (PRO-STAT SUGAR FREE 64)  30 mL Per Tube BID  . free water  100 mL Per Tube Q8H  . mupirocin ointment  1 application Nasal BID  . pantoprazole (PROTONIX) IV  40 mg Intravenous Q24H  . phenytoin (DILANTIN) IV  200 mg Intravenous Q8H  . piperacillin-tazobactam (ZOSYN)  IV  3.375 g Intravenous Q8H  . sennosides  5 mL Oral BID    Review of Systems - unable to obtain    OBJECTIVE: Temp:  [98.6 F (37 C)-102.8 F (39.3 C)] 102.7 F (39.3 C) (04/24 0800) Pulse Rate:  [65-132] 82 (04/24 1400) Resp:  [21-37] 24 (04/24 1400) BP: (125-254)/(85-154) 125/89 mmHg (04/24 1400) SpO2:  [63 %-100 %] 100 % (04/24 1400) FiO2 (%):  [30 %-40 %] 40 % (04/24 1300) Weight:  [127.8 kg (281 lb 12 oz)] 127.8 kg (281 lb 12 oz) (04/24 0600) Physical Exam  Constitutional: crtically ill appearing, unresponsive even to sternal rub, on fentanyl versed HENT: anicteric pupils pinpoint Mouth/Throat: Oropharynx ett and ogt in place  Neck is supple  Cardiovascular: tachy, reg Pulmonary/Chest: Effort normal and breath sounds normal.  Abdominal: Soft. Bowel sounds are normal. He exhibits no distension. There is no tenderness.  Lymphadenopathy: He has no cervical adenopathy.  Neurological: unresponsive, no clonus,  Skin: Skin is warm and dry. No rash noted. No erythema.  Psychiatric: unresponisive Lines- R IJ wnl Foley in place   LABS: Results for orders placed or performed during the hospital encounter of 01/12/16 (from the past 48 hour(s))  Blood gas, arterial     Status: Abnormal   Collection Time: 01/14/16  4:20 PM  Result Value Ref Range   FIO2 1.00    Delivery systems VENTILATOR    Mode PRESSURE REGULATED VOLUME CONTROL    VT 550 mL   LHR 18 resp/min   Peep/cpap 5.0 cm H20    pH, Arterial 7.15 (LL) 7.350 - 7.450    Comment: CRITICAL RESULT CALLED TO, READ BACK BY AND VERIFIED WITH: TO CHARLIE FLEETWOOD, RN AT 1717 ON 01/14/16 BY AGP    pCO2 arterial 59 (H) 32.0 - 48.0 mmHg   pO2, Arterial 191 (H) 83.0 - 108.0 mmHg   Bicarbonate 20.3 (L) 21.0 - 28.0 mEq/L   Acid-base deficit 9.0 (H) 0.0 - 2.0 mmol/L   O2 Saturation 99.4 %   Patient temperature 37.9  Collection site LEFT RADIAL    Sample type ARTERIAL DRAW    Allens test (pass/fail) POSITIVE (A) PASS  Vancomycin, trough     Status: Abnormal   Collection Time: 01/14/16  4:43 PM  Result Value Ref Range   Vancomycin Tr 44 (HH) 10 - 20 ug/mL    Comment: CRITICAL RESULT CALLED TO, READ BACK BY AND VERIFIED WITH MELISSA MACCIA ON 01/14/16 AT 1710 [place]   Glucose, capillary     Status: Abnormal   Collection Time: 01/14/16  6:47 PM  Result Value Ref Range   Glucose-Capillary 136 (H) 65 - 99 mg/dL  Glucose, capillary     Status: Abnormal   Collection Time: 01/14/16  8:28 PM  Result Value Ref Range   Glucose-Capillary 61 (L) 65 - 99 mg/dL  Blood gas, arterial     Status: Abnormal   Collection Time: 01/14/16  9:14 PM  Result Value Ref Range   FIO2 1.00    Delivery systems VENTILATOR    Mode PRESSURE REGULATED VOLUME CONTROL    VT 550 mL   Peep/cpap 8.0 cm H20    Comment: 8.0   pH, Arterial 7.29 (L) 7.350 - 7.450   pCO2 arterial 44 32.0 - 48.0 mmHg   pO2, Arterial 402 (H) 83.0 - 108.0 mmHg   Bicarbonate 21.2 21.0 - 28.0 mEq/L   Acid-base deficit 5.3 (H) 0.0 - 2.0 mmol/L   O2 Saturation 100.0 %   Patient temperature 37.0    Collection site RIGHT BRACHIAL    Sample type ARTERIAL DRAW    Allens test (pass/fail) PASS PASS   Mechanical Rate 26   Glucose, capillary     Status: Abnormal   Collection Time: 01/14/16 11:49 PM  Result Value Ref Range   Glucose-Capillary 39 (LL) 65 - 99 mg/dL   Comment 1 Notify RN   Glucose, capillary     Status: None   Collection Time: 01/14/16 11:54 PM  Result Value Ref  Range   Glucose-Capillary 97 65 - 99 mg/dL  Glucose, capillary     Status: Abnormal   Collection Time: 01/15/16  3:49 AM  Result Value Ref Range   Glucose-Capillary 49 (L) 65 - 99 mg/dL  Glucose, capillary     Status: Abnormal   Collection Time: 01/15/16  3:51 AM  Result Value Ref Range   Glucose-Capillary 114 (H) 65 - 99 mg/dL  Phenytoin level, total     Status: Abnormal   Collection Time: 01/15/16  5:59 AM  Result Value Ref Range   Phenytoin Lvl 6.0 (L) 10.0 - 20.0 ug/mL  Creatinine, serum     Status: Abnormal   Collection Time: 01/15/16  5:59 AM  Result Value Ref Range   Creatinine, Ser 5.17 (H) 0.61 - 1.24 mg/dL   GFR calc non Af Amer 12 (L) >60 mL/min   GFR calc Af Amer 14 (L) >60 mL/min    Comment: (NOTE) The eGFR has been calculated using the CKD EPI equation. This calculation has not been validated in all clinical situations. eGFR's persistently <60 mL/min signify possible Chronic Kidney Disease.   Vancomycin, random     Status: None   Collection Time: 01/15/16  5:59 AM  Result Value Ref Range   Vancomycin Rm 30 ug/mL    Comment:        Random Vancomycin therapeutic range is dependent on dosage and time of specimen collection. A peak range is 20.0-40.0 ug/mL A trough range is 5.0-15.0 ug/mL  Triglycerides     Status: Abnormal   Collection Time: 01/15/16  5:59 AM  Result Value Ref Range   Triglycerides 203 (H) <150 mg/dL  Lipase, blood     Status: None   Collection Time: 01/15/16  5:59 AM  Result Value Ref Range   Lipase 29 11 - 51 U/L  Glucose, capillary     Status: Abnormal   Collection Time: 01/15/16  7:25 AM  Result Value Ref Range   Glucose-Capillary 162 (H) 65 - 99 mg/dL  Glucose, capillary     Status: None   Collection Time: 01/15/16 11:16 AM  Result Value Ref Range   Glucose-Capillary 74 65 - 99 mg/dL  Glucose, capillary     Status: Abnormal   Collection Time: 01/15/16  4:26 PM  Result Value Ref Range   Glucose-Capillary 47 (L) 65 - 99  mg/dL  Glucose, capillary     Status: Abnormal   Collection Time: 01/15/16  5:16 PM  Result Value Ref Range   Glucose-Capillary 58 (L) 65 - 99 mg/dL  Glucose, capillary     Status: Abnormal   Collection Time: 01/15/16  7:17 PM  Result Value Ref Range   Glucose-Capillary 64 (L) 65 - 99 mg/dL  Glucose, capillary     Status: Abnormal   Collection Time: 01/15/16  7:57 PM  Result Value Ref Range   Glucose-Capillary 41 (LL) 65 - 99 mg/dL  Glucose, capillary     Status: Abnormal   Collection Time: 01/15/16  8:59 PM  Result Value Ref Range   Glucose-Capillary 147 (H) 65 - 99 mg/dL  Glucose, capillary     Status: None   Collection Time: 01/15/16 11:23 PM  Result Value Ref Range   Glucose-Capillary 66 65 - 99 mg/dL  Glucose, capillary     Status: Abnormal   Collection Time: 01/16/16  1:26 AM  Result Value Ref Range   Glucose-Capillary 47 (L) 65 - 99 mg/dL  Glucose, capillary     Status: Abnormal   Collection Time: 01/16/16  1:31 AM  Result Value Ref Range   Glucose-Capillary 284 (H) 65 - 99 mg/dL  Basic metabolic panel     Status: Abnormal   Collection Time: 01/16/16  2:37 AM  Result Value Ref Range   Sodium 142 135 - 145 mmol/L   Potassium 4.2 3.5 - 5.1 mmol/L   Chloride 115 (H) 101 - 111 mmol/L   CO2 19 (L) 22 - 32 mmol/L   Glucose, Bld 277 (H) 65 - 99 mg/dL   BUN 70 (H) 6 - 20 mg/dL   Creatinine, Ser 4.33 (H) 0.61 - 1.24 mg/dL   Calcium 7.8 (L) 8.9 - 10.3 mg/dL   GFR calc non Af Amer 15 (L) >60 mL/min   GFR calc Af Amer 18 (L) >60 mL/min    Comment: (NOTE) The eGFR has been calculated using the CKD EPI equation. This calculation has not been validated in all clinical situations. eGFR's persistently <60 mL/min signify possible Chronic Kidney Disease.    Anion gap 8 5 - 15  Vancomycin, random     Status: None   Collection Time: 01/16/16  5:14 AM  Result Value Ref Range   Vancomycin Rm 12 ug/mL    Comment:        Random Vancomycin therapeutic range is dependent on dosage  and time of specimen collection. A peak range is 20.0-40.0 ug/mL A trough range is 5.0-15.0 ug/mL          CBC  Status: Abnormal   Collection Time: 01/16/16  5:14 AM  Result Value Ref Range   WBC 18.1 (H) 3.8 - 10.6 K/uL   RBC 3.82 (L) 4.40 - 5.90 MIL/uL   Hemoglobin 11.6 (L) 13.0 - 18.0 g/dL   HCT 35.2 (L) 40.0 - 52.0 %   MCV 92.3 80.0 - 100.0 fL   MCH 30.3 26.0 - 34.0 pg   MCHC 32.9 32.0 - 36.0 g/dL   RDW 14.2 11.5 - 14.5 %   Platelets 52 (L) 150 - 440 K/uL  Magnesium     Status: None   Collection Time: 01/16/16  5:14 AM  Result Value Ref Range   Magnesium 2.2 1.7 - 2.4 mg/dL  Phosphorus     Status: Abnormal   Collection Time: 01/16/16  5:14 AM  Result Value Ref Range   Phosphorus 2.3 (L) 2.5 - 4.6 mg/dL  Phenytoin level, total     Status: Abnormal   Collection Time: 01/16/16  5:14 AM  Result Value Ref Range   Phenytoin Lvl 6.6 (L) 10.0 - 20.0 ug/mL  Glucose, capillary     Status: Abnormal   Collection Time: 01/16/16  7:29 AM  Result Value Ref Range   Glucose-Capillary 207 (H) 65 - 99 mg/dL   Comment 1 Notify RN   Glucose, capillary     Status: Abnormal   Collection Time: 01/16/16 11:39 AM  Result Value Ref Range   Glucose-Capillary 247 (H) 65 - 99 mg/dL  Culture, expectorated sputum-assessment     Status: None   Collection Time: 01/16/16 12:10 PM  Result Value Ref Range   Specimen Description TRACHEAL ASPIRATE    Special Requests NONE    Sputum evaluation THIS SPECIMEN IS ACCEPTABLE FOR SPUTUM CULTURE    Report Status 01/16/2016 FINAL   Protein / creatinine ratio, urine     Status: Abnormal   Collection Time: 01/16/16 12:12 PM  Result Value Ref Range   Creatinine, Urine 86 mg/dL   Total Protein, Urine 98 mg/dL    Comment: NO NORMAL RANGE ESTABLISHED FOR THIS TEST   Protein Creatinine Ratio 1.14 (H) 0.00 - 0.15 mg/mg[Cre]   No components found for: ESR, C REACTIVE PROTEIN MICRO: Recent Results (from the past 720 hour(s))  MRSA PCR Screening     Status:  Abnormal   Collection Time: 01/12/16  6:01 AM  Result Value Ref Range Status   MRSA by PCR POSITIVE (A) NEGATIVE Final    Comment:        The GeneXpert MRSA Assay (FDA approved for NASAL specimens only), is one component of a comprehensive MRSA colonization surveillance program. It is not intended to diagnose MRSA infection nor to guide or monitor treatment for MRSA infections. CRITICAL RESULT CALLED TO, READ BACK BY AND VERIFIED WITH: PAM MYERS 01/12/16 0751 SGD   Culture, blood (Routine X 2) w Reflex to ID Panel     Status: None (Preliminary result)   Collection Time: 01/12/16  4:54 PM  Result Value Ref Range Status   Specimen Description BLOOD LEFT HAND  Final   Special Requests BOTTLES DRAWN AEROBIC AND ANAEROBIC Pearson  Final   Culture NO GROWTH 3 DAYS  Final   Report Status PENDING  Incomplete  Culture, blood (Routine X 2) w Reflex to ID Panel     Status: None (Preliminary result)   Collection Time: 01/12/16  5:04 PM  Result Value Ref Range Status   Specimen Description BLOOD LEFT ARM  Final   Special Requests   Final  BOTTLES DRAWN AEROBIC AND ANAEROBIC  AERO 10CC ANA 5CC   Culture NO GROWTH 3 DAYS  Final   Report Status PENDING  Incomplete  Culture, expectorated sputum-assessment     Status: None   Collection Time: 01/14/16  4:26 AM  Result Value Ref Range Status   Specimen Description TRACHEAL ASPIRATE  Final   Special Requests NONE  Final   Sputum evaluation THIS SPECIMEN IS ACCEPTABLE FOR SPUTUM CULTURE  Final   Report Status 01/14/2016 FINAL  Final  Culture, respiratory (NON-Expectorated)     Status: None   Collection Time: 01/14/16  4:26 AM  Result Value Ref Range Status   Specimen Description TRACHEAL ASPIRATE  Final   Special Requests NONE Reflexed from [id number]  Final   Gram Stain FEW WBC SEEN FEW GRAM POSITIVE COCCI   Final   Culture MODERATE GROWTH STAPHYLOCOCCUS AUREUS  Final   Report Status 01/16/2016 FINAL  Final   Organism ID, Bacteria  STAPHYLOCOCCUS AUREUS  Final      Susceptibility   Staphylococcus aureus - MIC*    CIPROFLOXACIN <=0.5 SENSITIVE Sensitive     ERYTHROMYCIN 2 RESISTANT Resistant     GENTAMICIN <=0.5 SENSITIVE Sensitive     OXACILLIN 0.5 SENSITIVE Sensitive     TETRACYCLINE <=1 SENSITIVE Sensitive     VANCOMYCIN 1 SENSITIVE Sensitive     TRIMETH/SULFA <=10 SENSITIVE Sensitive     CLINDAMYCIN <=0.25 RESISTANT Resistant     RIFAMPIN <=0.5 SENSITIVE Sensitive     Inducible Clindamycin Value in next row Resistant      POSITIVEINDUCIBLE CLINDAMYCIN RESISTANCE - A positive ICR test is indicative of inducible resistance to macrolides, lincosamides, and type B streptogramin.  This isolate is presumed to be resistant to Clindamycin, however, Clindamycin may still be effective in some patients.     * MODERATE GROWTH STAPHYLOCOCCUS AUREUS  Culture, blood (Routine X 2) w Reflex to ID Panel     Status: None (Preliminary result)   Collection Time: 01/14/16  4:46 AM  Result Value Ref Range Status   Specimen Description BLOOD LEFT ASSIST CONTROL  Final   Special Requests   Final    BOTTLES DRAWN AEROBIC AND ANAEROBIC 15CCAERO,12CCANA   Culture NO GROWTH 2 DAYS  Final   Report Status PENDING  Incomplete  Culture, blood (Routine X 2) w Reflex to ID Panel     Status: None (Preliminary result)   Collection Time: 01/14/16  5:06 AM  Result Value Ref Range Status   Specimen Description BLOOD RIGHT ASSIST CONTROL  Final   Special Requests   Final    BOTTLES DRAWN AEROBIC AND ANAEROBIC 12CCAERO,12CCANA   Culture NO GROWTH 1 DAY  Final   Report Status PENDING  Incomplete  Urine culture     Status: None   Collection Time: 01/14/16  6:28 AM  Result Value Ref Range Status   Specimen Description URINE, RANDOM  Final   Special Requests NONE  Final   Culture NO GROWTH 1 DAY  Final   Report Status 01/15/2016 FINAL  Final  Culture, expectorated sputum-assessment     Status: None   Collection Time: 01/16/16 12:10 PM  Result  Value Ref Range Status   Specimen Description TRACHEAL ASPIRATE  Final   Special Requests NONE  Final   Sputum evaluation THIS SPECIMEN IS ACCEPTABLE FOR SPUTUM CULTURE  Final   Report Status 01/16/2016 FINAL  Final    IMAGING: Dg Chest 1 View  01/12/2016  CLINICAL DATA:  Status post right IJ catheter  placement today. Initial encounter. EXAM: CHEST 1 VIEW COMPARISON:  CT chest 05/05/2010. Single view of the chest 08/02/2014. FINDINGS: The patient has a new right IJ catheter. The catheter is kinked just below the clavicular head and is likely directed toward the azygos. A wire or lead extends across the right side of the heart and appears to contact the patient's right IJ catheter. This could be external to the patient or could be a wire related to IJ catheter placement. NG tube courses into the stomach. NG tube courses into the stomach. Lungs are clear. Heart size is normal. No pneumothorax or pleural effusion. IMPRESSION: Right IJ catheter tip appears directed toward the azygos. Negative for pneumothorax. Wire or lead projecting over the right side of the heart and the distal aspect the patient's IJ catheter. This case was discussed with the patient's nurse at the time interpretation. She indicated the patient is undergoing an EEG and that this may represent a lead related to that procedure. Repeat chest film immediately after the patient's EEG is recommended to exclude this density representing the wire related IJ catheter placement. Electronically Signed   By: Inge Rise M.D.   On: 01/12/2016 13:02   Dg Abd 1 View  01/12/2016  CLINICAL DATA:  OG tube placement. EXAM: ABDOMEN - 1 VIEW COMPARISON:  None. FINDINGS: Enteric tube tip in the left upper quadrant consistent with location in the mid stomach. Visualized bowel gas pattern is unremarkable. IMPRESSION: Enteric tube tip in the left upper quadrant consistent with location in the mid stomach. Electronically Signed   By: Lucienne Capers M.D.    On: 01/12/2016 02:23   Ct Head Wo Contrast  01/14/2016  CLINICAL DATA:  EMS was called late last night after patient awoke from sleep, walked a few steps then fell over became unresponsive and hit his head, upon arrival EMS did not have to perform any CPR. In the emergency department the patient appeared to have a posturing event of his extremities and loss of urine, thought to be seizure episode, given that he was posturing for less than a minute or so, he was intubated for airway protection. Head CT was negative for stroke or bleed. EXAM: CT HEAD WITHOUT CONTRAST TECHNIQUE: Contiguous axial images were obtained from the base of the skull through the vertex without intravenous contrast. COMPARISON:  01/12/2016 FINDINGS: The ventricles are normal in size and configuration. Small area of hypoattenuation along the superior right thalamus is noted consistent with a lacune infarct of unclear chronicity. This measures 6 mm in size, which is larger than on the prior CT. This may be subacute. No other evidence of an infarct. There are no parenchymal masses or mass effect, no extra-axial masses or abnormal fluid collections and no intracranial hemorrhage. Opacification of the visualize left maxillary sinus. Moderate mucosal thickening with dependent fluid in several of the left ethmoid air cells. Dependent fluid mild mucosal thickening in sphenoid sinuses. Clear mastoid air cells. IMPRESSION: 1. Possible subacute versus chronic lacune infarct in the right thalamus, which appears larger than on the most recent prior CT. Followup brain MRI without contrast is recommended once this patient can tolerate the procedure. 2. No other intracranial abnormality. 3. Sinus disease as described. Fluid levels in the left ethmoid and bilateral sphenoid sinuses are new prior CT. Electronically Signed   By: Lajean Manes M.D.   On: 01/14/2016 11:01   Ct Head Wo Contrast  01/12/2016  CLINICAL DATA:  Visual disturbances, found  unresponsive with LEFT facial  droop. History of hypertension and myocardial infarction. EXAM: CT HEAD WITHOUT CONTRAST TECHNIQUE: Contiguous axial images were obtained from the base of the skull through the vertex without intravenous contrast. COMPARISON:  None. FINDINGS: Mildly motion degraded examination. INTRACRANIAL CONTENTS: The ventricles and sulci are normal. No intraparenchymal hemorrhage, mass effect nor midline shift. No acute large vascular territory infarcts. RIGHT thalamus lacunar infarct. No abnormal extra-axial fluid collections. Basal cisterns are patent. Mild calcific atherosclerosis of the carotid siphons. ORBITS: The included ocular globes and orbital contents are normal. SINUSES: Soft tissue expands the LEFT maxillary sinus with mucoperiosteal reaction. Mastoid air cells are well aerated. SKULL/SOFT TISSUES: No skull fracture. No significant soft tissue swelling. Poor dentition. Life-support lines in place. IMPRESSION: Age indeterminate RIGHT thalamus lacunar infarct. LEFT maxillary mucocele. Acute findings discussed with and reconfirmed by Dr.JADE SUNG on 01/12/2016 at 2:10 am. Electronically Signed   By: Elon Alas M.D.   On: 01/12/2016 02:16   Ct Head Wo Contrast  01/07/2016  CLINICAL DATA:  45 year old male with history of sudden onset of left-sided weakness and numbness at 7:30 a.m. this morning, progressively worsening throughout the day. Slurred speech. Code stroke. EXAM: CT HEAD WITHOUT CONTRAST TECHNIQUE: Contiguous axial images were obtained from the base of the skull through the vertex without intravenous contrast. COMPARISON:  No priors. FINDINGS: No acute intracranial abnormalities. Specifically, no evidence of acute intracranial hemorrhage, no definite findings of acute/subacute cerebral ischemia, no mass, mass effect, hydrocephalus or abnormal intra or extra-axial fluid collections. Left maxillary sinus is completely opacified with mucoperiosteal thickening and some  expansion and medial sinus wall, suggesting an mucocele. Visualized paranasal sinuses and mastoids are otherwise well pneumatized. No acute displaced skull fractures are identified. IMPRESSION: 1. No acute intracranial abnormalities. 2. 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 Electronically Signed   By: Vinnie Langton M.D.   On: 01/07/2016 12:52   US Carotid Bilateral  01/08/2016  CLINICAL DATA:  45 year old male with a history of cerebral vascular accident. Cardiovascular risk factors include hypertension, hyperlipidemia, tobacco use, known coronary artery disease EXAM: BILATERAL CAROTID DUPLEX ULTRASOUND TECHNIQUE: Pearline Cables scale imaging, color Doppler and duplex ultrasound were performed of bilateral carotid and vertebral arteries in the neck. COMPARISON:  03/07/2014 FINDINGS: Criteria: Quantification of carotid stenosis is based on velocity parameters that correlate the residual internal carotid diameter with NASCET-based stenosis levels, using the diameter of the distal internal carotid lumen as the denominator for stenosis measurement. The following velocity measurements were obtained: RIGHT ICA:  Systolic 56 cm/sec, Diastolic  cm/sec CCA:  82 cm/sec SYSTOLIC ICA/CCA RATIO:  0.7 ECA:  101 cm/sec LEFT ICA:  Systolic 50 cm/sec, Diastolic  cm/sec CCA:  94 cm/sec SYSTOLIC ICA/CCA RATIO:  0.5 ECA:  75 cm/sec Right Brachial SBP: Not acquired Left Brachial SBP: Not acquired RIGHT CAROTID ARTERY: No significant calcified disease of the right common carotid artery. Intermediate waveform maintained. Heterogeneous plaque without significant calcifications at the right carotid bifurcation. Low resistance waveform of the right ICA. Tortuosity. RIGHT VERTEBRAL ARTERY: Antegrade flow with low resistance waveform. LEFT CAROTID ARTERY: No significant calcified disease of the left common carotid artery. Intermediate waveform maintained. Heterogeneous plaque at the left carotid bifurcation without significant calcifications.  Low resistance waveform of the left ICA. Tortuosity of the left system. LEFT VERTEBRAL ARTERY:  Antegrade flow with low resistance waveform. IMPRESSION: Color duplex indicates minimal heterogeneous plaque, with no hemodynamically significant stenosis by duplex criteria in the extracranial cerebrovascular circulation. Signed, Dulcy Fanny. Earleen Newport, DO [facility] Radiology  Electronically Signed   By: Corrie Mckusick D.O.   On: 01/08/2016 08:16   Dg Chest Port 1 View  01/16/2016  CLINICAL DATA:  Renal failure. EXAM: PORTABLE CHEST 1 VIEW COMPARISON:  01/15/2016. FINDINGS: Endotracheal tube, NG tube, right IJ line stable position. Cardiomegaly with bilateral pulmonary interstitial prominence and spot small pleural effusions consistent congestive heart failure. No pneumothorax . IMPRESSION: 1. Lines and tubes in stable position. 2. Findings consistent with congestive heart failure with bilateral pulmonary interstitial edema and bilateral small pleural effusions. Electronically Signed   By: Marcello Moores  Register   On: 01/16/2016 07:51   Dg Chest Port 1 View  01/15/2016  CLINICAL DATA:  Acute Respiratory Failure. Follow up vent. EXAM: PORTABLE CHEST 1 VIEW COMPARISON:  01/14/2016 FINDINGS: Endotracheal tube, NG tube, and RIGHT central venous line are unchanged. Stable cardiac silhouette. No effusion, infiltrate or pneumothorax. IMPRESSION: 1. Stable support apparatus.  No interval change. Electronically Signed   By: Suzy Bouchard M.D.   On: 01/15/2016 10:08   Dg Chest Port 1 View  01/14/2016  CLINICAL DATA:  Hypoxia EXAM: PORTABLE CHEST 1 VIEW COMPARISON:  Chest radiograph from earlier today. FINDINGS: Right internal jugular central venous catheter terminates at the cavoatrial junction. Endotracheal tube tip is 3.9 cm above the carina. Enteric tube enters stomach with the tip not seen on this image. Stable cardiomediastinal silhouette with normal heart size. No pneumothorax.  No pleural effusion. Slightly low lung volumes. No pulmonary edema. Mild hazy opacity at the right lung base appears increased. IMPRESSION: 1. Well-positioned support structures.  No pneumothorax . 2. Slightly low lung volumes. Increased mild hazy right basilar lung opacity, favor atelectasis, cannot exclude aspiration or developing pneumonia. Electronically Signed   By: Ilona Sorrel M.D.   On: 01/14/2016 17:12   Dg Chest Port 1 View  01/14/2016  CLINICAL DATA:  Acute respiratory failure.  History of hypertension. EXAM: PORTABLE CHEST 1 VIEW COMPARISON:  01/13/2016 FINDINGS: Lungs remain clear.  Cardiac silhouette is normal in size. No convincing pleural effusion.  No pneumothorax. Endotracheal tube, right internal jugular central venous line and nasal/ orogastric tube are stable in well positioned. IMPRESSION: 1. No acute findings in the lungs. 2. Support apparatus is well positioned. 3. No change from prior study. Electronically Signed   By: Lajean Manes M.D.   On: 01/14/2016 07:47   Dg Chest Port 1 View  01/13/2016  CLINICAL DATA:  Respiratory failure. EXAM: PORTABLE CHEST 1 VIEW COMPARISON:  01/12/2016. FINDINGS: Endotracheal tube terminates approximately 4.2 cm above the carina. Nasogastric tube terminates in the stomach. Right IJ central line tip projects at the SVC RA junction. Heart size is accentuated by AP technique and low lung volumes. Minimal streaky atelectasis in the left lower lobe. Lungs are otherwise clear. No pleural fluid. IMPRESSION: No acute findings. Electronically Signed   By: Lorin Picket M.D.   On: 01/13/2016 08:35   Dg Chest Port 1 View  01/12/2016  CLINICAL DATA:  Endotracheal tube placement EXAM: PORTABLE CHEST 1 VIEW COMPARISON:  08/02/2014 FINDINGS: Interval placement endotracheal tube with tip measuring 3.3 cm above the carina. Shallow inspiration. Heart size and pulmonary vascularity are normal. Lungs appear clear and expanded. No focal airspace disease or consolidation.  No blunting of costophrenic angles. No pneumothorax. IMPRESSION: Endotracheal tube tip measures 3.3 cm above the carina. Electronically Signed   By: Lucienne Capers M.D.   On: 01/12/2016 02:22    Assessment:   CADIN LUKA Sr. is a 45 y.o. male  with hx poorly controlled HTN, CAD, substance use (THC  And benzo + tox screen but prior mention of cocaine use in past) admitted with unresponsiveness after a fall. Several days prior had admission with sxs of tingling on L side of body and markedly elevated BP with DBP 130. He was dx with HTN emergency and ruled out for CVA at that time. Dced but unable to afford meds.  He had no fever initially but then developed high fever 105 several days into admission. Hx is concerned since he had contact with his pet dog which died about 2 weeks ago unexpectedly. His wife is not certain of rabies vaccine status of dog but does not think has had a shot in 3 years.  He is HIV neg, his wbc is elevated now and his alt is markedly elevated. He has been on dilantin since admisison for possible seizures. Remains comatose. Unable to do LP now due to TCP.  Diff dx includes PRESS as cause of AMS and possible anticonvulsant hypersensitivity reaction to dilatin (fever, elevated LFTS), viral encephalitis including arboviruses and rabies, vector borne bacterieal infection (RMSF, leptospirosis), or bacterial meningits (less likely). He does have thick sputum and trach cx with MSSA  Recommendations Droplet and contact precautions Spoke with neuro - stop dilantin. Given increased lfts I have stopped statin as well.  Continue vanco  Change zosyn to meropenem for better CNS penetration  - add doxy If can have one would rec an LP - can send for arboviral testing and rabies testing as well as routine studies and HSV. If no other dx based on above would need saliva studies nuchal skin bxp and CSF sent to [facility] for testing for rabies.  Thank you very much for allowing me to participate in  the care of this patient. Please call with questions.   Cheral Marker. Ola Spurr, MD

## 2016-01-16 NOTE — Care Management (Signed)
Admitted 4/15 with stroke like symptoms. CT negative. He was discharge on HTN meds and never got them filled.  Readmitted 4/19 following a fall with positive UDS. Now unresponsive and intubated. 4/20 CT positive for right thalamic lunar infarct.  4/24 remains unresponsive, intubated and sedated. Neurology and ID following.  Lives at home with spouse prior to admission, uninsured. Currently receiving aggressive treatment.

## 2016-01-16 NOTE — Progress Notes (Signed)
ANTIBIOTIC CONSULT NOTE - INITIAL  Pharmacy Consult for Meropenem Indication: meningitis  No Known Allergies  Patient Measurements: Height: 6' 1"  (185.4 cm) Weight: 281 lb 12 oz (127.8 kg) IBW/kg (Calculated) : 79.9 Adjusted Body Weight:   Vital Signs: Temp: 97 F (36.1 C) (04/24 1800) Temp Source: Core (Comment) (04/24 1800) BP: 145/106 mmHg (04/24 1900) Pulse Rate: 70 (04/24 1900) Intake/Output from previous day: 04/23 0701 - 04/24 0700 In: 4090 [I.V.:2171; NG/GT:1705; IV Piggyback:214] Out: 1750 [Urine:1750] Intake/Output from this shift:    Labs:  Recent Labs  01/14/16 0455 01/14/16 0610 01/15/16 0559 01/16/16 0237 01/16/16 0514 01/16/16 1212  WBC 11.9*  --   --   --  18.1*  --   HGB 13.2  --   --   --  11.6*  --   PLT 75*  --   --   --  52*  --   LABCREA  --   --   --   --   --  86  CREATININE  --  4.01* 5.17* 4.33*  --   --    Estimated Creatinine Clearance: 30.5 mL/min (by C-G formula based on Cr of 4.33).  Recent Labs  01/14/16 1643 01/15/16 0559 01/16/16 0514  VANCOTROUGH 66*  --   --   Jake Michaelis  --  30 12     Microbiology: Recent Results (from the past 720 hour(s))  MRSA PCR Screening     Status: Abnormal   Collection Time: 01/12/16  6:01 AM  Result Value Ref Range Status   MRSA by PCR POSITIVE (A) NEGATIVE Final    Comment:        The GeneXpert MRSA Assay (FDA approved for NASAL specimens only), is one component of a comprehensive MRSA colonization surveillance program. It is not intended to diagnose MRSA infection nor to guide or monitor treatment for MRSA infections. CRITICAL RESULT CALLED TO, READ BACK BY AND VERIFIED WITH: PAM MYERS 01/12/16 0751 SGD   Culture, blood (Routine X 2) w Reflex to ID Panel     Status: None (Preliminary result)   Collection Time: 01/12/16  4:54 PM  Result Value Ref Range Status   Specimen Description BLOOD LEFT HAND  Final   Special Requests BOTTLES DRAWN AEROBIC AND ANAEROBIC [place]   Final   Culture NO GROWTH 3 DAYS  Final   Report Status PENDING  Incomplete  Culture, blood (Routine X 2) w Reflex to ID Panel     Status: None (Preliminary result)   Collection Time: 01/12/16  5:04 PM  Result Value Ref Range Status   Specimen Description BLOOD LEFT ARM  Final   Special Requests   Final    BOTTLES DRAWN AEROBIC AND ANAEROBIC  AERO 10CC ANA 5CC   Culture NO GROWTH 3 DAYS  Final   Report Status PENDING  Incomplete  Culture, expectorated sputum-assessment     Status: None   Collection Time: 01/14/16  4:26 AM  Result Value Ref Range Status   Specimen Description TRACHEAL ASPIRATE  Final   Special Requests NONE  Final   Sputum evaluation THIS SPECIMEN IS ACCEPTABLE FOR SPUTUM CULTURE  Final   Report Status 01/14/2016 FINAL  Final  Culture, respiratory (NON-Expectorated)     Status: None   Collection Time: 01/14/16  4:26 AM  Result Value Ref Range Status   Specimen Description TRACHEAL ASPIRATE  Final   Special Requests NONE Reflexed from [id number]  Final   Gram Stain FEW WBC SEEN FEW GRAM POSITIVE COCCI  Final   Culture MODERATE GROWTH STAPHYLOCOCCUS AUREUS  Final   Report Status 01/16/2016 FINAL  Final   Organism ID, Bacteria STAPHYLOCOCCUS AUREUS  Final      Susceptibility   Staphylococcus aureus - MIC*    CIPROFLOXACIN <=0.5 SENSITIVE Sensitive     ERYTHROMYCIN 2 RESISTANT Resistant     GENTAMICIN <=0.5 SENSITIVE Sensitive     OXACILLIN 0.5 SENSITIVE Sensitive     TETRACYCLINE <=1 SENSITIVE Sensitive     VANCOMYCIN 1 SENSITIVE Sensitive     TRIMETH/SULFA <=10 SENSITIVE Sensitive     CLINDAMYCIN <=0.25 RESISTANT Resistant     RIFAMPIN <=0.5 SENSITIVE Sensitive     Inducible Clindamycin Value in next row Resistant      POSITIVEINDUCIBLE CLINDAMYCIN RESISTANCE - A positive ICR test is indicative of inducible resistance to macrolides, lincosamides, and type B streptogramin.  This isolate is presumed to be resistant to Clindamycin, however, Clindamycin may still be  effective in some patients.     * MODERATE GROWTH STAPHYLOCOCCUS AUREUS  Culture, blood (Routine X 2) w Reflex to ID Panel     Status: None (Preliminary result)   Collection Time: 01/14/16  4:46 AM  Result Value Ref Range Status   Specimen Description BLOOD LEFT ASSIST CONTROL  Final   Special Requests   Final    BOTTLES DRAWN AEROBIC AND ANAEROBIC 15CCAERO,12CCANA   Culture NO GROWTH 2 DAYS  Final   Report Status PENDING  Incomplete  Culture, blood (Routine X 2) w Reflex to ID Panel     Status: None (Preliminary result)   Collection Time: 01/14/16  5:06 AM  Result Value Ref Range Status   Specimen Description BLOOD RIGHT ASSIST CONTROL  Final   Special Requests   Final    BOTTLES DRAWN AEROBIC AND ANAEROBIC 12CCAERO,12CCANA   Culture NO GROWTH 1 DAY  Final   Report Status PENDING  Incomplete  Urine culture     Status: None   Collection Time: 01/14/16  6:28 AM  Result Value Ref Range Status   Specimen Description URINE, RANDOM  Final   Special Requests NONE  Final   Culture NO GROWTH 1 DAY  Final   Report Status 01/15/2016 FINAL  Final  Culture, expectorated sputum-assessment     Status: None   Collection Time: 01/16/16 12:10 PM  Result Value Ref Range Status   Specimen Description TRACHEAL ASPIRATE  Final   Special Requests NONE  Final   Sputum evaluation THIS SPECIMEN IS ACCEPTABLE FOR SPUTUM CULTURE  Final   Report Status 01/16/2016 FINAL  Final  Culture, respiratory (NON-Expectorated)     Status: None (Preliminary result)   Collection Time: 01/16/16 12:10 PM  Result Value Ref Range Status   Specimen Description TRACHEAL ASPIRATE  Final   Special Requests NONE Reflexed from [id number]  Final   Gram Stain RARE WBC SEEN NO ORGANISMS SEEN   Final   Culture PENDING  Incomplete   Report Status PENDING  Incomplete    Medical History: Past Medical History  Diagnosis Date  . MI (myocardial infarction) ([place])   . Hypertension     Medications:  Scheduled:  . antiseptic oral  rinse  7 mL Mouth Rinse QID  . aspirin EC  81 mg Oral Daily  . chlorhexidine gluconate (SAGE KIT)  15 mL Mouth Rinse BID  . Chlorhexidine Gluconate Cloth  6 each Topical Q0600  . cloNIDine  0.1 mg Transdermal Weekly  . diazepam  5 mg Per NG tube Q6H  . docusate  100 mg Per Tube BID  . doxycycline (VIBRAMYCIN) IV  100 mg Intravenous Q12H  . feeding supplement (PRO-STAT SUGAR FREE 64)  30 mL Per Tube BID  . free water  100 mL Per Tube Q8H  . meropenem (MERREM) IV  2 g Intravenous Q12H  . mupirocin ointment  1 application Nasal BID  . pantoprazole (PROTONIX) IV  40 mg Intravenous Q24H  . phenytoin (DILANTIN) IV  200 mg Intravenous Q8H  . polyethylene glycol  17 g Per Tube Daily  . sennosides  5 mL Oral BID   Assessment: CrCl = 30.5 ml/min   Goal of Therapy:  resolution of infection   Plan:  Expected duration 7 days with resolution of temperature and/or normalization of WBC   Will start meropenem 2 gm IV Q12H on 4/24 @ 21:30.   Maddock Finigan D 01/16/2016,9:09 PM

## 2016-01-16 NOTE — Progress Notes (Signed)
[facility] Critical Care Medicine Progess Note    ASSESSMENT/PLAN   45 year old male past medical history of hypertension, coronary artery disease, admitted for unresponsiveness and respiratory failure Found to have acute RT sided CVA-thalamic stroke requiring full vent support. Course, complicated by persistent fevers, source uncertain.  PULMONARY Respiratory failure-unresponsive VDRF Need for MV -Seizure Lactic acidosis R Thalamic Lacunar infarct Chest x-ray 4/24 review, increasing right atelectasis versus infiltrate noted.  P:  - cont with MV, wean as tolerated - maintain O2 sats >88% - SBT/SAT as tolerated - prn ABG - prn Bronchodilators - fentanyl/versed  - PCT>10.73 -Repeat cultures pending  CARDIOVASCULAR CVL RIJ A:  CAD HTN Hx of MI Hx of Coronary Angioplasty with stent placement-hemodynamically stable Possible NSTEMI Now with elevated blood pressure P:  -monitor BP - RIJ with guide wire in place - s\p guide wire removal by vascular surgery, and placement of new RIJ CVL - maintain SBP<140 - PRN labetalol/hydralazine, may need to start on the nicardipine infusion. If blood pressures remain elevated. - cardiology following, mild troponin elevation, heparin gtt stopped  RENAL Elevated Creatinine P:  - BMP as needed - avoid nephrotoxic drugs - possibly due to chronic HTN   GASTROINTESTINAL Etoh Use Marijuana Use - PPI - tube feeds.  HEMATOLOGIC -CBC  INFECTIOUS A: high fevers P:  - f\u cultures - EMPIRIC ABX -F/U PCT -check lipase  Micro/culture results: BC  4/20-22-24>> negative UC 4/22-24>> negative thus far Sputum 4/22>> gram-positive cocci, further results pending. MRSA POSITIVE  Antibiotics: Antimicrobials this admission: Vancomycin 4/21 >>  Zosyn 4/21 >>    ENDOCRINE -ICU Hypo/hyperglycemia protocol -Hypoglycemia overnight, the patient is currently on D5 normal saline at 125 mL an hour plus D10 infusion plus  vital high protein.  NEUROLOGIC A:  Unresponsiveness Possible Seizure episode.  R Thalamic Lacunar Infarct P:  RASS goal: -1,0 - neuro following - seizure precautions.  - AEDs (Dilantin) - BP control  -Neurologist will plan for Lumbar Puncture   Best Practice: Code Status: Full. Diet: NPO GI prophylaxis: PPI. VTE prophylaxis: SCD's / F/U platelets, if normal Schedule SQ heaprin   MAJOR EVENTS/TEST RESULTS: EEG 01/13/16 IMPRESSION: This is an abnormal electroencephalogram due to general background slowing. Findings are consistent with current medications usage. No epileptiform activity is noted.   Echocardiogram 01/08/16 EF 55%, no other significant abnormalities noted.  Best Practices     ---------------------------------------   ----------------------------------------   Name: Duane Ada Sr. MRN: [id number] DOB: Dec 29, 1970    ADMISSION DATE:  01/12/2016     Pt currently on the ventilator, can not provide history or review of systems.     VITAL SIGNS: Temp:  [98.6 F (37 C)-102.8 F (39.3 C)] 102.7 F (39.3 C) (04/24 0800) Pulse Rate:  [65-132] 100 (04/24 0900) Resp:  [21-35] 26 (04/24 0900) BP: (133-254)/(78-154) 235/120 mmHg (04/24 0905) SpO2:  [87 %-100 %] 99 % (04/24 0900) FiO2 (%):  [30 %-40 %] 40 % (04/24 0900) Weight:  [281 lb 12 oz (127.8 kg)] 281 lb 12 oz (127.8 kg) (04/24 0600) HEMODYNAMICS:   VENTILATOR SETTINGS: Vent Mode:  [-] PRVC FiO2 (%):  [30 %-40 %] 40 % Set Rate:  [26 bmp] 26 bmp Vt Set:  [550 mL] 550 mL PEEP:  [5 cmH20-8 cmH20] 5 cmH20 Plateau Pressure:  [20 cmH20] 20 cmH20 INTAKE / OUTPUT:  Intake/Output Summary (Last 24 hours) at 01/16/16 0932 Last data filed at 01/16/16 0900  Gross per 24 hour  Intake   4060 ml  Output  1625 ml  Net   2435 ml    PHYSICAL EXAMINATION: Physical Examination:   VS: BP 235/120 mmHg  Pulse 100  Temp(Src) 102.7 F (39.3 C) (Rectal)  Resp 26  Ht  (1.854 m)   Wt 281 lb 12 oz (127.8 kg)  BMI 37.18 kg/m2  SpO2 99%  General Appearance: No distress  Neuro:without focal findings, mental status reduced.  HEENT: PERRLA, EOM intact. Pulmonary: normal breath sounds   CardiovascularNormal S1,S2.  No m/r/g.   Abdomen: Benign, Soft, non-tender. Renal:  No costovertebral tenderness  GU:  Not performed at this time. Endocrine: No evident thyromegaly. Skin:   warm, no rashes, no ecchymosis  Extremities: normal, no cyanosis, clubbing.   LABS:   LABORATORY PANEL:   CBC  Recent Labs Lab 01/16/16 0514  WBC 18.1*  HGB 11.6*  HCT 35.2*  PLT 52*    Chemistries   Recent Labs Lab 01/13/16 0601  01/16/16 0237 01/16/16 0514  NA 143  < > 142  --   K 3.8  < > 4.2  --   CL 114*  < > 115*  --   CO2 24  < > 19*  --   GLUCOSE 162*  < > 277*  --   BUN 17  < > 70*  --   CREATININE 1.14  < > 4.33*  --   CALCIUM 8.8*  < > 7.8*  --   MG  --   --   --  2.2  PHOS  --   --   --  2.3*  AST 25  --   --   --   ALT 20  --   --   --   ALKPHOS 61  --   --   --   BILITOT 0.7  --   --   --   < > = values in this interval not displayed.   Recent Labs Lab 01/15/16 1957 01/15/16 2059 01/15/16 2323 01/16/16 0126 01/16/16 0131 01/16/16 0729  GLUCAP 41* 147* 66 47* 284* 207*    Recent Labs Lab 01/12/16 0236 01/14/16 1620 01/14/16 2114  PHART 7.37 7.15* 7.29*  PCO2ART 33 59* 44  PO2ART 153* 191* 402*    Recent Labs Lab 01/12/16 0134 01/13/16 0601  AST 28 25  ALT 27 20  ALKPHOS 95 61  BILITOT 0.6 0.7  ALBUMIN 4.5 3.4*    Cardiac Enzymes  Recent Labs Lab 01/12/16 1814  TROPONINI 0.10*    RADIOLOGY:  Ct Head Wo Contrast  01/14/2016  CLINICAL DATA:  EMS was called late last night after patient awoke from sleep, walked a few steps then fell over became unresponsive and hit his head, upon arrival EMS did not have to perform any CPR. In the emergency department the patient appeared to have a posturing event of his extremities and  loss of urine, thought to be seizure episode, given that he was posturing for less than a minute or so, he was intubated for airway protection. Head CT was negative for stroke or bleed. EXAM: CT HEAD WITHOUT CONTRAST TECHNIQUE: Contiguous axial images were obtained from the base of the skull through the vertex without intravenous contrast. COMPARISON:  01/12/2016 FINDINGS: The ventricles are normal in size and configuration. Small area of hypoattenuation along the superior right thalamus is noted consistent with a lacune infarct of unclear chronicity. This measures 6 mm in size, which is larger than on the prior CT. This may be subacute. No other evidence of an  infarct. There are no parenchymal masses or mass effect, no extra-axial masses or abnormal fluid collections and no intracranial hemorrhage. Opacification of the visualize left maxillary sinus. Moderate mucosal thickening with dependent fluid in several of the left ethmoid air cells. Dependent fluid mild mucosal thickening in sphenoid sinuses. Clear mastoid air cells. IMPRESSION: 1. Possible subacute versus chronic lacune infarct in the right thalamus, which appears larger than on the most recent prior CT. Followup brain MRI without contrast is recommended once this patient can tolerate the procedure. 2. No other intracranial abnormality. 3. Sinus disease as described. Fluid levels in the left ethmoid and bilateral sphenoid sinuses are new prior CT. Electronically Signed   By: Amie Portland M.D.   On: 01/14/2016 11:01   Dg Chest Port 1 View  01/16/2016  CLINICAL DATA:  Renal failure. EXAM: PORTABLE CHEST 1 VIEW COMPARISON:  01/15/2016. FINDINGS: Endotracheal tube, NG tube, right IJ line stable position. Cardiomegaly with bilateral pulmonary interstitial prominence and spot small pleural effusions consistent congestive heart failure. No pneumothorax . IMPRESSION: 1. Lines and tubes in stable position. 2. Findings consistent with congestive heart failure  with bilateral pulmonary interstitial edema and bilateral small pleural effusions. Electronically Signed   By: Maisie Fus  Register   On: 01/16/2016 07:51   Dg Chest Port 1 View  01/15/2016  CLINICAL DATA:  Acute Respiratory Failure. Follow up vent. EXAM: PORTABLE CHEST 1 VIEW COMPARISON:  01/14/2016 FINDINGS: Endotracheal tube, NG tube, and RIGHT central venous line are unchanged. Stable cardiac silhouette. No effusion, infiltrate or pneumothorax. IMPRESSION: 1. Stable support apparatus.  No interval change. Electronically Signed   By: Genevive Bi M.D.   On: 01/15/2016 10:08   Dg Chest Port 1 View  01/14/2016  CLINICAL DATA:  Hypoxia EXAM: PORTABLE CHEST 1 VIEW COMPARISON:  Chest radiograph from earlier today. FINDINGS: Right internal jugular central venous catheter terminates at the cavoatrial junction. Endotracheal tube tip is 3.9 cm above the carina. Enteric tube enters stomach with the tip not seen on this image. Stable cardiomediastinal silhouette with normal heart size. No pneumothorax. No pleural effusion. Slightly low lung volumes. No pulmonary edema. Mild hazy opacity at the right lung base appears increased. IMPRESSION: 1. Well-positioned support structures.  No pneumothorax . 2. Slightly low lung volumes. Increased mild hazy right basilar lung opacity, favor atelectasis, cannot exclude aspiration or developing pneumonia. Electronically Signed   By: Delbert Phenix M.D.   On: 01/14/2016 17:12       --Wells Guiles, MD.  ICU Pager: [phone] [facility] Office Number: [phone]  Santiago Glad, M.D.  Stephanie Acre, M.D.  Billy Fischer, M.D  01/16/2016  Critical Care Attestation.  I have personally obtained a history, examined the patient, evaluated laboratory and imaging results, formulated the assessment and plan and placed orders. The Patient requires high complexity decision making for assessment and support, frequent evaluation and titration of  therapies, application of advanced monitoring technologies and extensive interpretation of multiple databases. The patient has critical illness that could lead imminently to failure of 1 or more organ systems and requires the highest level of physician preparedness to intervene.  Critical Care Time devoted to patient care services described in this note is 45 minutes and is exclusive of time spent in procedures.

## 2016-01-16 NOTE — Consult Note (Signed)
Date: 01/16/2016                  Patient Name:  Duane GLYMPH Sr.  MRN: [id number]  DOB: 04-Sep-1971  Age / Sex: 45 y.o., male         PCP: No PCP Per Patient                 Service Requesting Consult: Internal medicine                 Reason for Consult: ARF            History of Present Illness: Patient is a 45 y.o. male with medical problems of  severe uncontrolled HTN, NSTEMI (2014) with occluded RCA and LCx, no PCI, previous h/o cocaine abuse, who was admitted to [facility] on 01/12/2016 for evaluation of left sided weakness on 4/15 and then on 4/20 when he was found unresponsive at home.   Patient previously reported not taking his BP meds at home. He hit his head after a fall at home Intubated for airway protection in the ER + Alcohol and Marijuana prior to this episode  At present he is intubated sedated. BP is severely high Ventilator assisted Renal consult for ARF Results for DAVINCI, GLOTFELTY SR. (MRN [id number]) as of 01/16/2016 10:05  Ref. Range 01/12/2016 18:14 01/13/2016 06:01 01/13/2016 18:09 01/14/2016 04:55 01/14/2016 06:10 01/15/2016 05:59 01/16/2016 02:37 01/16/2016 05:14  Creatinine Latest Ref Range: 0.61-1.24 mg/dL  1.14   4.01 (H) 5.17 (H) 4.33 (H)    creatinine trend as above  UOP is good   Medications: Outpatient medications: Prescriptions prior to admission  Medication Sig Dispense Refill Last Dose  . aspirin EC 81 MG tablet Take 81 mg by mouth daily.   01/11/2016 at Unknown time  . atorvastatin (LIPITOR) 40 MG tablet Take 1 tablet (40 mg total) by mouth daily at 6 PM. 30 tablet 0 01/11/2016 at Unknown time  . hydrochlorothiazide (HYDRODIURIL) 25 MG tablet Take 1 tablet (25 mg total) by mouth daily. 30 tablet 0 01/11/2016 at Unknown time  . metoprolol tartrate (LOPRESSOR) 25 MG tablet Take 1 tablet (25 mg total) by mouth 2 (two) times daily. 60 tablet 0 01/11/2016 at Unknown time    Current medications: Current Facility-Administered Medications  Medication Dose  Route Frequency Provider Last Rate Last Dose  . acetaminophen (TYLENOL) tablet 650 mg  650 mg Oral Q6H PRN Harrie Foreman, MD   650 mg at 01/16/16 7972   Or  . acetaminophen (TYLENOL) suppository 650 mg  650 mg Rectal Q6H PRN Harrie Foreman, MD      . antiseptic oral rinse solution (CORINZ)  7 mL Mouth Rinse QID Harrie Foreman, MD   7 mL at 01/16/16 0327  . aspirin EC tablet 81 mg  81 mg Oral Daily Harrie Foreman, MD   81 mg at 01/15/16 1035  . atorvastatin (LIPITOR) tablet 40 mg  40 mg Per Tube q1800 Harrie Foreman, MD   40 mg at 01/15/16 1736  . chlorhexidine gluconate (SAGE KIT) (PERIDEX) 0.12 % solution 15 mL  15 mL Mouth Rinse BID Harrie Foreman, MD   15 mL at 01/16/16 0800  . Chlorhexidine Gluconate Cloth 2 % PADS 6 each  6 each Topical Q0600 Vishal Mungal, MD   6 each at 01/15/16 0600  . cloNIDine (CATAPRES - Dosed in mg/24 hr) patch 0.1 mg  0.1 mg Transdermal Weekly Mikael Spray, NP  0.1 mg at 01/16/16 0326  . dextrose 10 % infusion   Intravenous Continuous Mikael Spray, NP   Stopped at 01/16/16 0335  . dextrose 5 %-0.9 % sodium chloride infusion   Intravenous Continuous Mikael Spray, NP 125 mL/hr at 01/16/16 0900    . diazepam (VALIUM) tablet 5 mg  5 mg Per NG tube Q6H Mikael Spray, NP   5 mg at 01/16/16 0525  . docusate (COLACE) 50 MG/5ML liquid 100 mg  100 mg Per Tube BID Vilinda Boehringer, MD   100 mg at 01/15/16 2141  . feeding supplement (PRO-STAT SUGAR FREE 64) liquid 30 mL  30 mL Per Tube BID Vishal Mungal, MD   30 mL at 01/15/16 2141  . feeding supplement (VITAL HIGH PROTEIN) liquid 1,000 mL  1,000 mL Per Tube Continuous Vilinda Boehringer, MD   Stopped at 01/16/16 0700  . fentaNYL (SUBLIMAZE) bolus via infusion 50 mcg  50 mcg Intravenous Q1H PRN Vishal Mungal, MD   50 mcg at 01/15/16 1053  . fentaNYL 2569mg in NS 2549m(1054mml) infusion-PREMIX  25-400 mcg/hr Intravenous Continuous Vishal Mungal, MD 12.5 mL/hr at 01/16/16 0900 125 mcg/hr at  01/16/16 0900  . free water 100 mL  100 mL Per Tube Q8H Vishal Mungal, MD   100 mL at 01/16/16 0600  . hydrALAZINE (APRESOLINE) injection 10-20 mg  10-20 mg Intravenous Q4H PRN MagMikael SprayP   20 mg at 01/16/16 0905  . ibuprofen (ADVIL,MOTRIN) 100 MG/5ML suspension 400 mg  400 mg Per Tube Q6H PRN MagMikael SprayP   400 mg at 01/16/16 0752  . labetalol (NORMODYNE,TRANDATE) injection 20 mg  20 mg Intravenous Q1H PRN KurFlora LippsD   20 mg at 01/16/16 0713614 midazolam (VERSED) 50 mg in sodium chloride 0.9 % 50 mL (1 mg/mL) infusion  2 mg/hr Intravenous Continuous KurFlora LippsD 2 mL/hr at 01/16/16 0900 2 mg/hr at 01/16/16 0900  . midazolam (VERSED) injection 2 mg  2 mg Intravenous Q15 min PRN Vishal Mungal, MD   2 mg at 01/12/16 1500  . midazolam (VERSED) injection 2 mg  2 mg Intravenous Q2H PRN VisVilinda BoehringerD   2 mg at 01/16/16 0536  . mupirocin ointment (BACTROBAN) 2 % 1 application  1 application Nasal BID VisVilinda BoehringerD   1 application at 04/43/15/4042  . ondansetron (ZOFRAN) tablet 4 mg  4 mg Oral Q6H PRN MicHarrie ForemanD       Or  . ondansetron ([facility] Hospitalnjection 4 mg  4 mg Intravenous Q6H PRN MicHarrie ForemanD      . pantoprazole (PROTONIX) injection 40 mg  40 mg Intravenous Q24H KurFlora LippsD   40 mg at 01/16/16 0707  . phenytoin (DILANTIN) 200 mg in sodium chloride 0.9 % 100 mL IVPB  200 mg Intravenous Q8H LesAlexis GoodellD   200 mg at 01/16/16 0518  . piperacillin-tazobactam (ZOSYN) IVPB 3.375 g  3.375 g Intravenous Q12H Vishal Mungal, MD   3.375 g at 01/15/16 2205  . sennosides (SENOKOT) 8.8 MG/5ML syrup 5 mL  5 mL Oral BID Vishal Mungal, MD   5 mL at 01/15/16 2142  . vecuronium (NORCURON) injection 10 mg  10 mg Intravenous Q1H PRN KurFlora LippsD   10 mg at 01/16/16 [id number]   Allergies: No Known Allergies    Past Medical History: Past Medical History  Diagnosis Date  . MI (myocardial infarction) ([facility] .  Hypertension      Past Surgical  History: Past Surgical History  Procedure Laterality Date  . Coronary angioplasty with stent placement    . Peripheral vascular catheterization N/A 01/12/2016    Procedure: Foreign Body Retrieval, intravascular;  Surgeon: Algernon Huxley, MD;  Location: [facility];  Service: Cardiovascular;  Laterality: N/A;     Family History: Family History  Problem Relation Age of Onset  . Seizures Sister   . Diabetes Mellitus II Mother   . Heart disease Father      Social History: Social History   Social History  . Marital Status: Married    Spouse Name: N/A  . Number of Children: N/A  . Years of Education: N/A   Occupational History  . Not on file.   Social History Main Topics  . Smoking status: Current Every Day Smoker -- 0.50 packs/day    Types: Cigarettes  . Smokeless tobacco: Never Used  . Alcohol Use: Yes     Comment: 6pk/week  . Drug Use: 7.00 per week    Special: Marijuana  . Sexual Activity: Yes   Other Topics Concern  . Not on file   Social History Narrative     Review of Systems: not available Gen:  HEENT:  CV:  Resp:  GI: GU :  MS:  Derm:   Psych: Heme:  Neuro:  Endocrine  Vital Signs: Blood pressure 235/120, pulse 100, temperature 102.7 F (39.3 C), temperature source Rectal, resp. rate 26, height _0  (1.854 m), weight 127.8 kg (281 lb 12 oz), SpO2 99 %.   Intake/Output Summary (Last 24 hours) at 01/16/16 0929 Last data filed at 01/16/16 0900  Gross per 24 hour  Intake   4060 ml  Output   1625 ml  Net   2435 ml    Weight trends: Filed Weights   01/15/16 0426 01/15/16 0440 01/16/16 0600  Weight: 120.2 kg (264 lb 15.9 oz) 129.8 kg (286 lb 2.5 oz) 127.8 kg (281 lb 12 oz)    Physical Exam: General:  critically ill appearing  HEENT Pin point pupils  Neck:  no masses  Lungs: Vent assisted, tachypneic  Heart::  tachycardic  Abdomen: Somewhat firm, non distednded, sluggish BS  Extremities:  no [eripheral edema  Neurologic:  sedated  Skin: No acute rashes     Foley: present       Lab results: Basic Metabolic Panel:  Recent Labs Lab 01/13/16 0601 01/14/16 0610 01/15/16 0559 01/16/16 0237 01/16/16 0514  NA 143 142  --  142  --   K 3.8 4.5  --  4.2  --   CL 114* 110  --  115*  --   CO2 24 23  --  19*  --   GLUCOSE 162* 170*  --  277*  --   BUN 17 39*  --  70*  --   CREATININE 1.14 4.01* 5.17* 4.33*  --   CALCIUM 8.8* 8.2*  --  7.8*  --   MG  --   --   --   --  2.2  PHOS  --   --   --   --  2.3*    Liver Function Tests:  Recent Labs Lab 01/13/16 0601  AST 25  ALT 20  ALKPHOS 61  BILITOT 0.7  PROT 6.4*  ALBUMIN 3.4*    Recent Labs Lab 01/15/16 0559  LIPASE 29   No results for input(s): AMMONIA in the last 168 hours.  CBC:  Recent Labs Lab 01/12/16 0134  01/14/16 0455 01/16/16 0514  WBC 12.4*  < > 11.9* 18.1*  NEUTROABS 5.1  --   --   --   HGB 15.2  < > 13.2 11.6*  HCT 45.3  < > 39.6* 35.2*  MCV 91.1  < > 92.6 92.3  PLT 317  < > 75* 52*  < > = values in this interval not displayed.  Cardiac Enzymes:  Recent Labs Lab 01/12/16 1814  TROPONINI 0.10*    BNP: Invalid input(s): POCBNP  CBG:  Recent Labs Lab 01/15/16 2059 01/15/16 2323 01/16/16 0126 01/16/16 0131 01/16/16 0729  GLUCAP 147* 66 58* 284* 207*    Microbiology: Recent Results (from the past 720 hour(s))  MRSA PCR Screening     Status: Abnormal   Collection Time: 01/12/16  6:01 AM  Result Value Ref Range Status   MRSA by PCR POSITIVE (A) NEGATIVE Final    Comment:        The GeneXpert MRSA Assay (FDA approved for NASAL specimens only), is one component of a comprehensive MRSA colonization surveillance program. It is not intended to diagnose MRSA infection nor to guide or monitor treatment for MRSA infections. CRITICAL RESULT CALLED TO, READ BACK BY AND VERIFIED WITH: PAM MYERS 01/12/16 0751 SGD   Culture, blood (Routine X 2) w Reflex to ID Panel     Status: None (Preliminary result)    Collection Time: 01/12/16  4:54 PM  Result Value Ref Range Status   Specimen Description BLOOD LEFT HAND  Final   Special Requests BOTTLES DRAWN AEROBIC AND ANAEROBIC [place]  Final   Culture NO GROWTH 3 DAYS  Final   Report Status PENDING  Incomplete  Culture, blood (Routine X 2) w Reflex to ID Panel     Status: None (Preliminary result)   Collection Time: 01/12/16  5:04 PM  Result Value Ref Range Status   Specimen Description BLOOD LEFT ARM  Final   Special Requests   Final    BOTTLES DRAWN AEROBIC AND ANAEROBIC  AERO 10CC ANA 5CC   Culture NO GROWTH 3 DAYS  Final   Report Status PENDING  Incomplete  Culture, expectorated sputum-assessment     Status: None   Collection Time: 01/14/16  4:26 AM  Result Value Ref Range Status   Specimen Description TRACHEAL ASPIRATE  Final   Special Requests NONE  Final   Sputum evaluation THIS SPECIMEN IS ACCEPTABLE FOR SPUTUM CULTURE  Final   Report Status 01/14/2016 FINAL  Final  Culture, respiratory (NON-Expectorated)     Status: None (Preliminary result)   Collection Time: 01/14/16  4:26 AM  Result Value Ref Range Status   Specimen Description TRACHEAL ASPIRATE  Final   Special Requests NONE Reflexed from [id number]  Final   Gram Stain FEW WBC SEEN FEW GRAM POSITIVE COCCI   Final   Culture HOLDING FOR POSSIBLE PATHOGEN  Final   Report Status PENDING  Incomplete  Culture, blood (Routine X 2) w Reflex to ID Panel     Status: None (Preliminary result)   Collection Time: 01/14/16  4:46 AM  Result Value Ref Range Status   Specimen Description BLOOD LEFT ASSIST CONTROL  Final   Special Requests   Final    BOTTLES DRAWN AEROBIC AND ANAEROBIC 15CCAERO,12CCANA   Culture NO GROWTH 2 DAYS  Final   Report Status PENDING  Incomplete  Culture, blood (Routine X 2) w Reflex to ID Panel     Status: None (Preliminary result)  Collection Time: 01/14/16  5:06 AM  Result Value Ref Range Status   Specimen Description BLOOD RIGHT ASSIST CONTROL  Final    Special Requests   Final    BOTTLES DRAWN AEROBIC AND ANAEROBIC 12CCAERO,12CCANA   Culture NO GROWTH 1 DAY  Final   Report Status PENDING  Incomplete  Urine culture     Status: None   Collection Time: 01/14/16  6:28 AM  Result Value Ref Range Status   Specimen Description URINE, RANDOM  Final   Special Requests NONE  Final   Culture NO GROWTH 1 DAY  Final   Report Status 01/15/2016 FINAL  Final     Coagulation Studies: No results for input(s): LABPROT, INR in the last 72 hours.  Urinalysis: No results for input(s): COLORURINE, LABSPEC, PHURINE, GLUCOSEU, HGBUR, BILIRUBINUR, KETONESUR, PROTEINUR, UROBILINOGEN, NITRITE, LEUKOCYTESUR in the last 72 hours.  Invalid input(s): APPERANCEUR      Imaging: Ct Head Wo Contrast  01/14/2016  CLINICAL DATA:  EMS was called late last night after patient awoke from sleep, walked a few steps then fell over became unresponsive and hit his head, upon arrival EMS did not have to perform any CPR. In the emergency department the patient appeared to have a posturing event of his extremities and loss of urine, thought to be seizure episode, given that he was posturing for less than a minute or so, he was intubated for airway protection. Head CT was negative for stroke or bleed. EXAM: CT HEAD WITHOUT CONTRAST TECHNIQUE: Contiguous axial images were obtained from the base of the skull through the vertex without intravenous contrast. COMPARISON:  01/12/2016 FINDINGS: The ventricles are normal in size and configuration. Small area of hypoattenuation along the superior right thalamus is noted consistent with a lacune infarct of unclear chronicity. This measures 6 mm in size, which is larger than on the prior CT. This may be subacute. No other evidence of an infarct. There are no parenchymal masses or mass effect, no extra-axial masses or abnormal fluid collections and no intracranial hemorrhage. Opacification of the visualize left maxillary sinus. Moderate mucosal  thickening with dependent fluid in several of the left ethmoid air cells. Dependent fluid mild mucosal thickening in sphenoid sinuses. Clear mastoid air cells. IMPRESSION: 1. Possible subacute versus chronic lacune infarct in the right thalamus, which appears larger than on the most recent prior CT. Followup brain MRI without contrast is recommended once this patient can tolerate the procedure. 2. No other intracranial abnormality. 3. Sinus disease as described. Fluid levels in the left ethmoid and bilateral sphenoid sinuses are new prior CT. Electronically Signed   By: Lajean Manes M.D.   On: 01/14/2016 11:01   Dg Chest Port 1 View  01/16/2016  CLINICAL DATA:  Renal failure. EXAM: PORTABLE CHEST 1 VIEW COMPARISON:  01/15/2016. FINDINGS: Endotracheal tube, NG tube, right IJ line stable position. Cardiomegaly with bilateral pulmonary interstitial prominence and spot small pleural effusions consistent congestive heart failure. No pneumothorax . IMPRESSION: 1. Lines and tubes in stable position. 2. Findings consistent with congestive heart failure with bilateral pulmonary interstitial edema and bilateral small pleural effusions. Electronically Signed   By: Marcello Moores  Register   On: 01/16/2016 07:51   Dg Chest Port 1 View  01/15/2016  CLINICAL DATA:  Acute Respiratory Failure. Follow up vent. EXAM: PORTABLE CHEST 1 VIEW COMPARISON:  01/14/2016 FINDINGS: Endotracheal tube, NG tube, and RIGHT central venous line are unchanged. Stable cardiac silhouette. No effusion, infiltrate or pneumothorax. IMPRESSION: 1. Stable support apparatus.  No interval change. Electronically Signed   By: Suzy Bouchard M.D.   On: 01/15/2016 10:08   Dg Chest Port 1 View  01/14/2016  CLINICAL DATA:  Hypoxia EXAM: PORTABLE CHEST 1 VIEW COMPARISON:  Chest radiograph from earlier today. FINDINGS: Right internal jugular central venous catheter terminates at the cavoatrial junction. Endotracheal tube tip is 3.9 cm above the carina. Enteric  tube enters stomach with the tip not seen on this image. Stable cardiomediastinal silhouette with normal heart size. No pneumothorax. No pleural effusion. Slightly low lung volumes. No pulmonary edema. Mild hazy opacity at the right lung base appears increased. IMPRESSION: 1. Well-positioned support structures.  No pneumothorax . 2. Slightly low lung volumes. Increased mild hazy right basilar lung opacity, favor atelectasis, cannot exclude aspiration or developing pneumonia. Electronically Signed   By: Ilona Sorrel M.D.   On: 01/14/2016 17:12      Assessment & Plan: Pt is a 45 y.o. yo male with a PMHX of severe uncontrolled HTN, NSTEMI (2014) with occluded RCA and LCx, no PCI, previous h/o cocaine abuse , was admitted on 01/12/2016 with unresponsiveness and is dx with stroke.   1. ARF, non oliguric. Baseline Cr 1.14. DDx ATN, NSAIDs (ibuprofen), Malignant HTN - UOP 1750 cc - S Cr peaked at 5.17, but has now started to improve Electrolytes and Volume status are acceptable No acute indication for Dialysis at present   2. Malignant HTN - Consider esmolol drip to control BP - Goal SBP control to ~ 902 - 111 systolic today UDS positive for marijuana and benzodiazepene - neg for cocaine this time  3. Thrombocytopenia DDx includes Malignant HTN, Phenytoin   4. Proteinuria - will obtain ANA and urine P/C ratio

## 2016-01-16 NOTE — Progress Notes (Signed)
Chaplain rounded the unit and provided a compassionate presence and spiritual support to the patient with silent prayer..  Chaplain Hagan Maltz [phone] 

## 2016-01-16 NOTE — Progress Notes (Signed)
Nutrition Follow-up  DOCUMENTATION CODES:   Obesity unspecified  INTERVENTION:  -Plan to restart TF post CT today per discussed with MD during ICU rounds; recommend continuing current TF regimen at present -Pt continues without BM since admission; discussed during ICU rounds, pt on bowel regimen; if continues without BM, may need further intervention   NUTRITION DIAGNOSIS:   Inadequate oral intake related to acute illness as evidenced by NPO status. Being addressed as restarting TF  GOAL:   Provide needs based on ASPEN/SCCM guidelines  MONITOR:   Vent status, TF tolerance, Labs, Weight trends, I & O's  REASON FOR ASSESSMENT:   Ventilator    ASSESSMENT:    45 yo male admitted with respiratory distress, seizures, NSTEMI; pt intubated on admission 01/12/16 for airway protection. Positive for cannabinoids and benzodiazepines on admission   Plan for CT head, chest, abdomen/pelvis today; per Asher Muir RN, TF held during the night due to whole body cough; plan to restart TF post CT  Patient is currently intubated on ventilator support MV: 16.1 L/min Temp (24hrs), Avg:100.4 F (38 C), Min:98.6 F (37 C), Max:102.8 F (39.3 C)  Diet Order:  Diet NPO time specified Except for: Sips with Meds   Skin:  Reviewed, no issues  Last BM:  no  BM documented since admission; +smear this AM per [facility] RN   Labs: Creatinine 4.33  Glucose Profile:   Recent Labs  01/16/16 0131 01/16/16 0729 01/16/16 1139  GLUCAP 284* 207* 247*   Meds: NS at 75 ml/hr, senokot  Height:   Ht Readings from Last 1 Encounters:  01/12/16 6\' 1"  (1.854 m)    Weight:   Wt Readings from Last 1 Encounters:  01/16/16 281 lb 12 oz (127.8 kg)    Ideal Body Weight:  83.6 kg  BMI:  Body mass index is 37.18 kg/(m^2).  Estimated Nutritional Needs:   Kcal:  3810-1751 kcals using current wt of 126 kg  Protein:  >/= 168 g (2.0 g/kg IBW)  Fluid:  >/=1.7 L  EDUCATION NEEDS:   No education needs  identified at this time  Duane Starcher MS, RD, LDN [phone] Pager  [phone] Weekend/On-Call Pager

## 2016-01-16 NOTE — Progress Notes (Signed)
Subjective: Patient remains unresponsive and on sedation  Objective: Current vital signs: BP 162/101 mmHg  Pulse 89  Temp(Src) 102.7 F (39.3 C) (Rectal)  Resp 26  Ht 6' 1"  (1.854 m)  Wt 127.8 kg (281 lb 12 oz)  BMI 37.18 kg/m2  SpO2 99% Vital signs in last 24 hours: Temp:  [98.6 F (37 C)-102.8 F (39.3 C)] 102.7 F (39.3 C) (04/24 0800) Pulse Rate:  [65-132] 89 (04/24 1100) Resp:  [21-37] 26 (04/24 1100) BP: (133-254)/(85-154) 162/101 mmHg (04/24 1100) SpO2:  [87 %-100 %] 99 % (04/24 1158) FiO2 (%):  [30 %-40 %] 40 % (04/24 1158) Weight:  [127.8 kg (281 lb 12 oz)] 127.8 kg (281 lb 12 oz) (04/24 0600)  Intake/Output from previous day: 04/23 0701 - 04/24 0700 In: 4090 [I.V.:2171; NG/GT:1705; IV Piggyback:214] Out: 1750 [Urine:1750] Intake/Output this shift: Total I/O In: 329 [I.V.:279; IV Piggyback:50] Out: 575 [Urine:575] Nutritional status: Diet NPO time specified Except for: Sips with Meds  Neurologic Exam: Mental Status: Patient does not respond to verbal stimuli. Does not respond to deep sternal rub. Does not follow commands. No verbalizations noted.  Cranial Nerves: II: patient does not respond confrontation bilaterally, pupils pinpoint and unreactive bilaterally III,IV,VI: doll's response absent bilaterally.  V,VII: corneal reflex absent bilaterally VIII: patient does not respond to verbal stimuli IX,X: gag reflex reduced, XI: trapezius strength unable to test bilaterally XII: tongue strength unable to test Motor: Extremities flaccid throughout. No spontaneous movement noted. No purposeful movements noted. Sensory: Does not respond to noxious stimuli in any extremity  Lab Results: Basic Metabolic Panel:  Recent Labs Lab 01/12/16 0134 01/12/16 1319 01/13/16 0601 01/14/16 0610 01/15/16 0559 01/16/16 0237 01/16/16 0514  NA 141 143 143 142  --  142  --   K 3.4* 4.4 3.8 4.5  --  4.2  --   CL 106 110 114* 110  --  115*  --   CO2 24 24 24 23    --  19*  --   GLUCOSE 196* 113* 162* 170*  --  277*  --   BUN 17 17 17  39*  --  70*  --   CREATININE 1.30* 1.27* 1.14 4.01* 5.17* 4.33*  --   CALCIUM 9.9 9.4 8.8* 8.2*  --  7.8*  --   MG  --   --   --   --   --   --  2.2  PHOS  --   --   --   --   --   --  2.3*    Liver Function Tests:  Recent Labs Lab 01/12/16 0134 01/13/16 0601  AST 28 25  ALT 27 20  ALKPHOS 95 61  BILITOT 0.6 0.7  PROT 8.3* 6.4*  ALBUMIN 4.5 3.4*    Recent Labs Lab 01/15/16 0559  LIPASE 29   No results for input(s): AMMONIA in the last 168 hours.  CBC:  Recent Labs Lab 01/12/16 0134 01/13/16 0601 01/14/16 0455 01/16/16 0514  WBC 12.4* 13.5* 11.9* 18.1*  NEUTROABS 5.1  --   --   --   HGB 15.2 13.0 13.2 11.6*  HCT 45.3 39.2* 39.6* 35.2*  MCV 91.1 92.2 92.6 92.3  PLT 317 213 75* 52*    Cardiac Enzymes:  Recent Labs Lab 01/12/16 0134 01/12/16 0551 01/12/16 1319 01/12/16 1814  TROPONINI <0.03 0.04* 0.06* 0.10*    Lipid Panel:  Recent Labs Lab 01/12/16 0551 01/13/16 1809 01/15/16 0559  TRIG 368* 181* 203*    CBG:  Recent Labs Lab 01/15/16 2323 01/16/16 0126 01/16/16 0131 01/16/16 0729 01/16/16 1139  GLUCAP 66 81* 284* 207* 247*    Microbiology: Results for orders placed or performed during the hospital encounter of 01/12/16  MRSA PCR Screening     Status: Abnormal   Collection Time: 01/12/16  6:01 AM  Result Value Ref Range Status   MRSA by PCR POSITIVE (A) NEGATIVE Final    Comment:        The GeneXpert MRSA Assay (FDA approved for NASAL specimens only), is one component of a comprehensive MRSA colonization surveillance program. It is not intended to diagnose MRSA infection nor to guide or monitor treatment for MRSA infections. CRITICAL RESULT CALLED TO, READ BACK BY AND VERIFIED WITH: PAM MYERS 01/12/16 0751 SGD   Culture, blood (Routine X 2) w Reflex to ID Panel     Status: None (Preliminary result)   Collection Time: 01/12/16  4:54 PM  Result Value  Ref Range Status   Specimen Description BLOOD LEFT HAND  Final   Special Requests BOTTLES DRAWN AEROBIC AND ANAEROBIC [place]  Final   Culture NO GROWTH 3 DAYS  Final   Report Status PENDING  Incomplete  Culture, blood (Routine X 2) w Reflex to ID Panel     Status: None (Preliminary result)   Collection Time: 01/12/16  5:04 PM  Result Value Ref Range Status   Specimen Description BLOOD LEFT ARM  Final   Special Requests   Final    BOTTLES DRAWN AEROBIC AND ANAEROBIC  AERO 10CC ANA 5CC   Culture NO GROWTH 3 DAYS  Final   Report Status PENDING  Incomplete  Culture, expectorated sputum-assessment     Status: None   Collection Time: 01/14/16  4:26 AM  Result Value Ref Range Status   Specimen Description TRACHEAL ASPIRATE  Final   Special Requests NONE  Final   Sputum evaluation THIS SPECIMEN IS ACCEPTABLE FOR SPUTUM CULTURE  Final   Report Status 01/14/2016 FINAL  Final  Culture, respiratory (NON-Expectorated)     Status: None   Collection Time: 01/14/16  4:26 AM  Result Value Ref Range Status   Specimen Description TRACHEAL ASPIRATE  Final   Special Requests NONE Reflexed from [id number]  Final   Gram Stain FEW WBC SEEN FEW GRAM POSITIVE COCCI   Final   Culture MODERATE GROWTH STAPHYLOCOCCUS AUREUS  Final   Report Status 01/16/2016 FINAL  Final   Organism ID, Bacteria STAPHYLOCOCCUS AUREUS  Final      Susceptibility   Staphylococcus aureus - MIC*    CIPROFLOXACIN <=0.5 SENSITIVE Sensitive     ERYTHROMYCIN 2 RESISTANT Resistant     GENTAMICIN <=0.5 SENSITIVE Sensitive     OXACILLIN 0.5 SENSITIVE Sensitive     TETRACYCLINE <=1 SENSITIVE Sensitive     VANCOMYCIN 1 SENSITIVE Sensitive     TRIMETH/SULFA <=10 SENSITIVE Sensitive     CLINDAMYCIN <=0.25 RESISTANT Resistant     RIFAMPIN <=0.5 SENSITIVE Sensitive     Inducible Clindamycin Value in next row Resistant      POSITIVEINDUCIBLE CLINDAMYCIN RESISTANCE - A positive ICR test is indicative of inducible resistance to  macrolides, lincosamides, and type B streptogramin.  This isolate is presumed to be resistant to Clindamycin, however, Clindamycin may still be effective in some patients.     * MODERATE GROWTH STAPHYLOCOCCUS AUREUS  Culture, blood (Routine X 2) w Reflex to ID Panel     Status: None (Preliminary result)   Collection Time: 01/14/16  4:46 AM  Result Value Ref Range Status   Specimen Description BLOOD LEFT ASSIST CONTROL  Final   Special Requests   Final    BOTTLES DRAWN AEROBIC AND ANAEROBIC 15CCAERO,12CCANA   Culture NO GROWTH 2 DAYS  Final   Report Status PENDING  Incomplete  Culture, blood (Routine X 2) w Reflex to ID Panel     Status: None (Preliminary result)   Collection Time: 01/14/16  5:06 AM  Result Value Ref Range Status   Specimen Description BLOOD RIGHT ASSIST CONTROL  Final   Special Requests   Final    BOTTLES DRAWN AEROBIC AND ANAEROBIC 12CCAERO,12CCANA   Culture NO GROWTH 1 DAY  Final   Report Status PENDING  Incomplete  Urine culture     Status: None   Collection Time: 01/14/16  6:28 AM  Result Value Ref Range Status   Specimen Description URINE, RANDOM  Final   Special Requests NONE  Final   Culture NO GROWTH 1 DAY  Final   Report Status 01/15/2016 FINAL  Final    Coagulation Studies: No results for input(s): LABPROT, INR in the last 72 hours.  Imaging: Dg Chest Port 1 View  01/16/2016  CLINICAL DATA:  Renal failure. EXAM: PORTABLE CHEST 1 VIEW COMPARISON:  01/15/2016. FINDINGS: Endotracheal tube, NG tube, right IJ line stable position. Cardiomegaly with bilateral pulmonary interstitial prominence and spot small pleural effusions consistent congestive heart failure. No pneumothorax . IMPRESSION: 1. Lines and tubes in stable position. 2. Findings consistent with congestive heart failure with bilateral pulmonary interstitial edema and bilateral small pleural effusions. Electronically Signed   By: Marcello Moores  Register   On: 01/16/2016 07:51   Dg Chest Port 1  View  01/15/2016  CLINICAL DATA:  Acute Respiratory Failure. Follow up vent. EXAM: PORTABLE CHEST 1 VIEW COMPARISON:  01/14/2016 FINDINGS: Endotracheal tube, NG tube, and RIGHT central venous line are unchanged. Stable cardiac silhouette. No effusion, infiltrate or pneumothorax. IMPRESSION: 1. Stable support apparatus.  No interval change. Electronically Signed   By: Suzy Bouchard M.D.   On: 01/15/2016 10:08   Dg Chest Port 1 View  01/14/2016  CLINICAL DATA:  Hypoxia EXAM: PORTABLE CHEST 1 VIEW COMPARISON:  Chest radiograph from earlier today. FINDINGS: Right internal jugular central venous catheter terminates at the cavoatrial junction. Endotracheal tube tip is 3.9 cm above the carina. Enteric tube enters stomach with the tip not seen on this image. Stable cardiomediastinal silhouette with normal heart size. No pneumothorax. No pleural effusion. Slightly low lung volumes. No pulmonary edema. Mild hazy opacity at the right lung base appears increased. IMPRESSION: 1. Well-positioned support structures.  No pneumothorax . 2. Slightly low lung volumes. Increased mild hazy right basilar lung opacity, favor atelectasis, cannot exclude aspiration or developing pneumonia. Electronically Signed   By: Ilona Sorrel M.D.   On: 01/14/2016 17:12    Medications:  I have reviewed the patient's current medications. Scheduled: . antiseptic oral rinse  7 mL Mouth Rinse QID  . aspirin EC  81 mg Oral Daily  . atorvastatin  40 mg Per Tube q1800  . chlorhexidine gluconate (SAGE KIT)  15 mL Mouth Rinse BID  . Chlorhexidine Gluconate Cloth  6 each Topical Q0600  . cloNIDine  0.1 mg Transdermal Weekly  . diazepam  5 mg Per NG tube Q6H  . docusate  100 mg Per Tube BID  . feeding supplement (PRO-STAT SUGAR FREE 64)  30 mL Per Tube BID  . free water  100 mL Per Tube Q8H  .  mupirocin ointment  1 application Nasal BID  . pantoprazole (PROTONIX) IV  40 mg Intravenous Q24H  . phenytoin (DILANTIN) IV  200 mg Intravenous Q8H   . piperacillin-tazobactam (ZOSYN)  IV  3.375 g Intravenous Q12H  . sennosides  5 mL Oral BID    Assessment/Plan: Patient remains unresponsive.  Unable to perform LP due to steady dropping of platelets.  Unclear etiology.  Renal function worsening and white blood cell count increasing.  Remains febrile.  On broad spectrum antibiotics.  Mental status may be toxic/metabooic in origin at this point.    Recommendations: 1.  Agree with current management   LOS: 4 days   Alexis Goodell, MD Neurology [phone] 01/16/2016  12:34 PM

## 2016-01-16 NOTE — Progress Notes (Addendum)
Pharmacy Note - Constipation Prevention  Patient has not had BM since admission on 4/16. Patient was started on docusate 100 mg per tube BID and senna 8.8 mg BID yesterday. No BM documented.   Will order miralax dose today  Cindi Carbon, PharmD 3:31 PM

## 2016-01-16 NOTE — Progress Notes (Signed)
Continued low blood sugar, NP on floor notified of patient condition, orders received to change IVFs to D10 185ml/hr and given 1 amp D50. See CH[facility] for further update, will continue to monitor

## 2016-01-16 NOTE — Progress Notes (Signed)
Patient remains intubated on vent febrile overnight relieved with tylenol and cooling blanket. 0600 temp spike with increased HR and decreased o2 saturation with hypertension.  NP Tukov made aware, meds given. See [facility] for further update. Will continue to assess for changes/need.

## 2016-01-16 NOTE — Progress Notes (Signed)
Changed dose of piperacillin/tazobactam from 3.375 g IV q12h to 3.375 g IV q8h based on renal function.  Duane Chen 3:02 PM

## 2016-01-16 NOTE — Progress Notes (Signed)
Pharmacy Antibiotic Note  Duane HANNINEN Sr. is a 45 y.o. male admitted on 01/12/2016 with respiratory failure .  Pharmacy has been consulted for Vancomycin and Zosyn  dosing.  Plan Patient ordered vancomycin 1250mg  IV Q8H and zosyn 4.5gm IV Q8H.   Now with AKI SCr 1.14 >> 4.01  Will discontinue vancomycin and check trough at 1700 (when next dose would be due) Expect trough to be high due to AKI. May need to dose per levels until renal function improves.  Will also change zosyn dosing. With AKI will dose as though CrCl < 20 as cannot accurately estimate renal function in acute renal failure. Orders changed to zosyn 3.375gm IV q12H extended infusion.   Vancomycin trough resulted at 44. Vancomycin order already discontinued. Will recheck level in 12 hours.   4/23 AM vanc level 30, calculated kei 0.029 hr-1,  t1/2~ 24 hours. Recheck random vanc level tomorrow AM.  4/24 AM vancomycin level 12. Will redose with 1250 mg x1 and recheck level in the AM.   Height: 6\' 1"  (185.4 cm) Weight: 281 lb 12 oz (127.8 kg) IBW/kg (Calculated) : 79.9  Temp (24hrs), Avg:100.1 F (37.8 C), Min:98.6 F (37 C), Max:102.3 F (39.1 C)   Recent Labs Lab 01/12/16 0134 01/12/16 0141 01/12/16 0551 01/12/16 1319 01/13/16 0601 01/14/16 0455 01/14/16 0610 01/14/16 1643 01/15/16 0559 01/16/16 0237 01/16/16 0514  WBC 12.4*  --   --   --  13.5* 11.9*  --   --   --   --  18.1*  CREATININE 1.30*  --   --  1.27* 1.14  --  4.01*  --  5.17* 4.33*  --   LATICACIDVEN  --  5.5* 2.5*  --   --   --   --   --   --   --   --   VANCOTROUGH  --   --   --   --   --   --   --  64*  --   --   --   VANCORANDOM  --   --   --   --   --   --   --   --  30  --  12    Estimated Creatinine Clearance: 30.5 mL/min (by C-G formula based on Cr of 4.33).    No Known Allergies  Antimicrobials this admission:   Vancomycin 4/21 >>   Zosyn 4/21 >>   Dose adjustments this admission:   Microbiology results:  BCx:   UCx:    Sputum:   MRSA PCR: positive   Thank you for allowing pharmacy to be a part of this patient's care.  Aviendha Azbell S 01/16/2016 6:33 AM

## 2016-01-17 ENCOUNTER — Inpatient Hospital Stay: Payer: Medicaid Other

## 2016-01-17 ENCOUNTER — Other Ambulatory Visit: Payer: Self-pay | Admitting: Diagnostic Radiology

## 2016-01-17 ENCOUNTER — Encounter: Payer: Self-pay | Admitting: Infectious Diseases

## 2016-01-17 DIAGNOSIS — R4182 Altered mental status, unspecified: Secondary | ICD-10-CM

## 2016-01-17 DIAGNOSIS — I6340 Cerebral infarction due to embolism of unspecified cerebral artery: Secondary | ICD-10-CM

## 2016-01-17 LAB — CBC
HEMATOCRIT: 32.3 % — AB (ref 40.0–52.0)
Hemoglobin: 10.6 g/dL — ABNORMAL LOW (ref 13.0–18.0)
MCH: 30.8 pg (ref 26.0–34.0)
MCHC: 32.7 g/dL (ref 32.0–36.0)
MCV: 94.2 fL (ref 80.0–100.0)
Platelets: 70 10*3/uL — ABNORMAL LOW (ref 150–440)
RBC: 3.43 MIL/uL — AB (ref 4.40–5.90)
RDW: 14.6 % — AB (ref 11.5–14.5)
WBC: 15.7 10*3/uL — AB (ref 3.8–10.6)

## 2016-01-17 LAB — COMPREHENSIVE METABOLIC PANEL
ALT: 891 U/L — AB (ref 17–63)
AST: 176 U/L — AB (ref 15–41)
Albumin: 2.2 g/dL — ABNORMAL LOW (ref 3.5–5.0)
Alkaline Phosphatase: 59 U/L (ref 38–126)
Anion gap: 5 (ref 5–15)
BILIRUBIN TOTAL: 0.6 mg/dL (ref 0.3–1.2)
BUN: 71 mg/dL — AB (ref 6–20)
CO2: 23 mmol/L (ref 22–32)
CREATININE: 3.46 mg/dL — AB (ref 0.61–1.24)
Calcium: 8.2 mg/dL — ABNORMAL LOW (ref 8.9–10.3)
Chloride: 116 mmol/L — ABNORMAL HIGH (ref 101–111)
GFR, EST AFRICAN AMERICAN: 23 mL/min — AB (ref >60)
GFR, EST NON AFRICAN AMERICAN: 20 mL/min — AB (ref >60)
Glucose, Bld: 137 mg/dL — ABNORMAL HIGH (ref 65–99)
Potassium: 4.8 mmol/L (ref 3.5–5.1)
Sodium: 144 mmol/L (ref 135–145)
TOTAL PROTEIN: 5.9 g/dL — AB (ref 6.5–8.1)

## 2016-01-17 LAB — GLUCOSE, CAPILLARY
Glucose-Capillary: 105 mg/dL — ABNORMAL HIGH (ref 65–99)
Glucose-Capillary: 114 mg/dL — ABNORMAL HIGH (ref 65–99)
Glucose-Capillary: 136 mg/dL — ABNORMAL HIGH (ref 65–99)
Glucose-Capillary: 143 mg/dL — ABNORMAL HIGH (ref 65–99)
Glucose-Capillary: 168 mg/dL — ABNORMAL HIGH (ref 65–99)
Glucose-Capillary: 178 mg/dL — ABNORMAL HIGH (ref 65–99)

## 2016-01-17 LAB — CSF CELL COUNT WITH DIFFERENTIAL
Eosinophils, CSF: 0 %
Lymphs, CSF: 0 %
Monocyte-Macrophage-Spinal Fluid: 0 %
Other Cells, CSF: 0
RBC Count, CSF: 32 /mm3 — ABNORMAL HIGH (ref 0–3)
Segmented Neutrophils-CSF: 0 %
Tube #: 3
WBC, CSF: 0 /mm3

## 2016-01-17 LAB — GLUCOSE, CSF: Glucose, CSF: 102 mg/dL — ABNORMAL HIGH (ref 40–70)

## 2016-01-17 LAB — PROTIME-INR
INR: 1.23
PROTHROMBIN TIME: 15.7 s — AB (ref 11.4–15.0)

## 2016-01-17 LAB — BLOOD GAS, ARTERIAL
ACID-BASE DEFICIT: 3.8 mmol/L — AB (ref 0.0–2.0)
Bicarbonate: 22.6 mEq/L (ref 21.0–28.0)
FIO2: 70
MECHANICAL RATE: 26
O2 Saturation: 99.3 %
PEEP/CPAP: 8 cmH2O
Patient temperature: 37
VT: 550 mL
pCO2 arterial: 46 mmHg (ref 32.0–48.0)
pH, Arterial: 7.3 — ABNORMAL LOW (ref 7.350–7.450)
pO2, Arterial: 159 mmHg — ABNORMAL HIGH (ref 83.0–108.0)

## 2016-01-17 LAB — CULTURE, BLOOD (ROUTINE X 2)
CULTURE: NO GROWTH
Culture: NO GROWTH

## 2016-01-17 LAB — URINE CULTURE: Culture: NO GROWTH

## 2016-01-17 LAB — PROTEIN, CSF: Total  Protein, CSF: 51 mg/dL — ABNORMAL HIGH (ref 15–45)

## 2016-01-17 LAB — RPR: RPR Ser Ql: NONREACTIVE

## 2016-01-17 LAB — VANCOMYCIN, RANDOM: VANCOMYCIN RM: 14 ug/mL

## 2016-01-17 LAB — APTT: APTT: 31 s (ref 24–36)

## 2016-01-17 LAB — ANA W/REFLEX IF POSITIVE: Anti Nuclear Antibody(ANA): NEGATIVE

## 2016-01-17 MED ORDER — LABETALOL HCL 5 MG/ML IV SOLN
20.0000 mg | Freq: Four times a day (QID) | INTRAVENOUS | Status: DC
  Administered 2016-01-17 – 2016-01-19 (×9): 20 mg via INTRAVENOUS
  Filled 2016-01-17 (×10): qty 4

## 2016-01-17 MED ORDER — BISACODYL 10 MG RE SUPP
10.0000 mg | Freq: Once | RECTAL | Status: AC
  Administered 2016-01-17: 10 mg via RECTAL

## 2016-01-17 MED ORDER — STERILE WATER FOR INJECTION IJ SOLN
INTRAMUSCULAR | Status: AC
  Administered 2016-01-17: 10 mL
  Filled 2016-01-17: qty 10

## 2016-01-17 MED ORDER — SODIUM CHLORIDE 0.9 % IV SOLN
100.0000 mg | Freq: Two times a day (BID) | INTRAVENOUS | Status: DC
  Administered 2016-01-17 – 2016-01-19 (×6): 100 mg via INTRAVENOUS
  Filled 2016-01-17 (×9): qty 10

## 2016-01-17 MED ORDER — SODIUM CHLORIDE 0.9 % IV SOLN: Freq: Once | INTRAVENOUS | Status: DC

## 2016-01-17 MED ORDER — SODIUM CHLORIDE 0.45 % IV SOLN
INTRAVENOUS | Status: DC
  Administered 2016-01-17 – 2016-01-19 (×4): via INTRAVENOUS

## 2016-01-17 MED ORDER — STERILE WATER FOR INJECTION IJ SOLN
INTRAMUSCULAR | Status: AC
  Administered 2016-01-17: 16:00:00
  Filled 2016-01-17: qty 10

## 2016-01-17 MED ORDER — SODIUM CHLORIDE 0.9 % IV SOLN
1500.0000 mg | INTRAVENOUS | Status: DC
  Administered 2016-01-17 – 2016-01-19 (×3): 1500 mg via INTRAVENOUS
  Filled 2016-01-17 (×4): qty 1500

## 2016-01-17 NOTE — Care Management (Signed)
Being followed by ID, neurology, cardiology and nephrology in addition to the intensivist.  Vascular inserted CVL.  Will attempt wake up assessment today.  Seizure med changed to Vimpat. REmains on ventilator support Continues  with temp > 101.  WBC 15.7

## 2016-01-17 NOTE — Progress Notes (Signed)
ANTIBIOTIC CONSULT NOTE - INITIAL  Pharmacy Consult for Meropenem Indication: meningitis  No Known Allergies  Patient Measurements: Height: 6\' 1"  (185.4 cm) Weight: 283 lb 15.2 oz (128.8 kg) IBW/kg (Calculated) : 79.9  Vital Signs: Temp: 101.7 F (38.7 C) (04/25 1300) Temp Source: Core (Comment) (04/25 0830) BP: 184/117 mmHg (04/25 1300) Pulse Rate: 97 (04/25 1300) Intake/Output from previous day: 04/24 0701 - 04/25 0700 In: 2358 [I.V.:2023; IV Piggyback:335] Out: 2325 [Urine:2325] Intake/Output from this shift: Total I/O In: 931.8 [I.V.:56.8; IV Piggyback:875] Out: 850 [Urine:850]  Labs:  Recent Labs  01/15/16 0559 01/16/16 0237 01/16/16 0514 01/16/16 1212 01/17/16 0451  WBC  --   --  18.1*  --  15.7*  HGB  --   --  11.6*  --  10.6*  PLT  --   --  52*  --  70*  LABCREA  --   --   --  86  --   CREATININE 5.17* 4.33*  --   --  3.46*   Estimated Creatinine Clearance: 38.3 mL/min (by C-G formula based on Cr of 3.46).  Recent Labs  01/14/16 1643  01/16/16 0514 01/17/16 0451  VANCOTROUGH 44*  --   --   --   VANCORANDOM  --   < > 12 14  < > = values in this interval not displayed.    Assessment: Pharmacy consulted to dose meropenem for meningitis. Patient is currently on day #2 of meropenem.   Goal of Therapy:  resolution of infection   Plan:  Continue meropenem 2 gm IV Q12H based on renal function and indication.  Thank you for the consult.   Cindi Carbon, PharmD Clinical Pharmacist 01/17/2016,1:51 PM

## 2016-01-17 NOTE — Progress Notes (Signed)
[facility] Medicine Progess Note    ASSESSMENT/PLAN   45 year old male past medical history of hypertension, coronary artery disease, admitted for unresponsiveness and respiratory failure Found to have acute RT sided CVA-thalamic stroke requiring full vent support. Course, complicated by persistent fevers, source uncertain.  PULMONARY Respiratory failure-unresponsive VDRF Need for MV -Seizure Lactic acidosis R Thalamic Lacunar infarct Chest x-ray 4/25 review, improved right atelectasis versus infiltrate noted. CT chest reviewed; RLL pneumonia and atelectasis.   P:  - cont with MV, wean as tolerated - maintain O2 sats >88% - SBT/SAT as tolerated - prn ABG - prn Bronchodilators - fentanyl/versed  - PCT>10.73 -Repeat cultures pending  CARDIOVASCULAR CVL RIJ A:  CAD HTN Hx of MI Hx of Coronary Angioplasty with stent placement-hemodynamically stable Possible NSTEMI Now with elevated blood pressure P:  -monitor BP - RIJ with guide wire in place - s\p guide wire removal by vascular surgery, and placement of new RIJ CVL - maintain SBP<140 - PRN labetalol/hydralazine, may need to start on the nicardipine infusion. If blood pressures remain elevated. - cardiology following, mild troponin elevation, heparin gtt stopped  RENAL Elevated Creatinine P:  - BMP as needed - avoid nephrotoxic drugs - possibly due to chronic HTN   GASTROINTESTINAL Etoh Use Marijuana Use - PPI - tube feeds. Elevated transaminases, possibly due to sepsis, will check ultrasound of RUQ.   HEMATOLOGIC -CBC, leukocytosis, suspect due to sepsis.   INFECTIOUS A: high fevers P:  - f\u cultures - EMPIRIC ABX -F/U PCT LP when platelets above 100, will transfuse platelets tonight for possible LP tomorrow.   Micro/culture results: BC  4/20-22-24>> negative UC 4/22&24>> negative thus far Sputum 4/22>> gram-positive cocci, further results pending. MRSA  POSITIVE  Antibiotics: Antimicrobials this admission: Vancomycin 4/21 >>  Zosyn 4/21 >>    ENDOCRINE -ICU Hypo/hyperglycemia protocol -Hypoglycemia overnight, the patient is currently on D5 normal saline at 125 mL an hour plus D10 infusion plus vital high protein.  NEUROLOGIC A:  Unresponsiveness Possible Seizure episode.  R Thalamic Lacunar Infarct P:  RASS goal: -1,0 - neuro following - seizure precautions.  - AEDs (Dilantin) - BP control  -Neurologist will plan for Lumbar Puncture   Best Practice: Code Status: Full. Diet: NPO GI prophylaxis: PPI. VTE prophylaxis: SCD's / F/U platelets, if normal Schedule SQ heaprin   MAJOR EVENTS/TEST RESULTS: EEG 01/13/16 IMPRESSION: This is an abnormal electroencephalogram due to general background slowing. Findings are consistent with current medications usage. No epileptiform activity is noted.   Echocardiogram 01/08/16 EF 55%, no other significant abnormalities noted.      ---------------------------------------   ----------------------------------------   Name: Duane Ada Sr. MRN: [id number] DOB: 06/20/1971    ADMISSION DATE:  01/12/2016     Pt currently on the ventilator, can not provide history or review of systems.     VITAL SIGNS: Temp:  [97 F (36.1 C)-101.8 F (38.8 C)] 101.8 F (38.8 C) (04/25 1200) Pulse Rate:  [70-98] 98 (04/25 1200) Resp:  [19-33] 33 (04/25 1200) BP: (114-184)/(78-126) 184/117 mmHg (04/25 1200) SpO2:  [86 %-100 %] 95 % (04/25 1200) FiO2 (%):  [40 %-80 %] 80 % (04/25 1200) Weight:  [283 lb 15.2 oz (128.8 kg)] 283 lb 15.2 oz (128.8 kg) (04/25 0500) HEMODYNAMICS:   VENTILATOR SETTINGS: Vent Mode:  [-] PRVC FiO2 (%):  [40 %-80 %] 80 % Set Rate:  [26 bmp] 26 bmp Vt Set:  [550 mL] 550 mL PEEP:  [8 cmH20-10 cmH20] 10 cmH20 Plateau Pressure:  [  19 cmH20] 19 cmH20 INTAKE / OUTPUT:  Intake/Output Summary (Last 24 hours) at 01/17/16 1308 Last data filed at  01/17/16 1200  Gross per 24 hour  Intake 2960.79 ml  Output   2225 ml  Net 735.79 ml    PHYSICAL EXAMINATION: Physical Examination:   VS: BP 184/117 mmHg  Pulse 98  Temp(Src) 101.8 F (38.8 C) (Core (Comment))  Resp 33  Ht 6\' 1"  (1.854 m)  Wt 283 lb 15.2 oz (128.8 kg)  BMI 37.47 kg/m2  SpO2 95%  General Appearance: No distress  Neuro:without focal findings, mental status unresponsive.  HEENT: PERRLA, EOM intact. Pulmonary: normal breath sounds  Decreased bilaterally.  CardiovascularNormal S1,S2.  No m/r/g.   Abdomen: Benign, Soft, non-tender. Renal:  No costovertebral tenderness  GU:  Not performed at this time. Endocrine: No evident thyromegaly. Skin:   warm, no rashes, no ecchymosis  Extremities: normal, no cyanosis, clubbing.   LABS:   LABORATORY PANEL:   CBC  Recent Labs Lab 01/17/16 0451  WBC 15.7*  HGB 10.6*  HCT 32.3*  PLT 70*    Chemistries   Recent Labs Lab 01/16/16 0514 01/17/16 0451  NA  --  144  K  --  4.8  CL  --  116*  CO2  --  23  GLUCOSE  --  137*  BUN  --  71*  CREATININE  --  3.46*  CALCIUM  --  8.2*  MG 2.2  --   PHOS 2.3*  --   AST 407* 176*  ALT 1258* 891*  ALKPHOS 60 59  BILITOT 0.8 0.6     Recent Labs Lab 01/16/16 1651 01/16/16 1945 01/16/16 2350 01/17/16 0450 01/17/16 0737 01/17/16 1129  GLUCAP 240* 198* 178* 114* 105* 136*    Recent Labs Lab 01/14/16 1620 01/14/16 2114 01/17/16 0500  PHART 7.15* 7.29* 7.30*  PCO2ART 59* 44 46  PO2ART 191* 402* 159*    Recent Labs Lab 01/13/16 0601 01/16/16 0514 01/17/16 0451  AST 25 407* 176*  ALT 20 1258* 891*  ALKPHOS 61 60 59  BILITOT 0.7 0.8 0.6  ALBUMIN 3.4* 2.3* 2.2*    Cardiac Enzymes  Recent Labs Lab 01/12/16 1814  TROPONINI 0.10*    RADIOLOGY:  Ct Abdomen Pelvis Wo Contrast  01/16/2016  CLINICAL DATA:  Fevers of uncertain source.  Renal insufficiency. EXAM: CT CHEST, ABDOMEN AND PELVIS WITHOUT CONTRAST TECHNIQUE: Multidetector CT imaging  of the chest, abdomen and pelvis was performed following the standard protocol without IV contrast. COMPARISON:  Portable chest obtained earlier today. Chest, abdomen and pelvis CT dated 05/02/2010. FINDINGS: CT CHEST FINDINGS Mediastinum/Lymph Nodes: Endotracheal tube tip above the carina. Nasogastric or oral gastric tube extending into the stomach. Mild atheromatous coronary artery calcifications. No enlarged lymph nodes. Lungs/Pleura: Bilateral lower lobe atelectasis. Extensive patchy opacity in the right lower lobe. Mild patchy opacity in the adjacent inferior aspect of the right upper lobe. Small amount of atelectasis in the posterior, inferior aspect of the left upper lobe. No lung nodules or pleural fluid. Musculoskeletal: Mild thoracic spine degenerative changes. CT ABDOMEN PELVIS FINDINGS Hepatobiliary: Unremarkable liver and gallbladder. Pancreas: No mass or inflammatory process identified on this un-enhanced exam. Spleen: Within normal limits in size and appearance. Adrenals/Urinary Tract: Foley catheter in the urinary bladder with associated air in the bladder. No urinary tract calculi or hydronephrosis. Normal appearing adrenal glands. Stomach/Bowel: Orogastric or nasogastric tube tip in the proximal stomach. No dilated bowel loops or evidence of appendicitis. The appendix is retrocecal  in location and has a normal appearance. Vascular/Lymphatic: Mild atheromatous arterial calcifications. No aneurysm or enlarged lymph nodes. Reproductive: The left testis is in the inferior aspect of the left inguinal canal. Normal sized prostate gland. Other: Small proximal left inguinal hernia containing fat. Musculoskeletal:  Mild lumbar spine degenerative changes. IMPRESSION: 1. Extensive right lower lobe pneumonia and small amount of pneumonia in the adjacent right upper lobe. 2. Bilateral lower lobe atelectasis. 3. Incompletely descended or retracted left testis in the inferior aspect of the left inguinal canal. 4.  Small proximal left inguinal hernia containing fat. 5. Mild atheromatous coronary artery calcifications. Electronically Signed   By: Beckie Salts M.D.   On: 01/16/2016 19:17   Dg Chest 1 View  01/17/2016  CLINICAL DATA:  Shortness of breath. EXAM: CHEST 1 VIEW COMPARISON:  CT 01/16/2016.  Chest x-ray 01/16/2016 . FINDINGS: Endotracheal tube and NG tube in stable position. Right IJ line in stable position. Cardiomegaly. Interim significant partial clearing of basilar infiltrates/edema with mild residual, particular in the right lung base. Small right pleural effusion. No pneumothorax. IMPRESSION: 1. Lines and tubes in stable position. 2. Interim significant partial clearing of bilateral basilar infiltrates/edema. Persistent small right pleural effusion. 3. Stable cardiomegaly . Electronically Signed   By: Maisie Fus  Register   On: 01/17/2016 07:24   Ct Head Wo Contrast  01/16/2016  CLINICAL DATA:  Followup acute right thalamic stroke. EXAM: CT HEAD WITHOUT CONTRAST TECHNIQUE: Contiguous axial images were obtained from the base of the skull through the vertex without intravenous contrast. COMPARISON:  01/14/2016. FINDINGS: The previously demonstrated oval area of low density in the upper right thalamus is smaller and less distinct today. No intracranial hemorrhage or mass effect. Normal size and position of the ventricles. No new abnormalities. Almost complete opacification of the left maxillary and sphenoid sinuses is again demonstrated with left frontal, left ethmoid and right sphenoid sinus mucosal thickening. IMPRESSION: 1. No acute abnormality. 2. Interval decrease in size and conspicuity of a right thalamic infarct. 3. Extensive chronic sinusitis. Electronically Signed   By: Beckie Salts M.D.   On: 01/16/2016 18:34   Ct Chest Wo Contrast  01/16/2016  CLINICAL DATA:  Fevers of uncertain source.  Renal insufficiency. EXAM: CT CHEST, ABDOMEN AND PELVIS WITHOUT CONTRAST TECHNIQUE: Multidetector CT imaging of  the chest, abdomen and pelvis was performed following the standard protocol without IV contrast. COMPARISON:  Portable chest obtained earlier today. Chest, abdomen and pelvis CT dated 05/02/2010. FINDINGS: CT CHEST FINDINGS Mediastinum/Lymph Nodes: Endotracheal tube tip above the carina. Nasogastric or oral gastric tube extending into the stomach. Mild atheromatous coronary artery calcifications. No enlarged lymph nodes. Lungs/Pleura: Bilateral lower lobe atelectasis. Extensive patchy opacity in the right lower lobe. Mild patchy opacity in the adjacent inferior aspect of the right upper lobe. Small amount of atelectasis in the posterior, inferior aspect of the left upper lobe. No lung nodules or pleural fluid. Musculoskeletal: Mild thoracic spine degenerative changes. CT ABDOMEN PELVIS FINDINGS Hepatobiliary: Unremarkable liver and gallbladder. Pancreas: No mass or inflammatory process identified on this un-enhanced exam. Spleen: Within normal limits in size and appearance. Adrenals/Urinary Tract: Foley catheter in the urinary bladder with associated air in the bladder. No urinary tract calculi or hydronephrosis. Normal appearing adrenal glands. Stomach/Bowel: Orogastric or nasogastric tube tip in the proximal stomach. No dilated bowel loops or evidence of appendicitis. The appendix is retrocecal in location and has a normal appearance. Vascular/Lymphatic: Mild atheromatous arterial calcifications. No aneurysm or enlarged lymph nodes. Reproductive: The left  testis is in the inferior aspect of the left inguinal canal. Normal sized prostate gland. Other: Small proximal left inguinal hernia containing fat. Musculoskeletal:  Mild lumbar spine degenerative changes. IMPRESSION: 1. Extensive right lower lobe pneumonia and small amount of pneumonia in the adjacent right upper lobe. 2. Bilateral lower lobe atelectasis. 3. Incompletely descended or retracted left testis in the inferior aspect of the left inguinal canal. 4.  Small proximal left inguinal hernia containing fat. 5. Mild atheromatous coronary artery calcifications. Electronically Signed   By: Beckie Salts M.D.   On: 01/16/2016 19:17   US Renal  01/16/2016  CLINICAL DATA:  Acute renal failure EXAM: RENAL / URINARY TRACT ULTRASOUND COMPLETE COMPARISON:  CT 05/02/2010 FINDINGS: Right Kidney: Length: 11.8 cm. Parenchyma echogenic compared to the adjacent liver. No mass or hydronephrosis visualized. Left Kidney: Length: 12 cm. Echogenicity within normal limits. No mass or hydronephrosis visualized. Bladder: Decompressed by catheter. IMPRESSION: 1. Negative for hydronephrosis. 2. Echogenic renal parenchyma, a nonspecific indicator of medical renal disease. Electronically Signed   By: Corlis Leak M.D.   On: 01/16/2016 15:44   Dg Chest Port 1 View  01/16/2016  CLINICAL DATA:  Renal failure. EXAM: PORTABLE CHEST 1 VIEW COMPARISON:  01/15/2016. FINDINGS: Endotracheal tube, NG tube, right IJ line stable position. Cardiomegaly with bilateral pulmonary interstitial prominence and spot small pleural effusions consistent congestive heart failure. No pneumothorax . IMPRESSION: 1. Lines and tubes in stable position. 2. Findings consistent with congestive heart failure with bilateral pulmonary interstitial edema and bilateral small pleural effusions. Electronically Signed   By: Maisie Fus  Register   On: 01/16/2016 07:51   US Abdomen Limited Ruq  01/17/2016  CLINICAL DATA:  Sepsis.  Ventilated patient. EXAM: US ABDOMEN LIMITED - RIGHT UPPER QUADRANT COMPARISON:  CT 01/16/2016 and previous FINDINGS: Gallbladder: No gallstones or wall thickening visualized. No sonographic Murphy sign noted by sonographer. Only supine images could be obtained. Common bile duct: Diameter: 4.1 mm Liver: No focal lesion identified. Within normal limits in parenchymal echogenicity. IMPRESSION: Negative.  Normal gallbladder. Electronically Signed   By: Corlis Leak M.D.   On: 01/17/2016 12:06       --Wells Guiles, MD.  ICU Pager: [phone] [facility] Office Number: [phone]  Santiago Glad, M.D.  Stephanie Acre, M.D.  Billy Fischer, M.D  01/17/2016  Critical Care Attestation.  I have personally obtained a history, examined the patient, evaluated laboratory and imaging results, formulated the assessment and plan and placed orders. The Patient requires high complexity decision making for assessment and support, frequent evaluation and titration of therapies, application of advanced monitoring technologies and extensive interpretation of multiple databases. The patient has critical illness that could lead imminently to failure of 1 or more organ systems and requires the highest level of physician preparedness to intervene.  Critical Care Time devoted to patient care services described in this note is 45 minutes and is exclusive of time spent in procedures.

## 2016-01-17 NOTE — Progress Notes (Signed)
Patient ID: Duane BE Sr., male   DOB: [birth year], 45 y.o.   MRN: [id number] Pt. Stable after L.P--hemostasis achieved. Pt . Sent back to ICU under care of nursing and respitory therapy.

## 2016-01-17 NOTE — Progress Notes (Signed)
Pharmacy Note - Constipation Prevention  Patient has not had BM since admission on 4/16. Patient is currently on the following bowel regimen:  Docusate 100 mg per tube BID Senna 8.6 mg BID Miralax daily  Will order bisacodyl suppository x 1 dose today  Cindi Carbon, PharmD 1:56 PM

## 2016-01-17 NOTE — Progress Notes (Signed)
Pharmacy Antibiotic Note  Duane BENO Sr. is a 45 y.o. male admitted on 01/12/2016 with respiratory failure .  Pharmacy has been consulted for Vancomycin and Zosyn  dosing.  Plan Vancomycin administrations this admission 0421 1253 1.75 gm          2000 1.25 gm  0422 0200 1.25 gm          1030 1.25 gm          1643 trough 44 mcg/mL  0423 0559 random 30 mcg/mL  0424 0514 random 12 mcg/mL          0700 1.25 gm   0425 0451 random 14 mcg/mL          0700 1.5 gm  Creatinine clearance improving, SCr 3.46 today, new Ke 0.036 hr-1, T1/2 19 hr. Ordered 1.5 gm IV Q24H and will recheck random level tomorrow before admin.  Height: 6\' 1"  (185.4 cm) Weight: 283 lb 15.2 oz (128.8 kg) IBW/kg (Calculated) : 79.9  Temp (24hrs), Avg:99.2 F (37.3 C), Min:97 F (36.1 C), Max:102.8 F (39.3 C)   Recent Labs Lab 01/12/16 0134 01/12/16 0141 01/12/16 0551  01/13/16 0601 01/14/16 0455 01/14/16 0610 01/14/16 1643  01/15/16 0559 01/16/16 0237 01/16/16 0514 01/17/16 0451  WBC 12.4*  --   --   --  13.5* 11.9*  --   --   --   --   --  18.1* 15.7*  CREATININE 1.30*  --   --   < > 1.14  --  4.01*  --   --  5.17* 4.33*  --  3.46*  LATICACIDVEN  --  5.5* 2.5*  --   --   --   --   --   --   --   --   --   --   VANCOTROUGH  --   --   --   --   --   --   --  3*  --   --   --   --   --   [place]  --   --   --   --   --   --   --   --   < > 30  --  12 14  < > = values in this interval not displayed.  Estimated Creatinine Clearance: 38.3 mL/min (by C-G formula based on Cr of 3.46).    No Known Allergies  Antimicrobials this admission:   Vancomycin 4/21 >>   Zosyn 4/21 >>   Dose adjustments this admission:   Microbiology results:  BCx:   UCx:    Sputum:   MRSA PCR: positive   Thank you for allowing pharmacy to be a part of this patient's care.  Carola Frost, Pharm.D., BCPS Clinical Pharmacist 01/17/2016 6:32 AM

## 2016-01-17 NOTE — Progress Notes (Signed)
ID Enote Spoke with RN- fever improving and HD more stable. Having extensive cough with secretions. On sedation so unable to assess ment status but she is holding sedation for wake up assessment currently. HIV neg, RPR neg. Plts up to 70. CT shows extensive PNA, CT head shows decreased size of infarct. I have spoken with the health dept regarding possibility of rabies which would be very unusual except for the history of his exposure to a dead dog 2 weeks ago.   Will check PT PTT and discuss with IR doing LP. Would greatly benefit from MRI but cannot do at this time. Will see this PM

## 2016-01-17 NOTE — Progress Notes (Signed)
[facility] PROGRESS NOTE Date of Admission:  01/12/2016     ID: Duane Myrtle Sr. is a 45 y.o. male with AMS, fevers,  Active Problems:   Respiratory distress   Seizure-like activity (HCC)   Acute respiratory failure with hypoxia (HCC)   Hypertensive encephalopathy   Respiratory failure (HCC)   Subjective: Remains unresponsive, fevers recurred. CT chest shows RLL infiltrate.   ROS  Unable to obtain  Medications:  Antibiotics Given (last 72 hours)    Date/Time Action Medication Dose Rate   01/14/16 2200 Given   piperacillin-tazobactam (ZOSYN) IVPB 3.375 g 3.375 g 12.5 mL/hr   01/15/16 1035 Given   piperacillin-tazobactam (ZOSYN) IVPB 3.375 g 3.375 g 12.5 mL/hr   01/15/16 2205 Given   piperacillin-tazobactam (ZOSYN) IVPB 3.375 g 3.375 g 12.5 mL/hr   01/16/16 0707 Given   vancomycin (VANCOCIN) 1,250 mg in sodium chloride 0.9 % 250 mL IVPB 1,250 mg 166.7 mL/hr   01/16/16 1017 Given   piperacillin-tazobactam (ZOSYN) IVPB 3.375 g 3.375 g 12.5 mL/hr   01/16/16 1514 Given   piperacillin-tazobactam (ZOSYN) IVPB 3.375 g 3.375 g 12.5 mL/hr   01/16/16 2130 Given   doxycycline (VIBRAMYCIN) 100 mg in dextrose 5 % 250 mL IVPB 100 mg 125 mL/hr   01/17/16 0006 Given   meropenem (MERREM) 2 g in sodium chloride 0.9 % 100 mL IVPB 2 g 200 mL/hr   01/17/16 0858 Given   vancomycin (VANCOCIN) 1,500 mg in sodium chloride 0.9 % 500 mL IVPB 1,500 mg 250 mL/hr   01/17/16 1150 Given   doxycycline (VIBRAMYCIN) 100 mg in dextrose 5 % 250 mL IVPB 100 mg 125 mL/hr   01/17/16 1150 Given   meropenem (MERREM) 2 g in sodium chloride 0.9 % 100 mL IVPB 2 g 200 mL/hr     . sodium chloride   Intravenous Once  . antiseptic oral rinse  7 mL Mouth Rinse QID  . aspirin EC  81 mg Oral Daily  . chlorhexidine gluconate (SAGE KIT)  15 mL Mouth Rinse BID  . Chlorhexidine Gluconate Cloth  6 each Topical Q0600  . cloNIDine  0.1 mg Transdermal Weekly  . diazepam  5 mg Per NG tube Q6H  .  docusate  100 mg Per Tube BID  . doxycycline (VIBRAMYCIN) IV  100 mg Intravenous Q12H  . feeding supplement (PRO-STAT SUGAR FREE 64)  30 mL Per Tube BID  . free water  100 mL Per Tube Q8H  . labetalol  20 mg Intravenous Q6H  . lacosamide (VIMPAT) IV  100 mg Intravenous Q12H  . meropenem (MERREM) IV  2 g Intravenous Q12H  . mupirocin ointment  1 application Nasal BID  . pantoprazole (PROTONIX) IV  40 mg Intravenous Q24H  . polyethylene glycol  17 g Per Tube Daily  . sennosides  5 mL Oral BID  . sterile water (preservative free)      . vancomycin  1,500 mg Intravenous Q24H    Objective: Vital signs in last 24 hours: Temp:  [97 F (36.1 C)-101.8 F (38.8 C)] 101.7 F (38.7 C) (04/25 1300) Pulse Rate:  [70-98] 97 (04/25 1300) Resp:  [19-33] 26 (04/25 1300) BP: (114-184)/(78-126) 184/117 mmHg (04/25 1300) SpO2:  [86 %-100 %] 97 % (04/25 1300) FiO2 (%):  [40 %-80 %] 80 % (04/25 1300) Weight:  [128.8 kg (283 lb 15.2 oz)] 128.8 kg (283 lb 15.2 oz) (04/25 0500) Constitutional: crtically ill appearing, unresponsive even to sternal rub, on fentanyl versed HENT: anicteric pupils  pinpoint Mouth/Throat: Oropharynx ett and ogt in place  Neck is supple  Cardiovascular: tachy, reg Pulmonary/Chest: Effort normal and breath sounds normal.  Abdominal: Soft. Bowel sounds are normal. He exhibits no distension. There is no tenderness.  Lymphadenopathy: He has no cervical adenopathy.  Neurological: unresponsive, no clonus,  Skin: Skin is warm and dry. No rash noted. No erythema.  Psychiatric: unresponisive Lines- R IJ wnl Foley in place  Lab Results  Recent Labs  01/16/16 0237 01/16/16 0514 01/17/16 0451  WBC  --  18.1* 15.7*  HGB  --  11.6* 10.6*  HCT  --  35.2* 32.3*  NA 142  --  144  K 4.2  --  4.8  CL 115*  --  116*  CO2 19*  --  23  BUN 70*  --  71*  CREATININE 4.33*  --  3.46*    Microbiology: Results for orders placed or performed during the hospital encounter of  01/12/16  MRSA PCR Screening     Status: Abnormal   Collection Time: 01/12/16  6:01 AM  Result Value Ref Range Status   MRSA by PCR POSITIVE (A) NEGATIVE Final    Comment:        The GeneXpert MRSA Assay (FDA approved for NASAL specimens only), is one component of a comprehensive MRSA colonization surveillance program. It is not intended to diagnose MRSA infection nor to guide or monitor treatment for MRSA infections. CRITICAL RESULT CALLED TO, READ BACK BY AND VERIFIED WITH: PAM MYERS 01/12/16 0751 SGD   Culture, blood (Routine X 2) w Reflex to ID Panel     Status: None (Preliminary result)   Collection Time: 01/12/16  4:54 PM  Result Value Ref Range Status   Specimen Description BLOOD LEFT HAND  Final   Special Requests BOTTLES DRAWN AEROBIC AND ANAEROBIC [place]  Final   Culture NO GROWTH 3 DAYS  Final   Report Status PENDING  Incomplete  Culture, blood (Routine X 2) w Reflex to ID Panel     Status: None (Preliminary result)   Collection Time: 01/12/16  5:04 PM  Result Value Ref Range Status   Specimen Description BLOOD LEFT ARM  Final   Special Requests   Final    BOTTLES DRAWN AEROBIC AND ANAEROBIC  AERO 10CC ANA 5CC   Culture NO GROWTH 3 DAYS  Final   Report Status PENDING  Incomplete  Culture, expectorated sputum-assessment     Status: None   Collection Time: 01/14/16  4:26 AM  Result Value Ref Range Status   Specimen Description TRACHEAL ASPIRATE  Final   Special Requests NONE  Final   Sputum evaluation THIS SPECIMEN IS ACCEPTABLE FOR SPUTUM CULTURE  Final   Report Status 01/14/2016 FINAL  Final  Culture, respiratory (NON-Expectorated)     Status: None   Collection Time: 01/14/16  4:26 AM  Result Value Ref Range Status   Specimen Description TRACHEAL ASPIRATE  Final   Special Requests NONE Reflexed from [id number]  Final   Gram Stain FEW WBC SEEN FEW GRAM POSITIVE COCCI   Final   Culture MODERATE GROWTH STAPHYLOCOCCUS AUREUS  Final   Report Status 01/16/2016  FINAL  Final   Organism ID, Bacteria STAPHYLOCOCCUS AUREUS  Final      Susceptibility   Staphylococcus aureus - MIC*    CIPROFLOXACIN <=0.5 SENSITIVE Sensitive     ERYTHROMYCIN 2 RESISTANT Resistant     GENTAMICIN <=0.5 SENSITIVE Sensitive     OXACILLIN 0.5 SENSITIVE Sensitive  TETRACYCLINE <=1 SENSITIVE Sensitive     VANCOMYCIN 1 SENSITIVE Sensitive     TRIMETH/SULFA <=10 SENSITIVE Sensitive     CLINDAMYCIN <=0.25 RESISTANT Resistant     RIFAMPIN <=0.5 SENSITIVE Sensitive     Inducible Clindamycin Value in next row Resistant      POSITIVEINDUCIBLE CLINDAMYCIN RESISTANCE - A positive ICR test is indicative of inducible resistance to macrolides, lincosamides, and type B streptogramin.  This isolate is presumed to be resistant to Clindamycin, however, Clindamycin may still be effective in some patients.     * MODERATE GROWTH STAPHYLOCOCCUS AUREUS  Culture, blood (Routine X 2) w Reflex to ID Panel     Status: None (Preliminary result)   Collection Time: 01/14/16  4:46 AM  Result Value Ref Range Status   Specimen Description BLOOD LEFT ASSIST CONTROL  Final   Special Requests   Final    BOTTLES DRAWN AEROBIC AND ANAEROBIC 15CCAERO,12CCANA   Culture NO GROWTH 3 DAYS  Final   Report Status PENDING  Incomplete  Culture, blood (Routine X 2) w Reflex to ID Panel     Status: None (Preliminary result)   Collection Time: 01/14/16  5:06 AM  Result Value Ref Range Status   Specimen Description BLOOD RIGHT ASSIST CONTROL  Final   Special Requests   Final    BOTTLES DRAWN AEROBIC AND ANAEROBIC 12CCAERO,12CCANA   Culture NO GROWTH 1 DAY  Final   Report Status PENDING  Incomplete  Urine culture     Status: None   Collection Time: 01/14/16  6:28 AM  Result Value Ref Range Status   Specimen Description URINE, RANDOM  Final   Special Requests NONE  Final   Culture NO GROWTH 1 DAY  Final   Report Status 01/15/2016 FINAL  Final  Urine culture     Status: None   Collection Time: 01/16/16  8:58 AM   Result Value Ref Range Status   Specimen Description URINE, RANDOM  Final   Special Requests NONE  Final   Culture NO GROWTH 1 DAY  Final   Report Status 01/17/2016 FINAL  Final  Culture, expectorated sputum-assessment     Status: None   Collection Time: 01/16/16 12:10 PM  Result Value Ref Range Status   Specimen Description TRACHEAL ASPIRATE  Final   Special Requests NONE  Final   Sputum evaluation THIS SPECIMEN IS ACCEPTABLE FOR SPUTUM CULTURE  Final   Report Status 01/16/2016 FINAL  Final  Culture, respiratory (NON-Expectorated)     Status: None (Preliminary result)   Collection Time: 01/16/16 12:10 PM  Result Value Ref Range Status   Specimen Description TRACHEAL ASPIRATE  Final   Special Requests NONE Reflexed from [id number]  Final   Gram Stain RARE WBC SEEN NO ORGANISMS SEEN   Final   Culture HOLDING FOR POSSIBLE PATHOGEN  Final   Report Status PENDING  Incomplete    Studies/Results: Ct Abdomen Pelvis Wo Contrast  01/16/2016  CLINICAL DATA:  Fevers of uncertain source.  Renal insufficiency. EXAM: CT CHEST, ABDOMEN AND PELVIS WITHOUT CONTRAST TECHNIQUE: Multidetector CT imaging of the chest, abdomen and pelvis was performed following the standard protocol without IV contrast. COMPARISON:  Portable chest obtained earlier today. Chest, abdomen and pelvis CT dated 05/02/2010. FINDINGS: CT CHEST FINDINGS Mediastinum/Lymph Nodes: Endotracheal tube tip above the carina. Nasogastric or oral gastric tube extending into the stomach. Mild atheromatous coronary artery calcifications. No enlarged lymph nodes. Lungs/Pleura: Bilateral lower lobe atelectasis. Extensive patchy opacity in the right lower lobe. Mild patchy  opacity in the adjacent inferior aspect of the right upper lobe. Small amount of atelectasis in the posterior, inferior aspect of the left upper lobe. No lung nodules or pleural fluid. Musculoskeletal: Mild thoracic spine degenerative changes. CT ABDOMEN PELVIS FINDINGS Hepatobiliary:  Unremarkable liver and gallbladder. Pancreas: No mass or inflammatory process identified on this un-enhanced exam. Spleen: Within normal limits in size and appearance. Adrenals/Urinary Tract: Foley catheter in the urinary bladder with associated air in the bladder. No urinary tract calculi or hydronephrosis. Normal appearing adrenal glands. Stomach/Bowel: Orogastric or nasogastric tube tip in the proximal stomach. No dilated bowel loops or evidence of appendicitis. The appendix is retrocecal in location and has a normal appearance. Vascular/Lymphatic: Mild atheromatous arterial calcifications. No aneurysm or enlarged lymph nodes. Reproductive: The left testis is in the inferior aspect of the left inguinal canal. Normal sized prostate gland. Other: Small proximal left inguinal hernia containing fat. Musculoskeletal:  Mild lumbar spine degenerative changes. IMPRESSION: 1. Extensive right lower lobe pneumonia and small amount of pneumonia in the adjacent right upper lobe. 2. Bilateral lower lobe atelectasis. 3. Incompletely descended or retracted left testis in the inferior aspect of the left inguinal canal. 4. Small proximal left inguinal hernia containing fat. 5. Mild atheromatous coronary artery calcifications. Electronically Signed   By: Claudie Revering M.D.   On: 01/16/2016 19:17   Dg Chest 1 View  01/17/2016  CLINICAL DATA:  Shortness of breath. EXAM: CHEST 1 VIEW COMPARISON:  CT 01/16/2016.  Chest x-ray 01/16/2016 . FINDINGS: Endotracheal tube and NG tube in stable position. Right IJ line in stable position. Cardiomegaly. Interim significant partial clearing of basilar infiltrates/edema with mild residual, particular in the right lung base. Small right pleural effusion. No pneumothorax. IMPRESSION: 1. Lines and tubes in stable position. 2. Interim significant partial clearing of bilateral basilar infiltrates/edema. Persistent small right pleural effusion. 3. Stable cardiomegaly . Electronically Signed   By:  Marcello Moores  Register   On: 01/17/2016 07:24   Ct Head Wo Contrast  01/16/2016  CLINICAL DATA:  Followup acute right thalamic stroke. EXAM: CT HEAD WITHOUT CONTRAST TECHNIQUE: Contiguous axial images were obtained from the base of the skull through the vertex without intravenous contrast. COMPARISON:  01/14/2016. FINDINGS: The previously demonstrated oval area of low density in the upper right thalamus is smaller and less distinct today. No intracranial hemorrhage or mass effect. Normal size and position of the ventricles. No new abnormalities. Almost complete opacification of the left maxillary and sphenoid sinuses is again demonstrated with left frontal, left ethmoid and right sphenoid sinus mucosal thickening. IMPRESSION: 1. No acute abnormality. 2. Interval decrease in size and conspicuity of a right thalamic infarct. 3. Extensive chronic sinusitis. Electronically Signed   By: Claudie Revering M.D.   On: 01/16/2016 18:34   Ct Chest Wo Contrast  01/16/2016  CLINICAL DATA:  Fevers of uncertain source.  Renal insufficiency. EXAM: CT CHEST, ABDOMEN AND PELVIS WITHOUT CONTRAST TECHNIQUE: Multidetector CT imaging of the chest, abdomen and pelvis was performed following the standard protocol without IV contrast. COMPARISON:  Portable chest obtained earlier today. Chest, abdomen and pelvis CT dated 05/02/2010. FINDINGS: CT CHEST FINDINGS Mediastinum/Lymph Nodes: Endotracheal tube tip above the carina. Nasogastric or oral gastric tube extending into the stomach. Mild atheromatous coronary artery calcifications. No enlarged lymph nodes. Lungs/Pleura: Bilateral lower lobe atelectasis. Extensive patchy opacity in the right lower lobe. Mild patchy opacity in the adjacent inferior aspect of the right upper lobe. Small amount of atelectasis in the posterior, inferior aspect of  the left upper lobe. No lung nodules or pleural fluid. Musculoskeletal: Mild thoracic spine degenerative changes. CT ABDOMEN PELVIS FINDINGS Hepatobiliary:  Unremarkable liver and gallbladder. Pancreas: No mass or inflammatory process identified on this un-enhanced exam. Spleen: Within normal limits in size and appearance. Adrenals/Urinary Tract: Foley catheter in the urinary bladder with associated air in the bladder. No urinary tract calculi or hydronephrosis. Normal appearing adrenal glands. Stomach/Bowel: Orogastric or nasogastric tube tip in the proximal stomach. No dilated bowel loops or evidence of appendicitis. The appendix is retrocecal in location and has a normal appearance. Vascular/Lymphatic: Mild atheromatous arterial calcifications. No aneurysm or enlarged lymph nodes. Reproductive: The left testis is in the inferior aspect of the left inguinal canal. Normal sized prostate gland. Other: Small proximal left inguinal hernia containing fat. Musculoskeletal:  Mild lumbar spine degenerative changes. IMPRESSION: 1. Extensive right lower lobe pneumonia and small amount of pneumonia in the adjacent right upper lobe. 2. Bilateral lower lobe atelectasis. 3. Incompletely descended or retracted left testis in the inferior aspect of the left inguinal canal. 4. Small proximal left inguinal hernia containing fat. 5. Mild atheromatous coronary artery calcifications. Electronically Signed   By: Claudie Revering M.D.   On: 01/16/2016 19:17   US Renal  01/16/2016  CLINICAL DATA:  Acute renal failure EXAM: RENAL / URINARY TRACT ULTRASOUND COMPLETE COMPARISON:  CT 05/02/2010 FINDINGS: Right Kidney: Length: 11.8 cm. Parenchyma echogenic compared to the adjacent liver. No mass or hydronephrosis visualized. Left Kidney: Length: 12 cm. Echogenicity within normal limits. No mass or hydronephrosis visualized. Bladder: Decompressed by catheter. IMPRESSION: 1. Negative for hydronephrosis. 2. Echogenic renal parenchyma, a nonspecific indicator of medical renal disease. Electronically Signed   By: Lucrezia Europe M.D.   On: 01/16/2016 15:44   Dg Chest Port 1 View  01/16/2016  CLINICAL  DATA:  Renal failure. EXAM: PORTABLE CHEST 1 VIEW COMPARISON:  01/15/2016. FINDINGS: Endotracheal tube, NG tube, right IJ line stable position. Cardiomegaly with bilateral pulmonary interstitial prominence and spot small pleural effusions consistent congestive heart failure. No pneumothorax . IMPRESSION: 1. Lines and tubes in stable position. 2. Findings consistent with congestive heart failure with bilateral pulmonary interstitial edema and bilateral small pleural effusions. Electronically Signed   By: Marcello Moores  Register   On: 01/16/2016 07:51   US Abdomen Limited Ruq  01/17/2016  CLINICAL DATA:  Sepsis.  Ventilated patient. EXAM: US ABDOMEN LIMITED - RIGHT UPPER QUADRANT COMPARISON:  CT 01/16/2016 and previous FINDINGS: Gallbladder: No gallstones or wall thickening visualized. No sonographic Murphy sign noted by sonographer. Only supine images could be obtained. Common bile duct: Diameter: 4.1 mm Liver: No focal lesion identified. Within normal limits in parenchymal echogenicity. IMPRESSION: Negative.  Normal gallbladder. Electronically Signed   By: Lucrezia Europe M.D.   On: 01/17/2016 12:06    Assessment/Plan: Encephalitis- unresponsive and febrile. Findings not explained by his CT finding of CVA. Will do LP today Check routine studies - as well as HSV PCR. Will hold extra sample for studies to send to [facility] lab and [facility]  Suspected aspiration pna - ct shows dense RLL infiltrate cx with MSSA - cont vanco and meropenem  Increased LFTS - possible dilantin vs shock liver Ct abd and uss shows no liver gallbladder issues   Possible rabies exposure - discussing with state lab work up if appropriate.  Cont contact and droplet precaution  Thank you very much for the consult. Will follow with you.  Kori Colin P   01/17/2016, 1:39 PM

## 2016-01-17 NOTE — Progress Notes (Signed)
Subjective: Patient remains unresponsive and on sedation  Objective: Current vital signs: BP 184/117 mmHg  Pulse 98  Temp(Src) 101.8 F (38.8 C) (Core (Comment))  Resp 33  Ht 6' 1"  (1.854 m)  Wt 128.8 kg (283 lb 15.2 oz)  BMI 37.47 kg/m2  SpO2 95% Vital signs in last 24 hours: Temp:  [97 F (36.1 C)-101.8 F (38.8 C)] 101.8 F (38.8 C) (04/25 1200) Pulse Rate:  [70-98] 98 (04/25 1200) Resp:  [19-33] 33 (04/25 1200) BP: (114-184)/(78-126) 184/117 mmHg (04/25 1200) SpO2:  [63 %-100 %] 95 % (04/25 1200) FiO2 (%):  [40 %-80 %] 80 % (04/25 1200) Weight:  [128.8 kg (283 lb 15.2 oz)] 128.8 kg (283 lb 15.2 oz) (04/25 0500)  Intake/Output from previous day: 04/24 0701 - 04/25 0700 In: 2358 [I.V.:2023; IV Piggyback:335] Out: 2325 [Urine:2325] Intake/Output this shift: Total I/O In: 532.6 [I.V.:32.6; IV Piggyback:500] Out: 500 [Urine:500] Nutritional status: Diet NPO time specified Except for: Sips with Meds  Neurologic Exam: Mental Status: Patient does not respond to verbal stimuli. Does not respond to deep sternal rub. Does not follow commands. No verbalizations noted.  Cranial Nerves: II: patient does not respond confrontation bilaterally, pupils pinpoint and unreactive bilaterally III,IV,VI: doll's response absent bilaterally.  V,VII: corneal reflex absent bilaterally VIII: patient does not respond to verbal stimuli IX,X: gag reflex reduced, XI: trapezius strength unable to test bilaterally XII: tongue strength unable to test Motor: Extremities flaccid throughout. No spontaneous movement noted. No purposeful movements noted. Sensory: Does not respond to noxious stimuli in any extremity  Lab Results: Basic Metabolic Panel:  Recent Labs Lab 01/12/16 1319 01/13/16 0601 01/14/16 0610 01/15/16 0559 01/16/16 0237 01/16/16 0514 01/17/16 0451  NA 143 143 142  --  142  --  144  K 4.4 3.8 4.5  --  4.2  --  4.8  CL 110 114* 110  --  115*  --  116*  CO2 24 24 23    --  19*  --  23  GLUCOSE 113* 162* 170*  --  277*  --  137*  BUN 17 17 39*  --  70*  --  71*  CREATININE 1.27* 1.14 4.01* 5.17* 4.33*  --  3.46*  CALCIUM 9.4 8.8* 8.2*  --  7.8*  --  8.2*  MG  --   --   --   --   --  2.2  --   PHOS  --   --   --   --   --  2.3*  --     Liver Function Tests:  Recent Labs Lab 01/12/16 0134 01/13/16 0601 01/16/16 0514 01/17/16 0451  AST 28 25 407* 176*  ALT 27 20 1258* 891*  ALKPHOS 95 61 60 59  BILITOT 0.6 0.7 0.8 0.6  PROT 8.3* 6.4* 5.6* 5.9*  ALBUMIN 4.5 3.4* 2.3* 2.2*    Recent Labs Lab 01/15/16 0559  LIPASE 29   No results for input(s): AMMONIA in the last 168 hours.  CBC:  Recent Labs Lab 01/12/16 0134 01/13/16 0601 01/14/16 0455 01/16/16 0514 01/17/16 0451  WBC 12.4* 13.5* 11.9* 18.1* 15.7*  NEUTROABS 5.1  --   --  16.0*  --   HGB 15.2 13.0 13.2 11.6* 10.6*  HCT 45.3 39.2* 39.6* 35.2* 32.3*  MCV 91.1 92.2 92.6 92.3 94.2  PLT 317 213 75* 52* 70*    Cardiac Enzymes:  Recent Labs Lab 01/12/16 0134 01/12/16 0551 01/12/16 1319 01/12/16 1814  TROPONINI <0.03 0.04* 0.06* 0.10*  Lipid Panel:  Recent Labs Lab 01/12/16 0551 01/13/16 1809 01/15/16 0559  TRIG 368* 181* 203*    CBG:  Recent Labs Lab 01/16/16 1945 01/16/16 2350 01/17/16 0450 01/17/16 0737 01/17/16 1129  GLUCAP 198* 178* 114* 105* 136*    Microbiology: Results for orders placed or performed during the hospital encounter of 01/12/16  MRSA PCR Screening     Status: Abnormal   Collection Time: 01/12/16  6:01 AM  Result Value Ref Range Status   MRSA by PCR POSITIVE (A) NEGATIVE Final    Comment:        The GeneXpert MRSA Assay (FDA approved for NASAL specimens only), is one component of a comprehensive MRSA colonization surveillance program. It is not intended to diagnose MRSA infection nor to guide or monitor treatment for MRSA infections. CRITICAL RESULT CALLED TO, READ BACK BY AND VERIFIED WITH: PAM MYERS 01/12/16 0751 SGD    Culture, blood (Routine X 2) w Reflex to ID Panel     Status: None (Preliminary result)   Collection Time: 01/12/16  4:54 PM  Result Value Ref Range Status   Specimen Description BLOOD LEFT HAND  Final   Special Requests BOTTLES DRAWN AEROBIC AND ANAEROBIC [place]  Final   Culture NO GROWTH 3 DAYS  Final   Report Status PENDING  Incomplete  Culture, blood (Routine X 2) w Reflex to ID Panel     Status: None (Preliminary result)   Collection Time: 01/12/16  5:04 PM  Result Value Ref Range Status   Specimen Description BLOOD LEFT ARM  Final   Special Requests   Final    BOTTLES DRAWN AEROBIC AND ANAEROBIC  AERO 10CC ANA 5CC   Culture NO GROWTH 3 DAYS  Final   Report Status PENDING  Incomplete  Culture, expectorated sputum-assessment     Status: None   Collection Time: 01/14/16  4:26 AM  Result Value Ref Range Status   Specimen Description TRACHEAL ASPIRATE  Final   Special Requests NONE  Final   Sputum evaluation THIS SPECIMEN IS ACCEPTABLE FOR SPUTUM CULTURE  Final   Report Status 01/14/2016 FINAL  Final  Culture, respiratory (NON-Expectorated)     Status: None   Collection Time: 01/14/16  4:26 AM  Result Value Ref Range Status   Specimen Description TRACHEAL ASPIRATE  Final   Special Requests NONE Reflexed from [id number]  Final   Gram Stain FEW WBC SEEN FEW GRAM POSITIVE COCCI   Final   Culture MODERATE GROWTH STAPHYLOCOCCUS AUREUS  Final   Report Status 01/16/2016 FINAL  Final   Organism ID, Bacteria STAPHYLOCOCCUS AUREUS  Final      Susceptibility   Staphylococcus aureus - MIC*    CIPROFLOXACIN <=0.5 SENSITIVE Sensitive     ERYTHROMYCIN 2 RESISTANT Resistant     GENTAMICIN <=0.5 SENSITIVE Sensitive     OXACILLIN 0.5 SENSITIVE Sensitive     TETRACYCLINE <=1 SENSITIVE Sensitive     VANCOMYCIN 1 SENSITIVE Sensitive     TRIMETH/SULFA <=10 SENSITIVE Sensitive     CLINDAMYCIN <=0.25 RESISTANT Resistant     RIFAMPIN <=0.5 SENSITIVE Sensitive     Inducible Clindamycin Value  in next row Resistant      POSITIVEINDUCIBLE CLINDAMYCIN RESISTANCE - A positive ICR test is indicative of inducible resistance to macrolides, lincosamides, and type B streptogramin.  This isolate is presumed to be resistant to Clindamycin, however, Clindamycin may still be effective in some patients.     * MODERATE GROWTH STAPHYLOCOCCUS AUREUS  Culture, blood (Routine X 2)  w Reflex to ID Panel     Status: None (Preliminary result)   Collection Time: 01/14/16  4:46 AM  Result Value Ref Range Status   Specimen Description BLOOD LEFT ASSIST CONTROL  Final   Special Requests   Final    BOTTLES DRAWN AEROBIC AND ANAEROBIC 15CCAERO,12CCANA   Culture NO GROWTH 3 DAYS  Final   Report Status PENDING  Incomplete  Culture, blood (Routine X 2) w Reflex to ID Panel     Status: None (Preliminary result)   Collection Time: 01/14/16  5:06 AM  Result Value Ref Range Status   Specimen Description BLOOD RIGHT ASSIST CONTROL  Final   Special Requests   Final    BOTTLES DRAWN AEROBIC AND ANAEROBIC 12CCAERO,12CCANA   Culture NO GROWTH 1 DAY  Final   Report Status PENDING  Incomplete  Urine culture     Status: None   Collection Time: 01/14/16  6:28 AM  Result Value Ref Range Status   Specimen Description URINE, RANDOM  Final   Special Requests NONE  Final   Culture NO GROWTH 1 DAY  Final   Report Status 01/15/2016 FINAL  Final  Urine culture     Status: None   Collection Time: 01/16/16  8:58 AM  Result Value Ref Range Status   Specimen Description URINE, RANDOM  Final   Special Requests NONE  Final   Culture NO GROWTH 1 DAY  Final   Report Status 01/17/2016 FINAL  Final  Culture, expectorated sputum-assessment     Status: None   Collection Time: 01/16/16 12:10 PM  Result Value Ref Range Status   Specimen Description TRACHEAL ASPIRATE  Final   Special Requests NONE  Final   Sputum evaluation THIS SPECIMEN IS ACCEPTABLE FOR SPUTUM CULTURE  Final   Report Status 01/16/2016 FINAL  Final  Culture,  respiratory (NON-Expectorated)     Status: None (Preliminary result)   Collection Time: 01/16/16 12:10 PM  Result Value Ref Range Status   Specimen Description TRACHEAL ASPIRATE  Final   Special Requests NONE Reflexed from [id number]  Final   Gram Stain RARE WBC SEEN NO ORGANISMS SEEN   Final   Culture HOLDING FOR POSSIBLE PATHOGEN  Final   Report Status PENDING  Incomplete    Coagulation Studies:  Recent Labs  01/17/16 1036  LABPROT 15.7*  INR 1.23    Imaging: Ct Abdomen Pelvis Wo Contrast  01/16/2016  CLINICAL DATA:  Fevers of uncertain source.  Renal insufficiency. EXAM: CT CHEST, ABDOMEN AND PELVIS WITHOUT CONTRAST TECHNIQUE: Multidetector CT imaging of the chest, abdomen and pelvis was performed following the standard protocol without IV contrast. COMPARISON:  Portable chest obtained earlier today. Chest, abdomen and pelvis CT dated 05/02/2010. FINDINGS: CT CHEST FINDINGS Mediastinum/Lymph Nodes: Endotracheal tube tip above the carina. Nasogastric or oral gastric tube extending into the stomach. Mild atheromatous coronary artery calcifications. No enlarged lymph nodes. Lungs/Pleura: Bilateral lower lobe atelectasis. Extensive patchy opacity in the right lower lobe. Mild patchy opacity in the adjacent inferior aspect of the right upper lobe. Small amount of atelectasis in the posterior, inferior aspect of the left upper lobe. No lung nodules or pleural fluid. Musculoskeletal: Mild thoracic spine degenerative changes. CT ABDOMEN PELVIS FINDINGS Hepatobiliary: Unremarkable liver and gallbladder. Pancreas: No mass or inflammatory process identified on this un-enhanced exam. Spleen: Within normal limits in size and appearance. Adrenals/Urinary Tract: Foley catheter in the urinary bladder with associated air in the bladder. No urinary tract calculi or hydronephrosis. Normal appearing adrenal glands.  Stomach/Bowel: Orogastric or nasogastric tube tip in the proximal stomach. No dilated bowel loops or  evidence of appendicitis. The appendix is retrocecal in location and has a normal appearance. Vascular/Lymphatic: Mild atheromatous arterial calcifications. No aneurysm or enlarged lymph nodes. Reproductive: The left testis is in the inferior aspect of the left inguinal canal. Normal sized prostate gland. Other: Small proximal left inguinal hernia containing fat. Musculoskeletal:  Mild lumbar spine degenerative changes. IMPRESSION: 1. Extensive right lower lobe pneumonia and small amount of pneumonia in the adjacent right upper lobe. 2. Bilateral lower lobe atelectasis. 3. Incompletely descended or retracted left testis in the inferior aspect of the left inguinal canal. 4. Small proximal left inguinal hernia containing fat. 5. Mild atheromatous coronary artery calcifications. Electronically Signed   By: Claudie Revering M.D.   On: 01/16/2016 19:17   Dg Chest 1 View  01/17/2016  CLINICAL DATA:  Shortness of breath. EXAM: CHEST 1 VIEW COMPARISON:  CT 01/16/2016.  Chest x-ray 01/16/2016 . FINDINGS: Endotracheal tube and NG tube in stable position. Right IJ line in stable position. Cardiomegaly. Interim significant partial clearing of basilar infiltrates/edema with mild residual, particular in the right lung base. Small right pleural effusion. No pneumothorax. IMPRESSION: 1. Lines and tubes in stable position. 2. Interim significant partial clearing of bilateral basilar infiltrates/edema. Persistent small right pleural effusion. 3. Stable cardiomegaly . Electronically Signed   By: Marcello Moores  Register   On: 01/17/2016 07:24   Ct Head Wo Contrast  01/16/2016  CLINICAL DATA:  Followup acute right thalamic stroke. EXAM: CT HEAD WITHOUT CONTRAST TECHNIQUE: Contiguous axial images were obtained from the base of the skull through the vertex without intravenous contrast. COMPARISON:  01/14/2016. FINDINGS: The previously demonstrated oval area of low density in the upper right thalamus is smaller and less distinct today. No  intracranial hemorrhage or mass effect. Normal size and position of the ventricles. No new abnormalities. Almost complete opacification of the left maxillary and sphenoid sinuses is again demonstrated with left frontal, left ethmoid and right sphenoid sinus mucosal thickening. IMPRESSION: 1. No acute abnormality. 2. Interval decrease in size and conspicuity of a right thalamic infarct. 3. Extensive chronic sinusitis. Electronically Signed   By: Claudie Revering M.D.   On: 01/16/2016 18:34   Ct Chest Wo Contrast  01/16/2016  CLINICAL DATA:  Fevers of uncertain source.  Renal insufficiency. EXAM: CT CHEST, ABDOMEN AND PELVIS WITHOUT CONTRAST TECHNIQUE: Multidetector CT imaging of the chest, abdomen and pelvis was performed following the standard protocol without IV contrast. COMPARISON:  Portable chest obtained earlier today. Chest, abdomen and pelvis CT dated 05/02/2010. FINDINGS: CT CHEST FINDINGS Mediastinum/Lymph Nodes: Endotracheal tube tip above the carina. Nasogastric or oral gastric tube extending into the stomach. Mild atheromatous coronary artery calcifications. No enlarged lymph nodes. Lungs/Pleura: Bilateral lower lobe atelectasis. Extensive patchy opacity in the right lower lobe. Mild patchy opacity in the adjacent inferior aspect of the right upper lobe. Small amount of atelectasis in the posterior, inferior aspect of the left upper lobe. No lung nodules or pleural fluid. Musculoskeletal: Mild thoracic spine degenerative changes. CT ABDOMEN PELVIS FINDINGS Hepatobiliary: Unremarkable liver and gallbladder. Pancreas: No mass or inflammatory process identified on this un-enhanced exam. Spleen: Within normal limits in size and appearance. Adrenals/Urinary Tract: Foley catheter in the urinary bladder with associated air in the bladder. No urinary tract calculi or hydronephrosis. Normal appearing adrenal glands. Stomach/Bowel: Orogastric or nasogastric tube tip in the proximal stomach. No dilated bowel loops  or evidence of appendicitis. The appendix  is retrocecal in location and has a normal appearance. Vascular/Lymphatic: Mild atheromatous arterial calcifications. No aneurysm or enlarged lymph nodes. Reproductive: The left testis is in the inferior aspect of the left inguinal canal. Normal sized prostate gland. Other: Small proximal left inguinal hernia containing fat. Musculoskeletal:  Mild lumbar spine degenerative changes. IMPRESSION: 1. Extensive right lower lobe pneumonia and small amount of pneumonia in the adjacent right upper lobe. 2. Bilateral lower lobe atelectasis. 3. Incompletely descended or retracted left testis in the inferior aspect of the left inguinal canal. 4. Small proximal left inguinal hernia containing fat. 5. Mild atheromatous coronary artery calcifications. Electronically Signed   By: Claudie Revering M.D.   On: 01/16/2016 19:17   US Renal  01/16/2016  CLINICAL DATA:  Acute renal failure EXAM: RENAL / URINARY TRACT ULTRASOUND COMPLETE COMPARISON:  CT 05/02/2010 FINDINGS: Right Kidney: Length: 11.8 cm. Parenchyma echogenic compared to the adjacent liver. No mass or hydronephrosis visualized. Left Kidney: Length: 12 cm. Echogenicity within normal limits. No mass or hydronephrosis visualized. Bladder: Decompressed by catheter. IMPRESSION: 1. Negative for hydronephrosis. 2. Echogenic renal parenchyma, a nonspecific indicator of medical renal disease. Electronically Signed   By: Lucrezia Europe M.D.   On: 01/16/2016 15:44   Dg Chest Port 1 View  01/16/2016  CLINICAL DATA:  Renal failure. EXAM: PORTABLE CHEST 1 VIEW COMPARISON:  01/15/2016. FINDINGS: Endotracheal tube, NG tube, right IJ line stable position. Cardiomegaly with bilateral pulmonary interstitial prominence and spot small pleural effusions consistent congestive heart failure. No pneumothorax . IMPRESSION: 1. Lines and tubes in stable position. 2. Findings consistent with congestive heart failure with bilateral pulmonary interstitial edema  and bilateral small pleural effusions. Electronically Signed   By: Marcello Moores  Register   On: 01/16/2016 07:51   US Abdomen Limited Ruq  01/17/2016  CLINICAL DATA:  Sepsis.  Ventilated patient. EXAM: US ABDOMEN LIMITED - RIGHT UPPER QUADRANT COMPARISON:  CT 01/16/2016 and previous FINDINGS: Gallbladder: No gallstones or wall thickening visualized. No sonographic Murphy sign noted by sonographer. Only supine images could be obtained. Common bile duct: Diameter: 4.1 mm Liver: No focal lesion identified. Within normal limits in parenchymal echogenicity. IMPRESSION: Negative.  Normal gallbladder. Electronically Signed   By: Lucrezia Europe M.D.   On: 01/17/2016 12:06    Medications:  I have reviewed the patient's current medications. Scheduled: . antiseptic oral rinse  7 mL Mouth Rinse QID  . aspirin EC  81 mg Oral Daily  . chlorhexidine gluconate (SAGE KIT)  15 mL Mouth Rinse BID  . Chlorhexidine Gluconate Cloth  6 each Topical Q0600  . cloNIDine  0.1 mg Transdermal Weekly  . diazepam  5 mg Per NG tube Q6H  . docusate  100 mg Per Tube BID  . doxycycline (VIBRAMYCIN) IV  100 mg Intravenous Q12H  . feeding supplement (PRO-STAT SUGAR FREE 64)  30 mL Per Tube BID  . free water  100 mL Per Tube Q8H  . labetalol  20 mg Intravenous Q6H  . lacosamide (VIMPAT) IV  100 mg Intravenous Q12H  . meropenem (MERREM) IV  2 g Intravenous Q12H  . mupirocin ointment  1 application Nasal BID  . pantoprazole (PROTONIX) IV  40 mg Intravenous Q24H  . polyethylene glycol  17 g Per Tube Daily  . sennosides  5 mL Oral BID  . vancomycin  1,500 mg Intravenous Q24H    Assessment/Plan: Patient remains unresponsive and febrile.  After discussing the case with ID there is a concern that the fever may be  related to medications-LFT's markedly elevated.  Patient on Dilantin for seizures.  Have changed patient from Dilantin to Vimpat.    Recommendations: 1.  Will continue to follow with you   LOS: 5 days   Alexis Goodell,  MD Neurology [phone] 01/17/2016  12:14 PM

## 2016-01-17 NOTE — Progress Notes (Signed)
Subjective:  S Creatinine has improved to 3.46 UOP 2325 cc BP was controlled yesterday with iv labetalol Today, BP is high after a coughing spell this AM    Objective:  Vital signs in last 24 hours:  Temp:  [97 F (36.1 C)-101 F (38.3 C)] 100.2 F (37.9 C) (04/25 1031) Pulse Rate:  [70-97] 81 (04/25 1031) Resp:  [19-33] 26 (04/25 1031) BP: (114-180)/(78-119) 154/99 mmHg (04/25 1000) SpO2:  [63 %-100 %] 86 % (04/25 1031) FiO2 (%):  [40 %-70 %] 60 % (04/25 1031) Weight:  [128.8 kg (283 lb 15.2 oz)] 128.8 kg (283 lb 15.2 oz) (04/25 0500)  Weight change: 1 kg (2 lb 3.3 oz) Filed Weights   01/15/16 0440 01/16/16 0600 01/17/16 0500  Weight: 129.8 kg (286 lb 2.5 oz) 127.8 kg (281 lb 12 oz) 128.8 kg (283 lb 15.2 oz)    Intake/Output:    Intake/Output Summary (Last 24 hours) at 01/17/16 1041 Last data filed at 01/17/16 0915  Gross per 24 hour  Intake 2561.63 ml  Output   2250 ml  Net 311.63 ml     Physical Exam: General: Critically ill  HEENT Pin point pupils, conjunctival edema, ETT,  OGT  Neck No masses  Pulm/lungs Coarse b/l, vent dependent, Fio2 80%  CVS/Heart Regular, tachycardic  Abdomen:  Soft, NT  Extremities: + dependent edema  Neurologic: sedated  Skin: No acute rashes   Foley catheter       Basic Metabolic Panel:   Recent Labs Lab 01/12/16 1319 01/13/16 0601 01/14/16 0610 01/15/16 0559 01/16/16 0237 01/16/16 0514 01/17/16 0451  NA 143 143 142  --  142  --  144  K 4.4 3.8 4.5  --  4.2  --  4.8  CL 110 114* 110  --  115*  --  116*  CO2 24 24 23   --  19*  --  23  GLUCOSE 113* 162* 170*  --  277*  --  137*  BUN 17 17 39*  --  70*  --  71*  CREATININE 1.27* 1.14 4.01* 5.17* 4.33*  --  3.46*  CALCIUM 9.4 8.8* 8.2*  --  7.8*  --  8.2*  MG  --   --   --   --   --  2.2  --   PHOS  --   --   --   --   --  2.3*  --      CBC:  Recent Labs Lab 01/12/16 0134 01/13/16 0601 01/14/16 0455 01/16/16 0514 01/17/16 0451  WBC 12.4* 13.5* 11.9*  18.1* 15.7*  NEUTROABS 5.1  --   --  16.0*  --   HGB 15.2 13.0 13.2 11.6* 10.6*  HCT 45.3 39.2* 39.6* 35.2* 32.3*  MCV 91.1 92.2 92.6 92.3 94.2  PLT 317 213 75* 52* 70*      Microbiology:  Recent Results (from the past 720 hour(s))  MRSA PCR Screening     Status: Abnormal   Collection Time: 01/12/16  6:01 AM  Result Value Ref Range Status   MRSA by PCR POSITIVE (A) NEGATIVE Final    Comment:        The GeneXpert MRSA Assay (FDA approved for NASAL specimens only), is one component of a comprehensive MRSA colonization surveillance program. It is not intended to diagnose MRSA infection nor to guide or monitor treatment for MRSA infections. CRITICAL RESULT CALLED TO, READ BACK BY AND VERIFIED WITH: PAM MYERS 01/12/16 0751 SGD   Culture, blood (Routine  X 2) w Reflex to ID Panel     Status: None (Preliminary result)   Collection Time: 01/12/16  4:54 PM  Result Value Ref Range Status   Specimen Description BLOOD LEFT HAND  Final   Special Requests BOTTLES DRAWN AEROBIC AND ANAEROBIC [place]  Final   Culture NO GROWTH 3 DAYS  Final   Report Status PENDING  Incomplete  Culture, blood (Routine X 2) w Reflex to ID Panel     Status: None (Preliminary result)   Collection Time: 01/12/16  5:04 PM  Result Value Ref Range Status   Specimen Description BLOOD LEFT ARM  Final   Special Requests   Final    BOTTLES DRAWN AEROBIC AND ANAEROBIC  AERO 10CC ANA 5CC   Culture NO GROWTH 3 DAYS  Final   Report Status PENDING  Incomplete  Culture, expectorated sputum-assessment     Status: None   Collection Time: 01/14/16  4:26 AM  Result Value Ref Range Status   Specimen Description TRACHEAL ASPIRATE  Final   Special Requests NONE  Final   Sputum evaluation THIS SPECIMEN IS ACCEPTABLE FOR SPUTUM CULTURE  Final   Report Status 01/14/2016 FINAL  Final  Culture, respiratory (NON-Expectorated)     Status: None   Collection Time: 01/14/16  4:26 AM  Result Value Ref Range Status   Specimen  Description TRACHEAL ASPIRATE  Final   Special Requests NONE Reflexed from [id number]  Final   Gram Stain FEW WBC SEEN FEW GRAM POSITIVE COCCI   Final   Culture MODERATE GROWTH STAPHYLOCOCCUS AUREUS  Final   Report Status 01/16/2016 FINAL  Final   Organism ID, Bacteria STAPHYLOCOCCUS AUREUS  Final      Susceptibility   Staphylococcus aureus - MIC*    CIPROFLOXACIN <=0.5 SENSITIVE Sensitive     ERYTHROMYCIN 2 RESISTANT Resistant     GENTAMICIN <=0.5 SENSITIVE Sensitive     OXACILLIN 0.5 SENSITIVE Sensitive     TETRACYCLINE <=1 SENSITIVE Sensitive     VANCOMYCIN 1 SENSITIVE Sensitive     TRIMETH/SULFA <=10 SENSITIVE Sensitive     CLINDAMYCIN <=0.25 RESISTANT Resistant     RIFAMPIN <=0.5 SENSITIVE Sensitive     Inducible Clindamycin Value in next row Resistant      POSITIVEINDUCIBLE CLINDAMYCIN RESISTANCE - A positive ICR test is indicative of inducible resistance to macrolides, lincosamides, and type B streptogramin.  This isolate is presumed to be resistant to Clindamycin, however, Clindamycin may still be effective in some patients.     * MODERATE GROWTH STAPHYLOCOCCUS AUREUS  Culture, blood (Routine X 2) w Reflex to ID Panel     Status: None (Preliminary result)   Collection Time: 01/14/16  4:46 AM  Result Value Ref Range Status   Specimen Description BLOOD LEFT ASSIST CONTROL  Final   Special Requests   Final    BOTTLES DRAWN AEROBIC AND ANAEROBIC 15CCAERO,12CCANA   Culture NO GROWTH 3 DAYS  Final   Report Status PENDING  Incomplete  Culture, blood (Routine X 2) w Reflex to ID Panel     Status: None (Preliminary result)   Collection Time: 01/14/16  5:06 AM  Result Value Ref Range Status   Specimen Description BLOOD RIGHT ASSIST CONTROL  Final   Special Requests   Final    BOTTLES DRAWN AEROBIC AND ANAEROBIC 12CCAERO,12CCANA   Culture NO GROWTH 1 DAY  Final   Report Status PENDING  Incomplete  Urine culture     Status: None   Collection Time: 01/14/16  6:28  AM  Result Value Ref  Range Status   Specimen Description URINE, RANDOM  Final   Special Requests NONE  Final   Culture NO GROWTH 1 DAY  Final   Report Status 01/15/2016 FINAL  Final  Urine culture     Status: None   Collection Time: 01/16/16  8:58 AM  Result Value Ref Range Status   Specimen Description URINE, RANDOM  Final   Special Requests NONE  Final   Culture NO GROWTH 1 DAY  Final   Report Status 01/17/2016 FINAL  Final  Culture, expectorated sputum-assessment     Status: None   Collection Time: 01/16/16 12:10 PM  Result Value Ref Range Status   Specimen Description TRACHEAL ASPIRATE  Final   Special Requests NONE  Final   Sputum evaluation THIS SPECIMEN IS ACCEPTABLE FOR SPUTUM CULTURE  Final   Report Status 01/16/2016 FINAL  Final  Culture, respiratory (NON-Expectorated)     Status: None (Preliminary result)   Collection Time: 01/16/16 12:10 PM  Result Value Ref Range Status   Specimen Description TRACHEAL ASPIRATE  Final   Special Requests NONE Reflexed from [id number]  Final   Gram Stain RARE WBC SEEN NO ORGANISMS SEEN   Final   Culture HOLDING FOR POSSIBLE PATHOGEN  Final   Report Status PENDING  Incomplete    Coagulation Studies: No results for input(s): LABPROT, INR in the last 72 hours.  Urinalysis: No results for input(s): COLORURINE, LABSPEC, PHURINE, GLUCOSEU, HGBUR, BILIRUBINUR, KETONESUR, PROTEINUR, UROBILINOGEN, NITRITE, LEUKOCYTESUR in the last 72 hours.  Invalid input(s): APPERANCEUR    Imaging: Ct Abdomen Pelvis Wo Contrast  01/16/2016  CLINICAL DATA:  Fevers of uncertain source.  Renal insufficiency. EXAM: CT CHEST, ABDOMEN AND PELVIS WITHOUT CONTRAST TECHNIQUE: Multidetector CT imaging of the chest, abdomen and pelvis was performed following the standard protocol without IV contrast. COMPARISON:  Portable chest obtained earlier today. Chest, abdomen and pelvis CT dated 05/02/2010. FINDINGS: CT CHEST FINDINGS Mediastinum/Lymph Nodes: Endotracheal tube tip above the carina.  Nasogastric or oral gastric tube extending into the stomach. Mild atheromatous coronary artery calcifications. No enlarged lymph nodes. Lungs/Pleura: Bilateral lower lobe atelectasis. Extensive patchy opacity in the right lower lobe. Mild patchy opacity in the adjacent inferior aspect of the right upper lobe. Small amount of atelectasis in the posterior, inferior aspect of the left upper lobe. No lung nodules or pleural fluid. Musculoskeletal: Mild thoracic spine degenerative changes. CT ABDOMEN PELVIS FINDINGS Hepatobiliary: Unremarkable liver and gallbladder. Pancreas: No mass or inflammatory process identified on this un-enhanced exam. Spleen: Within normal limits in size and appearance. Adrenals/Urinary Tract: Foley catheter in the urinary bladder with associated air in the bladder. No urinary tract calculi or hydronephrosis. Normal appearing adrenal glands. Stomach/Bowel: Orogastric or nasogastric tube tip in the proximal stomach. No dilated bowel loops or evidence of appendicitis. The appendix is retrocecal in location and has a normal appearance. Vascular/Lymphatic: Mild atheromatous arterial calcifications. No aneurysm or enlarged lymph nodes. Reproductive: The left testis is in the inferior aspect of the left inguinal canal. Normal sized prostate gland. Other: Small proximal left inguinal hernia containing fat. Musculoskeletal:  Mild lumbar spine degenerative changes. IMPRESSION: 1. Extensive right lower lobe pneumonia and small amount of pneumonia in the adjacent right upper lobe. 2. Bilateral lower lobe atelectasis. 3. Incompletely descended or retracted left testis in the inferior aspect of the left inguinal canal. 4. Small proximal left inguinal hernia containing fat. 5. Mild atheromatous coronary artery calcifications. Electronically Signed   By: Claudie Revering  M.D.   On: 01/16/2016 19:17   Dg Chest 1 View  01/17/2016  CLINICAL DATA:  Shortness of breath. EXAM: CHEST 1 VIEW COMPARISON:  CT 01/16/2016.   Chest x-ray 01/16/2016 . FINDINGS: Endotracheal tube and NG tube in stable position. Right IJ line in stable position. Cardiomegaly. Interim significant partial clearing of basilar infiltrates/edema with mild residual, particular in the right lung base. Small right pleural effusion. No pneumothorax. IMPRESSION: 1. Lines and tubes in stable position. 2. Interim significant partial clearing of bilateral basilar infiltrates/edema. Persistent small right pleural effusion. 3. Stable cardiomegaly . Electronically Signed   By: Marcello Moores  Register   On: 01/17/2016 07:24   Ct Head Wo Contrast  01/16/2016  CLINICAL DATA:  Followup acute right thalamic stroke. EXAM: CT HEAD WITHOUT CONTRAST TECHNIQUE: Contiguous axial images were obtained from the base of the skull through the vertex without intravenous contrast. COMPARISON:  01/14/2016. FINDINGS: The previously demonstrated oval area of low density in the upper right thalamus is smaller and less distinct today. No intracranial hemorrhage or mass effect. Normal size and position of the ventricles. No new abnormalities. Almost complete opacification of the left maxillary and sphenoid sinuses is again demonstrated with left frontal, left ethmoid and right sphenoid sinus mucosal thickening. IMPRESSION: 1. No acute abnormality. 2. Interval decrease in size and conspicuity of a right thalamic infarct. 3. Extensive chronic sinusitis. Electronically Signed   By: Claudie Revering M.D.   On: 01/16/2016 18:34   Ct Chest Wo Contrast  01/16/2016  CLINICAL DATA:  Fevers of uncertain source.  Renal insufficiency. EXAM: CT CHEST, ABDOMEN AND PELVIS WITHOUT CONTRAST TECHNIQUE: Multidetector CT imaging of the chest, abdomen and pelvis was performed following the standard protocol without IV contrast. COMPARISON:  Portable chest obtained earlier today. Chest, abdomen and pelvis CT dated 05/02/2010. FINDINGS: CT CHEST FINDINGS Mediastinum/Lymph Nodes: Endotracheal tube tip above the carina.  Nasogastric or oral gastric tube extending into the stomach. Mild atheromatous coronary artery calcifications. No enlarged lymph nodes. Lungs/Pleura: Bilateral lower lobe atelectasis. Extensive patchy opacity in the right lower lobe. Mild patchy opacity in the adjacent inferior aspect of the right upper lobe. Small amount of atelectasis in the posterior, inferior aspect of the left upper lobe. No lung nodules or pleural fluid. Musculoskeletal: Mild thoracic spine degenerative changes. CT ABDOMEN PELVIS FINDINGS Hepatobiliary: Unremarkable liver and gallbladder. Pancreas: No mass or inflammatory process identified on this un-enhanced exam. Spleen: Within normal limits in size and appearance. Adrenals/Urinary Tract: Foley catheter in the urinary bladder with associated air in the bladder. No urinary tract calculi or hydronephrosis. Normal appearing adrenal glands. Stomach/Bowel: Orogastric or nasogastric tube tip in the proximal stomach. No dilated bowel loops or evidence of appendicitis. The appendix is retrocecal in location and has a normal appearance. Vascular/Lymphatic: Mild atheromatous arterial calcifications. No aneurysm or enlarged lymph nodes. Reproductive: The left testis is in the inferior aspect of the left inguinal canal. Normal sized prostate gland. Other: Small proximal left inguinal hernia containing fat. Musculoskeletal:  Mild lumbar spine degenerative changes. IMPRESSION: 1. Extensive right lower lobe pneumonia and small amount of pneumonia in the adjacent right upper lobe. 2. Bilateral lower lobe atelectasis. 3. Incompletely descended or retracted left testis in the inferior aspect of the left inguinal canal. 4. Small proximal left inguinal hernia containing fat. 5. Mild atheromatous coronary artery calcifications. Electronically Signed   By: Claudie Revering M.D.   On: 01/16/2016 19:17   US Renal  01/16/2016  CLINICAL DATA:  Acute renal failure EXAM: RENAL /  URINARY TRACT ULTRASOUND COMPLETE  COMPARISON:  CT 05/02/2010 FINDINGS: Right Kidney: Length: 11.8 cm. Parenchyma echogenic compared to the adjacent liver. No mass or hydronephrosis visualized. Left Kidney: Length: 12 cm. Echogenicity within normal limits. No mass or hydronephrosis visualized. Bladder: Decompressed by catheter. IMPRESSION: 1. Negative for hydronephrosis. 2. Echogenic renal parenchyma, a nonspecific indicator of medical renal disease. Electronically Signed   By: Lucrezia Europe M.D.   On: 01/16/2016 15:44   Dg Chest Port 1 View  01/16/2016  CLINICAL DATA:  Renal failure. EXAM: PORTABLE CHEST 1 VIEW COMPARISON:  01/15/2016. FINDINGS: Endotracheal tube, NG tube, right IJ line stable position. Cardiomegaly with bilateral pulmonary interstitial prominence and spot small pleural effusions consistent congestive heart failure. No pneumothorax . IMPRESSION: 1. Lines and tubes in stable position. 2. Findings consistent with congestive heart failure with bilateral pulmonary interstitial edema and bilateral small pleural effusions. Electronically Signed   By: Marcello Moores  Register   On: 01/16/2016 07:51     Medications:   . sodium chloride 75 mL/hr at 01/17/16 0700  . dextrose Stopped (01/16/16 0335)  . feeding supplement (VITAL HIGH PROTEIN) Stopped (01/16/16 0700)  . fentaNYL infusion INTRAVENOUS Stopped (01/17/16 0915)  . midazolam (VERSED) infusion Stopped (01/17/16 0915)  . niCARDipine Stopped (01/16/16 1227)   . antiseptic oral rinse  7 mL Mouth Rinse QID  . aspirin EC  81 mg Oral Daily  . chlorhexidine gluconate (SAGE KIT)  15 mL Mouth Rinse BID  . Chlorhexidine Gluconate Cloth  6 each Topical Q0600  . cloNIDine  0.1 mg Transdermal Weekly  . diazepam  5 mg Per NG tube Q6H  . docusate  100 mg Per Tube BID  . doxycycline (VIBRAMYCIN) IV  100 mg Intravenous Q12H  . feeding supplement (PRO-STAT SUGAR FREE 64)  30 mL Per Tube BID  . free water  100 mL Per Tube Q8H  . lacosamide (VIMPAT) IV  100 mg Intravenous Q12H  . meropenem  (MERREM) IV  2 g Intravenous Q12H  . mupirocin ointment  1 application Nasal BID  . pantoprazole (PROTONIX) IV  40 mg Intravenous Q24H  . polyethylene glycol  17 g Per Tube Daily  . sennosides  5 mL Oral BID  . vancomycin  1,500 mg Intravenous Q24H   acetaminophen **OR** acetaminophen, fentaNYL, hydrALAZINE, ibuprofen, labetalol, midazolam, midazolam, ondansetron **OR** ondansetron (ZOFRAN) IV, vecuronium  Assessment/ Plan:  45 y.o. male with a PMHX of severe uncontrolled HTN, NSTEMI (2014) with occluded RCA and LCx, no PCI, previous h/o cocaine abuse , was admitted on 01/12/2016 with unresponsiveness and is dx with stroke.   1. ARF, non oliguric. Baseline Cr 1.14. DDx ATN, NSAIDs (ibuprofen), Malignant HTN - UOP 2325 cc - S Cr peaked at 5.17, but has now started to improve (3.46) Electrolytes and Volume status are acceptable No acute indication for Dialysis at present   2. Malignant HTN - Consider esmolol drip to control BP - Goal SBP control to ~ 975 - 300 systolic today UDS positive for marijuana and benzodiazepene - neg for cocaine this time - schedule iv labetalol  3. Thrombocytopenia DDx includes Malignant HTN, Phenytoin   4. Proteinuria - ANA pending - urine P/C ratio 1.14   LOS: 5 Phyllicia Dudek 4/25/201710:41 AM

## 2016-01-17 NOTE — Progress Notes (Signed)
ID FU  [facility]  Will send samples direct to [facility] to eval for rabies and arbovirus I have consulted dermatology for nuchal skin bx as below Will need  Saliva Using a sterile eyedropper pipette, collect saliva and place in a small sterile container which can be sealed securely. No preservatives or additional material should be added. Laboratory tests to be performed include detection of rabies RNA (by reverse transcription and polymerase chain reaction, RT/PCR, of extracted nucleic acids) and isolation of infectious virus in cell culture. Tracheal aspirates and sputum are not suitable for rabies tests. Neck Biopsy A section of skin 5 to 6 mm in diameter should be taken from the posterior region of the neck at the hairline. The biopsy specimen should contain a minimum of 10 hair follicles and be of sufficient depth to include the cutaneous nerves at the base of the follicle. Place the specimen on a piece of sterile gauze moistened with sterile water and place in a sealed container. Do not add preservatives or additional fluids. Laboratory tests to be performed include RT/PCR and immunofluorescent staining for viral antigen in frozen sections of the biopsy. Serum and cerebral spinal fluid (CSF) At least 0.5 ml each of serum and CSF should be collected; no preservatives should be added. Do not send whole blood. If no vaccine or rabies immune serum has been given, the presence of antibody to rabies virus in the serum is diagnostic and tests of CSF are unnecessary. Antibody to rabies virus in the CSF, regardless of the immunization history, suggests a rabies virus infection. Laboratory tests for antibody include indirect immunofluorescence and virus neutralization.

## 2016-01-18 ENCOUNTER — Inpatient Hospital Stay: Payer: Medicaid Other

## 2016-01-18 DIAGNOSIS — J9600 Acute respiratory failure, unspecified whether with hypoxia or hypercapnia: Secondary | ICD-10-CM | POA: Insufficient documentation

## 2016-01-18 LAB — BASIC METABOLIC PANEL
Anion gap: 5 (ref 5–15)
BUN: 68 mg/dL — AB (ref 6–20)
CHLORIDE: 115 mmol/L — AB (ref 101–111)
CO2: 22 mmol/L (ref 22–32)
CREATININE: 2.92 mg/dL — AB (ref 0.61–1.24)
Calcium: 8.3 mg/dL — ABNORMAL LOW (ref 8.9–10.3)
GFR calc Af Amer: 29 mL/min — ABNORMAL LOW (ref >60)
GFR calc non Af Amer: 25 mL/min — ABNORMAL LOW (ref >60)
Glucose, Bld: 113 mg/dL — ABNORMAL HIGH (ref 65–99)
POTASSIUM: 5.5 mmol/L — AB (ref 3.5–5.1)
SODIUM: 142 mmol/L (ref 135–145)

## 2016-01-18 LAB — GLUCOSE, CAPILLARY
Glucose-Capillary: 106 mg/dL — ABNORMAL HIGH (ref 65–99)
Glucose-Capillary: 135 mg/dL — ABNORMAL HIGH (ref 65–99)
Glucose-Capillary: 137 mg/dL — ABNORMAL HIGH (ref 65–99)
Glucose-Capillary: 150 mg/dL — ABNORMAL HIGH (ref 65–99)
Glucose-Capillary: 154 mg/dL — ABNORMAL HIGH (ref 65–99)
Glucose-Capillary: 164 mg/dL — ABNORMAL HIGH (ref 65–99)

## 2016-01-18 LAB — CBC
HEMATOCRIT: 30.8 % — AB (ref 40.0–52.0)
HEMOGLOBIN: 10.2 g/dL — AB (ref 13.0–18.0)
MCH: 30.9 pg (ref 26.0–34.0)
MCHC: 33.1 g/dL (ref 32.0–36.0)
MCV: 93.3 fL (ref 80.0–100.0)
Platelets: 94 10*3/uL — ABNORMAL LOW (ref 150–440)
RBC: 3.3 MIL/uL — AB (ref 4.40–5.90)
RDW: 14.7 % — ABNORMAL HIGH (ref 11.5–14.5)
WBC: 14.8 10*3/uL — ABNORMAL HIGH (ref 3.8–10.6)

## 2016-01-18 LAB — BLOOD GAS, ARTERIAL
Acid-base deficit: 5.8 mmol/L — ABNORMAL HIGH (ref 0.0–2.0)
Allens test (pass/fail): POSITIVE — AB
BICARBONATE: 21.9 meq/L (ref 21.0–28.0)
FIO2: 0.8
MECHANICAL RATE: 26
MECHVT: 500 mL
O2 Saturation: 99.8 %
PATIENT TEMPERATURE: 37
PEEP/CPAP: 10 cmH2O
PH ART: 7.25 — AB (ref 7.350–7.450)
PO2 ART: 242 mmHg — AB (ref 83.0–108.0)
pCO2 arterial: 50 mmHg — ABNORMAL HIGH (ref 32.0–48.0)

## 2016-01-18 LAB — VANCOMYCIN, RANDOM: Vancomycin Rm: 14 ug/mL

## 2016-01-18 MED ORDER — SODIUM CHLORIDE 0.9 % IV SOLN
3.0000 g | Freq: Four times a day (QID) | INTRAVENOUS | Status: DC
  Administered 2016-01-18 – 2016-01-19 (×6): 3 g via INTRAVENOUS
  Filled 2016-01-18 (×10): qty 3

## 2016-01-18 MED ORDER — FENTANYL BOLUS VIA INFUSION
100.0000 ug | INTRAVENOUS | Status: DC | PRN
  Administered 2016-01-18: 100 ug via INTRAVENOUS
  Filled 2016-01-18: qty 100

## 2016-01-18 MED ORDER — VITAL HIGH PROTEIN PO LIQD
1000.0000 mL | ORAL | Status: DC
  Administered 2016-01-18 – 2016-01-19 (×2): 1000 mL

## 2016-01-18 MED ORDER — STERILE WATER FOR INJECTION IJ SOLN
INTRAMUSCULAR | Status: AC
  Administered 2016-01-18: 13:00:00
  Filled 2016-01-18: qty 10

## 2016-01-18 MED ORDER — VECURONIUM BROMIDE 10 MG IV SOLR
10.0000 mg | Freq: Once | INTRAVENOUS | Status: DC
  Filled 2016-01-18: qty 10

## 2016-01-18 MED ORDER — FENTANYL BOLUS VIA INFUSION
50.0000 ug | INTRAVENOUS | Status: DC | PRN
Start: 2016-01-18 — End: 2016-01-20
  Administered 2016-01-18 – 2016-01-19 (×4): 50 ug via INTRAVENOUS
  Filled 2016-01-18: qty 50

## 2016-01-18 MED ORDER — POLYETHYLENE GLYCOL 3350 17 G PO PACK: 17.0000 g | PACK | Freq: Every day | ORAL | Status: DC | PRN

## 2016-01-18 MED ORDER — STERILE WATER FOR INJECTION IJ SOLN
INTRAMUSCULAR | Status: AC
  Administered 2016-01-18: 15:00:00
  Filled 2016-01-18: qty 10

## 2016-01-18 MED ORDER — ASPIRIN 81 MG PO CHEW
81.0000 mg | CHEWABLE_TABLET | Freq: Every day | ORAL | Status: DC
  Administered 2016-01-18 – 2016-01-19 (×2): 81 mg via ORAL
  Filled 2016-01-18 (×2): qty 1

## 2016-01-18 MED ORDER — VECURONIUM BROMIDE 10 MG IV SOLR
10.0000 mg | INTRAVENOUS | Status: DC | PRN
  Administered 2016-01-18 – 2016-01-19 (×10): 10 mg via INTRAVENOUS
  Filled 2016-01-18 (×8): qty 10

## 2016-01-18 MED ORDER — MIDAZOLAM BOLUS VIA INFUSION
4.0000 mg | INTRAVENOUS | Status: DC | PRN
  Administered 2016-01-18 (×3): 4 mg via INTRAVENOUS
  Filled 2016-01-18 (×2): qty 4

## 2016-01-18 MED ORDER — PRO-STAT SUGAR FREE PO LIQD
30.0000 mL | Freq: Three times a day (TID) | ORAL | Status: DC
  Administered 2016-01-18 – 2016-01-19 (×4): 30 mL

## 2016-01-18 NOTE — Procedures (Signed)
  [facility]            Bronchoscopy Note   FINDINGS/SUMMARY:   -Copious thin secretions throughout both airways, which were suctioned without difficulty. -Findings of acute bronchitis throughout both airways with moderately erythematous and edematous mucosa throughout both airways. -Washings taken from right lower lobe and sent for microbial culture, AFB, fungal culture.  Indication: The patient has had increasing right lower lobe infiltrate, FiO2 requirements have been Increasing, possibly due to mucous plug. The patient (or their representative) was informed of the risks (including but not limited to bleeding, infection, respiratory failure, lung injury, tooth/oral injury) and benefits of the procedure and gave consent, see chart.   Pre-op diagnosis: Pneumonia Post-op diagnosis: Same Estimated blood loss: Minimal to none  Medications for procedure: Fentanyl 150 g bolus, Versed 8 mg bolus, vecuronium 10 mg IV bolus.  Procedure description: After obtaining informed consent, a timeout was called to confirm the patient, procedure. The FiO2 on the ventilator was increased from 50% to 100% The patient was initially bolused with fentanyl and Versed, the ET tube was introduced via the endotracheal tube and brought to the main carina. The patient started to cough profusely, therefore, a paralytic bolus was given as indicated above. An anatomic tour was then undertaken, there was moderately erythematous mucosa throughout both airways as well as a moderate mucosal edema. There was a copious thin secretions throughout both airways, which were suctioned without difficulty. Subsequently, the bronchoscope was wedged in the right lower lobe, saline was instilled, and suctioned back with good returns. Specimens were sent for microbiology, AFB, fungal culture. The bronchoscope was then withdrawn and as this was done position of endotracheal tube was confirmed at 2 cm above the carina.  The FiO2 was decreased back down to 60%, the patient's oxygen saturation remained above 90% for the duration of the procedure.    Condition post procedure: Stable, unchanged.   Complications: No complications noted  Patient instructions: --   Wells Guiles, MD.  Board Certified in Internal Medicine, Pulmonary Medicine, Critical Care Medicine, and Sleep Medicine.  [facility] Office Number: [phone]  Santiago Glad, M.D.  Stephanie Acre, M.D.  Carolyne Fiscal, M.D  01/18/2016

## 2016-01-18 NOTE — Progress Notes (Signed)
Increased work of breathing, RR 34 and o2 sats in 70's.  RN gave patient 100% o2 burst and suctioned patient with no secretions removed from ETT suction. Fentanyl bolus given. Continuing to monitor

## 2016-01-18 NOTE — Progress Notes (Signed)
[facility] Progess Note    ASSESSMENT/PLAN   45 year old male past medical history of hypertension, coronary artery disease, admitted for unresponsiveness and respiratory failure Found to have acute RT sided CVA-thalamic stroke requiring full vent support. Course, complicated by persistent fevers, source uncertain.  PULMONARY Respiratory failure-unresponsive VDRF Need for MV -Seizure Lactic acidosis R Thalamic Lacunar infarct Chest x-ray 4/25 review, improved right atelectasis versus infiltrate noted. CT chest reviewed; RLL pneumonia and atelectasis.   P:  - cont with MV, wean as tolerated - maintain O2 sats >88% - SBT/SAT as tolerated - prn ABG - prn Bronchodilators - fentanyl/versed  - PCT>10.73 -Repeat cultures pending  CARDIOVASCULAR CVL RIJ A:  CAD HTN Hx of MI Hx of Coronary Angioplasty with stent placement-hemodynamically stable Possible NSTEMI Now with elevated blood pressure P:  -monitor BP - RIJ with guide wire in place - s\p guide wire removal by vascular surgery, and placement of new RIJ CVL - maintain SBP<140 - PRN labetalol/hydralazine, may need to start on the nicardipine infusion. If blood pressures remain elevated. - cardiology following, mild troponin elevation, heparin gtt stopped  RENAL Elevated Creatinine- improving P:  - BMP as needed - avoid nephrotoxic drugs - possibly due to chronic HTN  - Ultrasound of the kidneys negative for hydronephrosis  GASTROINTESTINAL Etoh Use Marijuana Use - PPI - tube feeds. Elevated transaminases, possibly due to sepsis  HEMATOLOGIC -CBC, leukocytosis, suspect due to sepsis.   INFECTIOUS A: high fevers P:  - f\u cultures - EMPIRIC ABX     Micro/culture results: BC  4/20-22-24>> negative UC 4/22&24>> negative thus far Sputum 4/22>> gram-positive cocci, further results pending. MRSA POSITIVE  Antibiotics: Antimicrobials this admission: Vancomycin  4/21 >>  Zosyn 4/21 >>  unysyn 01/18/16>>  ENDOCRINE Blood sugar checks intermittently  -NEUROLOGIC A:  Unresponsiveness Possible Seizure episode.  R Thalamic Lacunar Infarct P:  RASS goal: -1,0 - neuro following - seizure precautions.  - AEDs (Dilantin) - BP control  Lumbar puncture results pending   Best Practice: Code Status: Full. Diet: NPO GI prophylaxis: PPI. VTE prophylaxis: SCD's / F/U platelets, if normal Schedule SQ heaprin   MAJOR EVENTS/TEST RESULTS: EEG 01/13/16 IMPRESSION: This is an abnormal electroencephalogram due to general background slowing. Findings are consistent with current medications usage. No epileptiform activity is noted.   Echocardiogram 01/08/16 EF 55%, no other significant abnormalities noted.      ---------------------------------------   ----------------------------------------   Name: Duane Ada Sr. MRN: [id number] DOB: December 24, 1970    ADMISSION DATE:  01/12/2016    SUBJECTIVE Patient remains obtunded, patient has not made any improvements since he has been here. CXR is concerning for bilateral atelectasis and right lower lobe opacities in the . Therefore the team has decided to do bronch on him today.  . Temp:  [97.1 F (36.2 C)-103.6 F (39.8 C)] 98.3 F (36.8 C) (04/26 1200) Pulse Rate:  [76-115] 84 (04/26 1230) Resp:  [9-35] 26 (04/26 1230) BP: (109-184)/(69-130) 157/88 mmHg (04/26 1230) SpO2:  [89 %-100 %] 93 % (04/26 1230) FiO2 (%):  [40 %-90 %] 40 % (04/26 1219) Weight:  [131.9 kg (290 lb 12.6 oz)] 131.9 kg (290 lb 12.6 oz) (04/26 0500) HEMODYNAMICS:   VENTILATOR SETTINGS: Vent Mode:  [-] PRVC FiO2 (%):  [40 %-90 %] 40 % Set Rate:  [26 bmp] 26 bmp Vt Set:  [550 mL] 550 mL PEEP:  [10 cmH20] 10 cmH20 INTAKE / OUTPUT:  Intake/Output Summary (Last 24 hours) at 01/18/16 1312 Last  data filed at 01/18/16 1231  Gross per 24 hour  Intake   2114 ml  Output   2475 ml  Net   -361 ml     PHYSICAL EXAMINATION: Physical Examination:   VS: BP 157/88 mmHg  Pulse 84  Temp(Src) 98.3 F (36.8 C) (Rectal)  Resp 26  Ht  (1.854 m)  Wt 131.9 kg (290 lb 12.6 oz)  BMI 38.37 kg/m2  SpO2 93%  General Appearance: patient remains obtunded  Neuro:without focal findings, mental status unresponsive.  HEENT: PERRLA, EOM intact. Pulmonary: normal breath sounds  Decreased bilaterally, no wheezes ,crackles or rhonchi noted CardiovascularNormal S1,S2.  No m/r/g.   Abdomen: Benign, Soft, non-tender. Renal:  No costovertebral tenderness  GU:  Not performed at this time. Endocrine: No evident thyromegaly. Skin:   warm, no rashes, no ecchymosis  Extremities: normal, no cyanosis, clubbing.      LABORATORY PANEL:   CBC  Recent Labs Lab 01/18/16 0524  WBC 14.8*  HGB 10.2*  HCT 30.8*  PLT 94*    Chemistries   Recent Labs Lab 01/16/16 0514 01/17/16 0451 01/18/16 0524  NA  --  144 142  K  --  4.8 5.5*  CL  --  116* 115*  CO2  --  23 22  GLUCOSE  --  137* 113*  BUN  --  71* 68*  CREATININE  --  3.46* 2.92*  CALCIUM  --  8.2* 8.3*  MG 2.2  --   --   PHOS 2.3*  --   --   AST 407* 176*  --   ALT 1258* 891*  --   ALKPHOS 60 59  --   BILITOT 0.8 0.6  --      Recent Labs Lab 01/17/16 1646 01/17/16 2001 01/17/16 2351 01/18/16 0402 01/18/16 0828 01/18/16 1130  GLUCAP 143* 168* 137* 106* 135* 164*    Recent Labs Lab 01/14/16 2114 01/17/16 0500 01/18/16 0524  PHART 7.29* 7.30* 7.25*  PCO2ART 44 46 50*  PO2ART 402* 159* 242*    Recent Labs Lab 01/13/16 0601 01/16/16 0514 01/17/16 0451  AST 25 407* 176*  ALT 20 1258* 891*  ALKPHOS 61 60 59  BILITOT 0.7 0.8 0.6  ALBUMIN 3.4* 2.3* 2.2*    Cardiac Enzymes  Recent Labs Lab 01/12/16 1814  TROPONINI 0.10*    RADIOLOGY:  Ct Abdomen Pelvis Wo Contrast  01/16/2016  CLINICAL DATA:  Fevers of uncertain source.  Renal insufficiency. EXAM: CT CHEST, ABDOMEN AND PELVIS WITHOUT CONTRAST  TECHNIQUE: Multidetector CT imaging of the chest, abdomen and pelvis was performed following the standard protocol without IV contrast. COMPARISON:  Portable chest obtained earlier today. Chest, abdomen and pelvis CT dated 05/02/2010. FINDINGS: CT CHEST FINDINGS Mediastinum/Lymph Nodes: Endotracheal tube tip above the carina. Nasogastric or oral gastric tube extending into the stomach. Mild atheromatous coronary artery calcifications. No enlarged lymph nodes. Lungs/Pleura: Bilateral lower lobe atelectasis. Extensive patchy opacity in the right lower lobe. Mild patchy opacity in the adjacent inferior aspect of the right upper lobe. Small amount of atelectasis in the posterior, inferior aspect of the left upper lobe. No lung nodules or pleural fluid. Musculoskeletal: Mild thoracic spine degenerative changes. CT ABDOMEN PELVIS FINDINGS Hepatobiliary: Unremarkable liver and gallbladder. Pancreas: No mass or inflammatory process identified on this un-enhanced exam. Spleen: Within normal limits in size and appearance. Adrenals/Urinary Tract: Foley catheter in the urinary bladder with associated air in the bladder. No urinary tract calculi or hydronephrosis. Normal appearing adrenal glands. Stomach/Bowel: Orogastric  or nasogastric tube tip in the proximal stomach. No dilated bowel loops or evidence of appendicitis. The appendix is retrocecal in location and has a normal appearance. Vascular/Lymphatic: Mild atheromatous arterial calcifications. No aneurysm or enlarged lymph nodes. Reproductive: The left testis is in the inferior aspect of the left inguinal canal. Normal sized prostate gland. Other: Small proximal left inguinal hernia containing fat. Musculoskeletal:  Mild lumbar spine degenerative changes. IMPRESSION: 1. Extensive right lower lobe pneumonia and small amount of pneumonia in the adjacent right upper lobe. 2. Bilateral lower lobe atelectasis. 3. Incompletely descended or retracted left testis in the inferior  aspect of the left inguinal canal. 4. Small proximal left inguinal hernia containing fat. 5. Mild atheromatous coronary artery calcifications. Electronically Signed   By: Beckie Salts M.D.   On: 01/16/2016 19:17   Dg Chest 1 View  01/18/2016  CLINICAL DATA:  Shortness of breath. EXAM: CHEST 1 VIEW COMPARISON:  01/17/2016. FINDINGS: Endotracheal tube NG tube, and right IJ line stable position. Stable cardiomegaly. Interim partial clearing of right lower lobe infiltrate and/or pulmonary edema. Small residual right pleural effusion. Left lower lobe infiltrate/edema has cleared. Left pleural effusion has cleared. These findings are consistent with improving congestive heart failure. IMPRESSION: 1.  Lines and tubes in stable position. 2. Stable cardiomegaly. Interim partial clearing of right lower lobe infiltrate/pulmonary edema. Near complete clearing of right pleural effusion. Left lower lobe infiltrate and/or pulmonary edema and left pleural effusion has cleared. These findings are consistent with improving congestive heart failure. Electronically Signed   By: Maisie Fus  Register   On: 01/18/2016 07:26   Dg Chest 1 View  01/17/2016  CLINICAL DATA:  Respiratory failure, unresponsive. EXAM: CHEST 1 VIEW COMPARISON:  January 17, 2016 FINDINGS: The heart size is mildly enlarged. Endotracheal tube is identified in good position unchanged compared to prior exam. The endotracheal tube tip is 5 cm from carina. A right central venous line is unchanged. There are patchy opacities of bilateral lung bases. The bones are normal. IMPRESSION: Patchy opacity of bilateral lung bases at least in part due to atelectasis but superimposed pneumonia is not excluded. Bilateral pleural effusions. Electronically Signed   By: Sherian Rein M.D.   On: 01/17/2016 13:54   Dg Chest 1 View  01/17/2016  CLINICAL DATA:  Shortness of breath. EXAM: CHEST 1 VIEW COMPARISON:  CT 01/16/2016.  Chest x-ray 01/16/2016 . FINDINGS: Endotracheal tube and NG  tube in stable position. Right IJ line in stable position. Cardiomegaly. Interim significant partial clearing of basilar infiltrates/edema with mild residual, particular in the right lung base. Small right pleural effusion. No pneumothorax. IMPRESSION: 1. Lines and tubes in stable position. 2. Interim significant partial clearing of bilateral basilar infiltrates/edema. Persistent small right pleural effusion. 3. Stable cardiomegaly . Electronically Signed   By: Maisie Fus  Register   On: 01/17/2016 07:24   Ct Head Wo Contrast  01/16/2016  CLINICAL DATA:  Followup acute right thalamic stroke. EXAM: CT HEAD WITHOUT CONTRAST TECHNIQUE: Contiguous axial images were obtained from the base of the skull through the vertex without intravenous contrast. COMPARISON:  01/14/2016. FINDINGS: The previously demonstrated oval area of low density in the upper right thalamus is smaller and less distinct today. No intracranial hemorrhage or mass effect. Normal size and position of the ventricles. No new abnormalities. Almost complete opacification of the left maxillary and sphenoid sinuses is again demonstrated with left frontal, left ethmoid and right sphenoid sinus mucosal thickening. IMPRESSION: 1. No acute abnormality. 2. Interval decrease in size  and conspicuity of a right thalamic infarct. 3. Extensive chronic sinusitis. Electronically Signed   By: Beckie Salts M.D.   On: 01/16/2016 18:34   Ct Chest Wo Contrast  01/16/2016  CLINICAL DATA:  Fevers of uncertain source.  Renal insufficiency. EXAM: CT CHEST, ABDOMEN AND PELVIS WITHOUT CONTRAST TECHNIQUE: Multidetector CT imaging of the chest, abdomen and pelvis was performed following the standard protocol without IV contrast. COMPARISON:  Portable chest obtained earlier today. Chest, abdomen and pelvis CT dated 05/02/2010. FINDINGS: CT CHEST FINDINGS Mediastinum/Lymph Nodes: Endotracheal tube tip above the carina. Nasogastric or oral gastric tube extending into the stomach. Mild  atheromatous coronary artery calcifications. No enlarged lymph nodes. Lungs/Pleura: Bilateral lower lobe atelectasis. Extensive patchy opacity in the right lower lobe. Mild patchy opacity in the adjacent inferior aspect of the right upper lobe. Small amount of atelectasis in the posterior, inferior aspect of the left upper lobe. No lung nodules or pleural fluid. Musculoskeletal: Mild thoracic spine degenerative changes. CT ABDOMEN PELVIS FINDINGS Hepatobiliary: Unremarkable liver and gallbladder. Pancreas: No mass or inflammatory process identified on this un-enhanced exam. Spleen: Within normal limits in size and appearance. Adrenals/Urinary Tract: Foley catheter in the urinary bladder with associated air in the bladder. No urinary tract calculi or hydronephrosis. Normal appearing adrenal glands. Stomach/Bowel: Orogastric or nasogastric tube tip in the proximal stomach. No dilated bowel loops or evidence of appendicitis. The appendix is retrocecal in location and has a normal appearance. Vascular/Lymphatic: Mild atheromatous arterial calcifications. No aneurysm or enlarged lymph nodes. Reproductive: The left testis is in the inferior aspect of the left inguinal canal. Normal sized prostate gland. Other: Small proximal left inguinal hernia containing fat. Musculoskeletal:  Mild lumbar spine degenerative changes. IMPRESSION: 1. Extensive right lower lobe pneumonia and small amount of pneumonia in the adjacent right upper lobe. 2. Bilateral lower lobe atelectasis. 3. Incompletely descended or retracted left testis in the inferior aspect of the left inguinal canal. 4. Small proximal left inguinal hernia containing fat. 5. Mild atheromatous coronary artery calcifications. Electronically Signed   By: Beckie Salts M.D.   On: 01/16/2016 19:17   US Renal  01/16/2016  CLINICAL DATA:  Acute renal failure EXAM: RENAL / URINARY TRACT ULTRASOUND COMPLETE COMPARISON:  CT 05/02/2010 FINDINGS: Right Kidney: Length: 11.8 cm.  Parenchyma echogenic compared to the adjacent liver. No mass or hydronephrosis visualized. Left Kidney: Length: 12 cm. Echogenicity within normal limits. No mass or hydronephrosis visualized. Bladder: Decompressed by catheter. IMPRESSION: 1. Negative for hydronephrosis. 2. Echogenic renal parenchyma, a nonspecific indicator of medical renal disease. Electronically Signed   By: Corlis Leak M.D.   On: 01/16/2016 15:44   US Abdomen Limited Ruq  01/17/2016  CLINICAL DATA:  Sepsis.  Ventilated patient. EXAM: US ABDOMEN LIMITED - RIGHT UPPER QUADRANT COMPARISON:  CT 01/16/2016 and previous FINDINGS: Gallbladder: No gallstones or wall thickening visualized. No sonographic Murphy sign noted by sonographer. Only supine images could be obtained. Common bile duct: Diameter: 4.1 mm Liver: No focal lesion identified. Within normal limits in parenchymal echogenicity. IMPRESSION: Negative.  Normal gallbladder. Electronically Signed   By: Corlis Leak M.D.   On: 01/17/2016 12:06   Dg Fluoro Guided Loc Of Needle/cath Tip For Spinal Inject Lt  01/17/2016  CLINICAL DATA:  Encephalitis. EXAM: DIAGNOSTIC LUMBAR PUNCTURE UNDER FLUOROSCOPIC GUIDANCE FLUOROSCOPY TIME:  Radiation Exposure Index (as provided by the fluoroscopic device): 17.0 mGy PROCEDURE: Informed consent was obtained from the patient prior to the procedure, including potential complications of headache, allergy, and pain. With  the patient prone, the lower back was prepped with Betadine. 1% Lidocaine was used for local anesthesia. Lumbar puncture was performed at the L4-L5 level using a 22 gauge needle with return of clear CSF with an opening pressure of 35 cm water. Fourteen ml of CSF were obtained for laboratory studies. The patient tolerated the procedure well and there were no apparent complications. Hemostasis was achieved. IMPRESSION: Successful fluoroscopic guided lumbar puncture . Electronically Signed   By: Maisie Fus  Register   On: 01/17/2016 17:09      Bincy  Varughese,AG-ACNP Pulmonary & Critical Care   Patient seen and examined with nurse practitioner, I agree with the assessment and plan as indicated above, except where indicated. Patient continues to be minimally responsive today, with reduced mental status. His FiO2 has a decreased in terms of requirements, chest x-ray shows continued right sided atelectasis, there is decreased air entry in the right base. Current therapy, will check bronchoscopy for possible mucous plugging. Wells Guiles M.D. 01/18/2016 Critical Care Attestation.  I have personally obtained a history, examined the patient, evaluated laboratory and imaging results, formulated the assessment and plan and placed orders. The Patient requires high complexity decision making for assessment and support, frequent evaluation and titration of therapies, application of advanced monitoring technologies and extensive interpretation of multiple databases. The patient has critical illness that could lead imminently to failure of 1 or more organ systems and requires the highest level of physician preparedness to intervene.  Critical Care Time devoted to patient care services described in this note is 35 minutes and is exclusive of time spent in procedures.

## 2016-01-18 NOTE — Discharge Summary (Addendum)
Physician Discharge Summary  Patient ID: Duane Chen. MRN: [id number] DOB/AGE: 1971-02-11 45 y.o.  Admit date: 01/12/2016 Discharge date: 01/18/2016    Discharge Diagnoses: Respiratory failure-unresponsive VDRF Need for MV -Seizure Lactic acidosis with sepsis.  R Thalamic Lacunar infarct CAD HTN Hx of MI Hx of Coronary Angioplasty with stent placement-hemodynamically stable Possible NSTEMI-- suspect troponin leak due to sepsis vs true NSTEMI. Elevated Creatinine- improving Etoh Use Marijuana Use high fevers                                                                      DISCHARGE PLAN BY DIAGNOSIS     PULMONARY Respiratory failure-unresponsive VDRF Need for MV -Seizure Lactic acidosis R Thalamic Lacunar infarct Chest x-ray 4/25 review, improved right atelectasis versus infiltrate noted. CT chest reviewed; RLL pneumonia and atelectasis.   P:  - cont with MV, wean as tolerated - maintain O2 sats >88% - SBT/SAT as tolerated - prn ABG - prn Bronchodilators - fentanyl/versed  -Repeat cultures pending  CARDIOVASCULAR CVL RIJ A:  CAD HTN Hx of MI Hx of Coronary Angioplasty with stent placement-hemodynamically stable Possible NSTEMI Now with elevated blood pressure P:  -monitor BP - RIJ with guide wire in place - s\p guide wire removal by vascular surgery, and placement of new RIJ CVL - maintain SBP<140 - PRN labetalol/hydralazine, may need to start on the nicardipine infusion. If blood pressures remain elevated. - cardiology following, mild troponin elevation, heparin gtt stopped  RENAL Elevated Creatinine- improving P:  - BMP as needed - avoid nephrotoxic drugs - possibly due to chronic HTN  - Ultrasound of the kidneys negative for hydronephrosis  GASTROINTESTINAL Etoh Use Marijuana Use - PPI - tube feeds. Elevated transaminases, possibly due to sepsis  HEMATOLOGIC -CBC, leukocytosis, suspect due to sepsis.    INFECTIOUS A: high fevers P:  - f\u cultures - EMPIRIC ABX    Micro/culture results: BC 4/20-22-24>> negative UC 4/22&24>> negative thus far Sputum 4/22>> gram-positive cocci, further results pending. MRSA POSITIVE  Antibiotics: Antimicrobials this admission: Vancomycin 4/21 >>  Zosyn 4/21 >>  unysyn 01/18/16>> Ciprofloxacin 4/27>>  ENDOCRINE Blood sugar checks intermittently  -NEUROLOGIC A:  Unresponsiveness Possible Seizure episode.  R Thalamic Lacunar Infarct P:  RASS goal: -1,0 - neuro following - seizure precautions.  - AEDs (Dilantin) - BP control  Lumbar puncture results pending                DISCHARGE SUMMARY   Duane Chen is a 45 year old male was brought to the ED on 4/20 by EMS. He woke up from his sleep and then fell as per his wife, he was unresponsive. He hit his head upon falling. He did not require CPR in the emergency department. Patient appeared to have seizures with loss of urinary continence, he was also noted to be posturing. CT of the head was negative for any acute bleed or stroke. Patient was found to be  Hypertensive. No prior history significant for coronary artery disease or hypertension noted. Patient's wife admits that he has been abusing alcohol and marijuana. Patient was found to be obtunded and required emergency intubation. Patient remains obtunded with no signs of improvement. He has been hypertensive, febrile, requiring full vent support.  Patient was  started on broad-spectrum antibiotics. Neurology was consulted on 4/20 and EEG was obtained, EEG was negative. Cardiology was consulted, and cleared him for any acute coronary event.  Patient continues to be febrile off and on, with no known reasons.  His cultures remains negative up to 4/27. Patient continues to be in disharmony with ventilator when tried to wean off from sedation.  Patient was recommended by ID Dr. to have LP (LP performed on 4/25) done and send the  specimen for arboviral testing and rabies testing as well as routine studies and HSV, results are pending . (rabies testing has reportedly come back as negative).  Nephrologist was consulted due to poor kidney functions but  No acute indication for dialysis was felt. On 4/26 patient was bronched by Dr. Nicholos Johns due to increased demands in FiO2, results from BAL are awaited. Patient had mucus plugs removed at bronchoscopy, and Fi)2 requirement have decreased down to 50%.  Patient has made no signs of improvement since that time he is present here with [place].  His mental status has not improved. Patient continues to be febrile without a reason for sepsis.   Discussed with [facility], will be accepting patient pending bed availability. Discharge summary updated and CD with images made.   Pending CSF studies--.  Varicella Zoster PCR.  Cryptocccal Antigen.  Cytology.  Arbovirus Panel (sent to [facility] state lab).  Entervirus panel PCR.  HSV DNA.   Pending bronchoscopy studies: AFB, culture, fungal culture.   SIGNIFICANT DIAGNOSTIC STUDIES 4/22 CT head without contrast- no acute abnormality. Interval decrease in size of right thalamic infarct 4/22 CT head without contrast concerning for possible subacute versus chronic lacunar infarct in the right thalamus, which appears larger than the most recent prior CT. No other intracranial abnormality. 4/24 CT chest concerning for extensive right lower lobe pneumonia and small amount of pneumonia in the adjacent right upper lobe. Bilateral lower lobe atelectasis. I  4/20 EEG reviewed by neurologist negative for any epileptiform activity 4/26; Bronchoscopy for mucus plugs.   SIGNIFICANT EVENTS 4/20 brought to the ED obtunded  MICRO DATA  BC 4/20-22-24>> negative UC 4/22&24>> negative thus far  ANTIBIOTICS Vancomycin 4/21 >>  Zosyn 4/21 >>  unysyn 01/18/16>> Gentamycin 4/27 Acylovir 4/27>> Doxycycline 4/24>>  CONSULTS 4/24 nephrology>> 4/24  infectious disease>>  TUBES / LINES 4/20 endotracheal tube>> 4/20 right IJ TLC>>  Discharge Exam: General Appearance: patient remains obtunded  Neuro:without focal findings, mental status unresponsive.  HEENT: Atraumatic, normocephalic, no JVD appreciated, lots of oral secretion noted Pulmonary: normal breath sounds Decreased bilaterally, no wheezes ,crackles or rhonchi noted CardiovascularNormal S1,S2. No m/r/g.  Abdomen: Benign, Soft, non-tender. Renal: No costovertebral tenderness  GU: Not performed at this time. Endocrine: No evident thyromegaly. Skin: warm, no rashes, no ecchymosis  Extremities: normal, no cyanosis, clubbing.     Filed Vitals:   01/18/16 1615 01/18/16 1630 01/18/16 1645 01/18/16 1700  BP: 166/88 169/88 176/96 179/101  Pulse: 102 102 103 104  Temp: 101.3 F (38.5 C) 101.3 F (38.5 C) 101.3 F (38.5 C) 101.3 F (38.5 C)  TempSrc:      Resp: 26 26 26 27   Height:      Weight:      SpO2: 97% 100% 100% 87%     Discharge Labs  BMET  Recent Labs Lab 01/13/16 0601 01/14/16 0610 01/15/16 0559 01/16/16 0237 01/16/16 0514 01/17/16 0451 01/18/16 0524  NA 143 142  --  142  --  144 142  K 3.8 4.5  --  4.2  --  4.8 5.5*  CL 114* 110  --  115*  --  116* 115*  CO2 24 23  --  19*  --  23 22  GLUCOSE 162* 170*  --  277*  --  137* 113*  BUN 17 39*  --  70*  --  71* 68*  CREATININE 1.14 4.01* 5.17* 4.33*  --  3.46* 2.92*  CALCIUM 8.8* 8.2*  --  7.8*  --  8.2* 8.3*  MG  --   --   --   --  2.2  --   --   PHOS  --   --   --   --  2.3*  --   --     CBC  Recent Labs Lab 01/16/16 0514 01/17/16 0451 01/18/16 0524  HGB 11.6* 10.6* 10.2*  HCT 35.2* 32.3* 30.8*  WBC 18.1* 15.7* 14.8*  PLT 52* 70* 94*    Anti-Coagulation  Recent Labs Lab 01/12/16 0134 01/17/16 1036  INR 1.10 1.23            Medication List    ASK your doctor about these medications        aspirin EC 81 MG tablet  Take 81 mg by mouth daily.      atorvastatin 40 MG tablet  Commonly known as:  LIPITOR  Take 1 tablet (40 mg total) by mouth daily at 6 PM.     hydrochlorothiazide 25 MG tablet  Commonly known as:  HYDRODIURIL  Take 1 tablet (25 mg total) by mouth daily.     metoprolol tartrate 25 MG tablet  Commonly known as:  LOPRESSOR  Take 1 tablet (25 mg total) by mouth 2 (two) times daily.         Disposition: [facility]   Time spent on disposition:  Greater than 45 minutes.     Wells Guiles, M.D.  Pulmonary & Critical Care

## 2016-01-18 NOTE — Progress Notes (Signed)
Dr. Nicholos Johns gave order to give boluses of versed and fentanyl via infusion PRN for bronchoscopy.

## 2016-01-18 NOTE — Progress Notes (Signed)
[place] INFECTIOUS DISEASE PROGRESS NOTE Date of Admission:  01/12/2016     ID: Duane Myrtle Sr. is a 45 y.o. male with AMS, fevers,  Active Problems:   Respiratory distress   Seizure-like activity ([place])   Acute respiratory failure with hypoxia (HCC)   Hypertensive encephalopathy   Respiratory failure ([place])   Acute respiratory failure (HCC)   Subjective: Remains unresponsive, fevers persist Had LP and nuchal bxp done yesterday  ROS  Unable to obtain  Medications:  Antibiotics Given (last 72 hours)    Date/Time Action Medication Dose Rate   01/15/16 2205 Given   piperacillin-tazobactam (ZOSYN) IVPB 3.375 g 3.375 g 12.5 mL/hr   01/16/16 0707 Given   vancomycin (VANCOCIN) 1,250 mg in sodium chloride 0.9 % 250 mL IVPB 1,250 mg 166.7 mL/hr   01/16/16 1017 Given   piperacillin-tazobactam (ZOSYN) IVPB 3.375 g 3.375 g 12.5 mL/hr   01/16/16 1514 Given   piperacillin-tazobactam (ZOSYN) IVPB 3.375 g 3.375 g 12.5 mL/hr   01/16/16 2130 Given   doxycycline (VIBRAMYCIN) 100 mg in dextrose 5 % 250 mL IVPB 100 mg 125 mL/hr   01/17/16 0006 Given   meropenem (MERREM) 2 g in sodium chloride 0.9 % 100 mL IVPB 2 g 200 mL/hr   01/17/16 0858 Given   vancomycin (VANCOCIN) 1,500 mg in sodium chloride 0.9 % 500 mL IVPB 1,500 mg 250 mL/hr   01/17/16 1150 Given   doxycycline (VIBRAMYCIN) 100 mg in dextrose 5 % 250 mL IVPB 100 mg 125 mL/hr   01/17/16 1150 Given   meropenem (MERREM) 2 g in sodium chloride 0.9 % 100 mL IVPB 2 g 200 mL/hr   01/17/16 2124 Given   doxycycline (VIBRAMYCIN) 100 mg in dextrose 5 % 250 mL IVPB 100 mg 125 mL/hr   01/17/16 2132 Given   meropenem (MERREM) 2 g in sodium chloride 0.9 % 100 mL IVPB 2 g 200 mL/hr   01/18/16 0906 Given   vancomycin (VANCOCIN) 1,500 mg in sodium chloride 0.9 % 500 mL IVPB 1,500 mg 250 mL/hr   01/18/16 0909 Given   Ampicillin-Sulbactam (UNASYN) 3 g in sodium chloride 0.9 % 100 mL IVPB 3 g 100 mL/hr   01/18/16 0917 Given   doxycycline  (VIBRAMYCIN) 100 mg in dextrose 5 % 250 mL IVPB 100 mg 125 mL/hr     . sodium chloride   Intravenous Once  . ampicillin-sulbactam (UNASYN) IV  3 g Intravenous Q6H  . antiseptic oral rinse  7 mL Mouth Rinse QID  . aspirin  81 mg Oral Daily  . chlorhexidine gluconate (SAGE KIT)  15 mL Mouth Rinse BID  . cloNIDine  0.1 mg Transdermal Weekly  . diazepam  5 mg Per NG tube Q6H  . docusate  100 mg Per Tube BID  . doxycycline (VIBRAMYCIN) IV  100 mg Intravenous Q12H  . feeding supplement (PRO-STAT SUGAR FREE 64)  30 mL Per Tube TID  . free water  100 mL Per Tube Q8H  . labetalol  20 mg Intravenous Q6H  . lacosamide (VIMPAT) IV  100 mg Intravenous Q12H  . pantoprazole (PROTONIX) IV  40 mg Intravenous Q24H  . sennosides  5 mL Oral BID  . vancomycin  1,500 mg Intravenous Q24H  . vecuronium  10 mg Intravenous Once    Objective: Vital signs in last 24 hours: Temp:  [97.1 F (36.2 C)-103.6 F (39.8 C)] 98.3 F (36.8 C) (04/26 1200) Pulse Rate:  [76-115] 95 (04/26 1330) Resp:  [9-35] 26 (04/26 1330)  BP: (109-184)/(69-130) 160/93 mmHg (04/26 1330) SpO2:  [74 %-100 %] 95 % (04/26 1330) FiO2 (%):  [40 %-90 %] 40 % (04/26 1219) Weight:  [131.9 kg (290 lb 12.6 oz)] 131.9 kg (290 lb 12.6 oz) (04/26 0500) Constitutional: crtically ill appearing, unresponsive even to sternal rub, on fentanyl versed HENT: anicteric pupils pinpoint Mouth/Throat: Oropharynx ett and ogt in place  Neck is supple  Cardiovascular: tachy, reg Pulmonary/Chest: Effort normal and breath sounds normal.  Abdominal: Soft. Bowel sounds are normal. He exhibits no distension. There is no tenderness.  Lymphadenopathy: He has no cervical adenopathy.  Neurological: unresponsive, no clonus,  Skin: Skin is warm and dry. No rash noted. No erythema.  Psychiatric: unresponisive Lines- R IJ wnl Foley in place  Lab Results  Recent Labs  01/17/16 0451 01/18/16 0524  WBC 15.7* 14.8*  HGB 10.6* 10.2*  HCT 32.3* 30.8*  NA 144  142  K 4.8 5.5*  CL 116* 115*  CO2 23 22  BUN 71* 68*  CREATININE 3.46* 2.92*    Microbiology: Results for orders placed or performed during the hospital encounter of 01/12/16  MRSA PCR Screening     Status: Abnormal   Collection Time: 01/12/16  6:01 AM  Result Value Ref Range Status   MRSA by PCR POSITIVE (A) NEGATIVE Final    Comment:        The GeneXpert MRSA Assay (FDA approved for NASAL specimens only), is one component of a comprehensive MRSA colonization surveillance program. It is not intended to diagnose MRSA infection nor to guide or monitor treatment for MRSA infections. CRITICAL RESULT CALLED TO, READ BACK BY AND VERIFIED WITH: PAM MYERS 01/12/16 0751 SGD   Culture, blood (Routine X 2) w Reflex to ID Panel     Status: None   Collection Time: 01/12/16  4:54 PM  Result Value Ref Range Status   Specimen Description BLOOD LEFT HAND  Final   Special Requests BOTTLES DRAWN AEROBIC AND ANAEROBIC [place]  Final   Culture NO GROWTH 5 DAYS  Final   Report Status 01/17/2016 FINAL  Final  Culture, blood (Routine X 2) w Reflex to ID Panel     Status: None   Collection Time: 01/12/16  5:04 PM  Result Value Ref Range Status   Specimen Description BLOOD LEFT ARM  Final   Special Requests   Final    BOTTLES DRAWN AEROBIC AND ANAEROBIC  AERO 10CC ANA 5CC   Culture NO GROWTH 5 DAYS  Final   Report Status 01/17/2016 FINAL  Final  Culture, expectorated sputum-assessment     Status: None   Collection Time: 01/14/16  4:26 AM  Result Value Ref Range Status   Specimen Description TRACHEAL ASPIRATE  Final   Special Requests NONE  Final   Sputum evaluation THIS SPECIMEN IS ACCEPTABLE FOR SPUTUM CULTURE  Final   Report Status 01/14/2016 FINAL  Final  Culture, respiratory (NON-Expectorated)     Status: None   Collection Time: 01/14/16  4:26 AM  Result Value Ref Range Status   Specimen Description TRACHEAL ASPIRATE  Final   Special Requests NONE Reflexed from [id number]  Final    Gram Stain FEW WBC SEEN FEW GRAM POSITIVE COCCI   Final   Culture MODERATE GROWTH STAPHYLOCOCCUS AUREUS  Final   Report Status 01/16/2016 FINAL  Final   Organism ID, Bacteria STAPHYLOCOCCUS AUREUS  Final      Susceptibility   Staphylococcus aureus - MIC*    CIPROFLOXACIN <=0.5 SENSITIVE Sensitive  ERYTHROMYCIN 2 RESISTANT Resistant     GENTAMICIN <=0.5 SENSITIVE Sensitive     OXACILLIN 0.5 SENSITIVE Sensitive     TETRACYCLINE <=1 SENSITIVE Sensitive     VANCOMYCIN 1 SENSITIVE Sensitive     TRIMETH/SULFA <=10 SENSITIVE Sensitive     CLINDAMYCIN <=0.25 RESISTANT Resistant     RIFAMPIN <=0.5 SENSITIVE Sensitive     Inducible Clindamycin Value in next row Resistant      POSITIVEINDUCIBLE CLINDAMYCIN RESISTANCE - A positive ICR test is indicative of inducible resistance to macrolides, lincosamides, and type B streptogramin.  This isolate is presumed to be resistant to Clindamycin, however, Clindamycin may still be effective in some patients.     * MODERATE GROWTH STAPHYLOCOCCUS AUREUS  Culture, blood (Routine X 2) w Reflex to ID Panel     Status: None (Preliminary result)   Collection Time: 01/14/16  4:46 AM  Result Value Ref Range Status   Specimen Description BLOOD LEFT ASSIST CONTROL  Final   Special Requests   Final    BOTTLES DRAWN AEROBIC AND ANAEROBIC 15CCAERO,12CCANA   Culture NO GROWTH 4 DAYS  Final   Report Status PENDING  Incomplete  Culture, blood (Routine X 2) w Reflex to ID Panel     Status: None (Preliminary result)   Collection Time: 01/14/16  5:06 AM  Result Value Ref Range Status   Specimen Description BLOOD RIGHT ASSIST CONTROL  Final   Special Requests   Final    BOTTLES DRAWN AEROBIC AND ANAEROBIC 12CCAERO,12CCANA   Culture NO GROWTH 3 DAYS  Final   Report Status PENDING  Incomplete  Urine culture     Status: None   Collection Time: 01/14/16  6:28 AM  Result Value Ref Range Status   Specimen Description URINE, RANDOM  Final   Special Requests NONE  Final    Culture NO GROWTH 1 DAY  Final   Report Status 01/15/2016 FINAL  Final  Culture, blood (Routine X 2) w Reflex to ID Panel     Status: None (Preliminary result)   Collection Time: 01/16/16  8:03 AM  Result Value Ref Range Status   Specimen Description BLOOD RIGHT HAND  Final   Special Requests   Final    BOTTLES DRAWN AEROBIC AND ANAEROBIC AER 1ML ANA .5ML   Culture NO GROWTH 1 DAY  Final   Report Status PENDING  Incomplete  Culture, blood (Routine X 2) w Reflex to ID Panel     Status: None (Preliminary result)   Collection Time: 01/16/16  8:04 AM  Result Value Ref Range Status   Specimen Description BLOOD LEFT HAND  Final   Special Requests   Final    BOTTLES DRAWN AEROBIC AND ANAEROBIC AER 1ML ANA .5ML   Culture NO GROWTH 1 DAY  Final   Report Status PENDING  Incomplete  Urine culture     Status: None   Collection Time: 01/16/16  8:58 AM  Result Value Ref Range Status   Specimen Description URINE, RANDOM  Final   Special Requests NONE  Final   Culture NO GROWTH 1 DAY  Final   Report Status 01/17/2016 FINAL  Final  Culture, expectorated sputum-assessment     Status: None   Collection Time: 01/16/16 12:10 PM  Result Value Ref Range Status   Specimen Description TRACHEAL ASPIRATE  Final   Special Requests NONE  Final   Sputum evaluation THIS SPECIMEN IS ACCEPTABLE FOR SPUTUM CULTURE  Final   Report Status 01/16/2016 FINAL  Final  Culture,  respiratory (NON-Expectorated)     Status: None (Preliminary result)   Collection Time: 01/16/16 12:10 PM  Result Value Ref Range Status   Specimen Description TRACHEAL ASPIRATE  Final   Special Requests NONE Reflexed from [id number]  Final   Gram Stain RARE WBC SEEN NO ORGANISMS SEEN   Final   Culture   Final    LIGHT GROWTH STAPHYLOCOCCUS AUREUS SUSCEPTIBILITIES TO FOLLOW    Report Status PENDING  Incomplete  CSF culture     Status: None (Preliminary result)   Collection Time: 01/17/16  4:22 PM  Result Value Ref Range Status   Specimen  Description CSF  Final   Special Requests NONE  Final   Gram Stain NO ORGANISMS SEEN RARE WBC SEEN   Final   Culture NO GROWTH < 24 HOURS  Final   Report Status PENDING  Incomplete    Studies/Results: Ct Abdomen Pelvis Wo Contrast  01/16/2016  CLINICAL DATA:  Fevers of uncertain source.  Renal insufficiency. EXAM: CT CHEST, ABDOMEN AND PELVIS WITHOUT CONTRAST TECHNIQUE: Multidetector CT imaging of the chest, abdomen and pelvis was performed following the standard protocol without IV contrast. COMPARISON:  Portable chest obtained earlier today. Chest, abdomen and pelvis CT dated 05/02/2010. FINDINGS: CT CHEST FINDINGS Mediastinum/Lymph Nodes: Endotracheal tube tip above the carina. Nasogastric or oral gastric tube extending into the stomach. Mild atheromatous coronary artery calcifications. No enlarged lymph nodes. Lungs/Pleura: Bilateral lower lobe atelectasis. Extensive patchy opacity in the right lower lobe. Mild patchy opacity in the adjacent inferior aspect of the right upper lobe. Small amount of atelectasis in the posterior, inferior aspect of the left upper lobe. No lung nodules or pleural fluid. Musculoskeletal: Mild thoracic spine degenerative changes. CT ABDOMEN PELVIS FINDINGS Hepatobiliary: Unremarkable liver and gallbladder. Pancreas: No mass or inflammatory process identified on this un-enhanced exam. Spleen: Within normal limits in size and appearance. Adrenals/Urinary Tract: Foley catheter in the urinary bladder with associated air in the bladder. No urinary tract calculi or hydronephrosis. Normal appearing adrenal glands. Stomach/Bowel: Orogastric or nasogastric tube tip in the proximal stomach. No dilated bowel loops or evidence of appendicitis. The appendix is retrocecal in location and has a normal appearance. Vascular/Lymphatic: Mild atheromatous arterial calcifications. No aneurysm or enlarged lymph nodes. Reproductive: The left testis is in the inferior aspect of the left inguinal  canal. Normal sized prostate gland. Other: Small proximal left inguinal hernia containing fat. Musculoskeletal:  Mild lumbar spine degenerative changes. IMPRESSION: 1. Extensive right lower lobe pneumonia and small amount of pneumonia in the adjacent right upper lobe. 2. Bilateral lower lobe atelectasis. 3. Incompletely descended or retracted left testis in the inferior aspect of the left inguinal canal. 4. Small proximal left inguinal hernia containing fat. 5. Mild atheromatous coronary artery calcifications. Electronically Signed   By: Claudie Revering M.D.   On: 01/16/2016 19:17   Dg Chest 1 View  01/18/2016  CLINICAL DATA:  Shortness of breath. EXAM: CHEST 1 VIEW COMPARISON:  01/17/2016. FINDINGS: Endotracheal tube NG tube, and right IJ line stable position. Stable cardiomegaly. Interim partial clearing of right lower lobe infiltrate and/or pulmonary edema. Small residual right pleural effusion. Left lower lobe infiltrate/edema has cleared. Left pleural effusion has cleared. These findings are consistent with improving congestive heart failure. IMPRESSION: 1.  Lines and tubes in stable position. 2. Stable cardiomegaly. Interim partial clearing of right lower lobe infiltrate/pulmonary edema. Near complete clearing of right pleural effusion. Left lower lobe infiltrate and/or pulmonary edema and left pleural effusion has cleared. These findings  are consistent with improving congestive heart failure. Electronically Signed   By: Marcello Moores  Register   On: 01/18/2016 07:26   Dg Chest 1 View  01/17/2016  CLINICAL DATA:  Respiratory failure, unresponsive. EXAM: CHEST 1 VIEW COMPARISON:  January 17, 2016 FINDINGS: The heart size is mildly enlarged. Endotracheal tube is identified in good position unchanged compared to prior exam. The endotracheal tube tip is 5 cm from carina. A right central venous line is unchanged. There are patchy opacities of bilateral lung bases. The bones are normal. IMPRESSION: Patchy opacity of  bilateral lung bases at least in part due to atelectasis but superimposed pneumonia is not excluded. Bilateral pleural effusions. Electronically Signed   By: Abelardo Diesel M.D.   On: 01/17/2016 13:54   Dg Chest 1 View  01/17/2016  CLINICAL DATA:  Shortness of breath. EXAM: CHEST 1 VIEW COMPARISON:  CT 01/16/2016.  Chest x-ray 01/16/2016 . FINDINGS: Endotracheal tube and NG tube in stable position. Right IJ line in stable position. Cardiomegaly. Interim significant partial clearing of basilar infiltrates/edema with mild residual, particular in the right lung base. Small right pleural effusion. No pneumothorax. IMPRESSION: 1. Lines and tubes in stable position. 2. Interim significant partial clearing of bilateral basilar infiltrates/edema. Persistent small right pleural effusion. 3. Stable cardiomegaly . Electronically Signed   By: Marcello Moores  Register   On: 01/17/2016 07:24   Ct Head Wo Contrast  01/16/2016  CLINICAL DATA:  Followup acute right thalamic stroke. EXAM: CT HEAD WITHOUT CONTRAST TECHNIQUE: Contiguous axial images were obtained from the base of the skull through the vertex without intravenous contrast. COMPARISON:  01/14/2016. FINDINGS: The previously demonstrated oval area of low density in the upper right thalamus is smaller and less distinct today. No intracranial hemorrhage or mass effect. Normal size and position of the ventricles. No new abnormalities. Almost complete opacification of the left maxillary and sphenoid sinuses is again demonstrated with left frontal, left ethmoid and right sphenoid sinus mucosal thickening. IMPRESSION: 1. No acute abnormality. 2. Interval decrease in size and conspicuity of a right thalamic infarct. 3. Extensive chronic sinusitis. Electronically Signed   By: Claudie Revering M.D.   On: 01/16/2016 18:34   Ct Chest Wo Contrast  01/16/2016  CLINICAL DATA:  Fevers of uncertain source.  Renal insufficiency. EXAM: CT CHEST, ABDOMEN AND PELVIS WITHOUT CONTRAST TECHNIQUE:  Multidetector CT imaging of the chest, abdomen and pelvis was performed following the standard protocol without IV contrast. COMPARISON:  Portable chest obtained earlier today. Chest, abdomen and pelvis CT dated 05/02/2010. FINDINGS: CT CHEST FINDINGS Mediastinum/Lymph Nodes: Endotracheal tube tip above the carina. Nasogastric or oral gastric tube extending into the stomach. Mild atheromatous coronary artery calcifications. No enlarged lymph nodes. Lungs/Pleura: Bilateral lower lobe atelectasis. Extensive patchy opacity in the right lower lobe. Mild patchy opacity in the adjacent inferior aspect of the right upper lobe. Small amount of atelectasis in the posterior, inferior aspect of the left upper lobe. No lung nodules or pleural fluid. Musculoskeletal: Mild thoracic spine degenerative changes. CT ABDOMEN PELVIS FINDINGS Hepatobiliary: Unremarkable liver and gallbladder. Pancreas: No mass or inflammatory process identified on this un-enhanced exam. Spleen: Within normal limits in size and appearance. Adrenals/Urinary Tract: Foley catheter in the urinary bladder with associated air in the bladder. No urinary tract calculi or hydronephrosis. Normal appearing adrenal glands. Stomach/Bowel: Orogastric or nasogastric tube tip in the proximal stomach. No dilated bowel loops or evidence of appendicitis. The appendix is retrocecal in location and has a normal appearance. Vascular/Lymphatic: Mild atheromatous arterial  calcifications. No aneurysm or enlarged lymph nodes. Reproductive: The left testis is in the inferior aspect of the left inguinal canal. Normal sized prostate gland. Other: Small proximal left inguinal hernia containing fat. Musculoskeletal:  Mild lumbar spine degenerative changes. IMPRESSION: 1. Extensive right lower lobe pneumonia and small amount of pneumonia in the adjacent right upper lobe. 2. Bilateral lower lobe atelectasis. 3. Incompletely descended or retracted left testis in the inferior aspect of  the left inguinal canal. 4. Small proximal left inguinal hernia containing fat. 5. Mild atheromatous coronary artery calcifications. Electronically Signed   By: Claudie Revering M.D.   On: 01/16/2016 19:17   US Renal  01/16/2016  CLINICAL DATA:  Acute renal failure EXAM: RENAL / URINARY TRACT ULTRASOUND COMPLETE COMPARISON:  CT 05/02/2010 FINDINGS: Right Kidney: Length: 11.8 cm. Parenchyma echogenic compared to the adjacent liver. No mass or hydronephrosis visualized. Left Kidney: Length: 12 cm. Echogenicity within normal limits. No mass or hydronephrosis visualized. Bladder: Decompressed by catheter. IMPRESSION: 1. Negative for hydronephrosis. 2. Echogenic renal parenchyma, a nonspecific indicator of medical renal disease. Electronically Signed   By: Lucrezia Europe M.D.   On: 01/16/2016 15:44   US Abdomen Limited Ruq  01/17/2016  CLINICAL DATA:  Sepsis.  Ventilated patient. EXAM: US ABDOMEN LIMITED - RIGHT UPPER QUADRANT COMPARISON:  CT 01/16/2016 and previous FINDINGS: Gallbladder: No gallstones or wall thickening visualized. No sonographic Murphy sign noted by sonographer. Only supine images could be obtained. Common bile duct: Diameter: 4.1 mm Liver: No focal lesion identified. Within normal limits in parenchymal echogenicity. IMPRESSION: Negative.  Normal gallbladder. Electronically Signed   By: Lucrezia Europe M.D.   On: 01/17/2016 12:06   Dg Fluoro Guided Loc Of Needle/cath Tip For Spinal Inject Lt  01/17/2016  CLINICAL DATA:  Encephalitis. EXAM: DIAGNOSTIC LUMBAR PUNCTURE UNDER FLUOROSCOPIC GUIDANCE FLUOROSCOPY TIME:  Radiation Exposure Index (as provided by the fluoroscopic device): 17.0 mGy PROCEDURE: Informed consent was obtained from the patient prior to the procedure, including potential complications of headache, allergy, and pain. With the patient prone, the lower back was prepped with Betadine. 1% Lidocaine was used for local anesthesia. Lumbar puncture was performed at the L4-L5 level using a 22 gauge  needle with return of clear CSF with an opening pressure of 35 cm water. Fourteen ml of CSF were obtained for laboratory studies. The patient tolerated the procedure well and there were no apparent complications. Hemostasis was achieved. IMPRESSION: Successful fluoroscopic guided lumbar puncture . Electronically Signed   By: Marcello Moores  Register   On: 01/17/2016 17:09    Assessment/Plan: Encephalitis- unresponsive and febrile. Findings not explained by his CT finding of CVA.  LP with 0 wbc, some RBC, protein only 51 Pending culture, HSV PCR, enteroviral pcr, arboviral serology (state lab) and rabies testing ([facility])  Suspected aspiration pna - ct shows dense RLL infiltrate. Bronch done 4/26 cx with MSSA -changed meropenem to unasyn Cont vanco for now but if sputum cx neg can dc  Increased LFTS - possible dilantin vs shock liver Ct abd and uss shows no liver gallbladder issues  - repeat in am  Possible rabies exposure - WU has been sent to [facility]- result likely in 4-5 days Cont contact and droplet precaution   Thank you very much for the consult. Will follow with you.  Domnique Vantine P   01/18/2016, 1:58 PM

## 2016-01-18 NOTE — Progress Notes (Signed)
Informed by NP re pt family asking for transfer to [facility]. Currently we have another patient awaiting transfer to [facility] for the past several days, per transfer team there are no beds available, therefore will hold off on transfer request at this time.  Nicholos Johns, M.D.  01/18/2016

## 2016-01-18 NOTE — Progress Notes (Signed)
Increases work of breathing, vecuronium given with improvement.  Will continue to monitor.

## 2016-01-18 NOTE — Progress Notes (Signed)
Patient remains intubated, sedation remains on due to tachypnea earlier in the evening.  Patient has remained afebrile throughout the night and other vital signs have remained stable.  Supplied gathered for skin biopsy this AM.  Refer to flowsheet for complete assessment.  Will continue to monitor.

## 2016-01-18 NOTE — Progress Notes (Signed)
Spoke with Dr. Ileene Hutchinson at [facility].  He is ready to accept the patient. No ICU bed available at this time.   Illya Gienger,AG-ACNP Pulmonary & Critical Care

## 2016-01-18 NOTE — Progress Notes (Signed)
Pharmacy Antibiotic Note  Duane SADLOWSKI Sr. is a 45 y.o. male admitted on 01/12/2016 with encephalitis, r/o meningitis, suspected aspiration PNA, and possible rabies exposure.  Pharmacy has been consulted for Unasyn dosing. Patient previously on meropenem and is currently on vancomycin and doxycycline.   Plan: Will order Unasyn 3 g iv q 6 hours.   SCr continues to improve. Random vancomycin level this am 14 mcg/ml. Will continue vancomycin 1500 mg iv q 24 hours for now and plan on checking a trough at 0630 4/28.     Height: 6\' 1"  (185.4 cm) Weight: 290 lb 12.6 oz (131.9 kg) IBW/kg (Calculated) : 79.9  Temp (24hrs), Avg:100.2 F (37.9 C), Min:97.1 F (36.2 C), Max:103.6 F (39.8 C)   Recent Labs Lab 01/12/16 0141 01/12/16 0551  01/13/16 0601 01/14/16 0455 01/14/16 0610 01/14/16 1643  01/15/16 0559 01/16/16 0237 01/16/16 0514 01/17/16 0451 01/18/16 0524 01/18/16 0526  WBC  --   --   --  13.5* 11.9*  --   --   --   --   --  18.1* 15.7* 14.8*  --   CREATININE  --   --   < > 1.14  --  4.01*  --   --  5.17* 4.33*  --  3.46* 2.92*  --   LATICACIDVEN 5.5* 2.5*  --   --   --   --   --   --   --   --   --   --   --   --   VANCOTROUGH  --   --   --   --   --   --  19*  --   --   --   --   --   --   --   VANCORANDOM  --   --   --   --   --   --   --   < > 30  --  12 14  --  14  < > = values in this interval not displayed.  Estimated Creatinine Clearance: 46 mL/min (by C-G formula based on Cr of 2.92).    No Known Allergies  Antimicrobials this admission: Zosyn 4/21 >> 4/24 Meropenem 4/25 >>4/26 Doxycycline 4/24 >> Unasyn 4/26 >> vancomycin 4/21 >>   Dose adjustments this admission: Vancomycin changed from 1250 mg iv q 8 h to 1500 mg iv q 24 hours   Microbiology results: 4/25 CSF cx: NGTD 4/24 TA: S. aureus 4/20 BCx: negative 4/22 BCx: NGTD 4/24 BCx: NGTD 4/24 UCx: negative 4/22 UCx: negative  4/22 TA: MSSA 4/20 MRSA PCR: positive  Thank you for allowing  pharmacy to be a part of this patient's care.  Valentina Gu 01/18/2016 2:37 PM

## 2016-01-18 NOTE — Progress Notes (Signed)
Nutrition Follow-up  DOCUMENTATION CODES:   Obesity unspecified  INTERVENTION:  -Tube feeding orders remain active; OG in place, abdomen soft, BS present, +BM; no contraindications for reinitiation of TF present. Recommend starting Vital High Protein today, per MD Nicholos Johns, ok to restart post bronch today; goal of 60 ml/hr with Prostat TID  NUTRITION DIAGNOSIS:   Inadequate oral intake related to acute illness as evidenced by NPO status.  Being addressed via TF  GOAL:   Provide needs based on ASPEN/SCCM guidelines  MONITOR:   Vent status, TF tolerance, Labs, Weight trends, I & O's  REASON FOR ASSESSMENT:   Ventilator    ASSESSMENT:    45 yo male admitted with respiratory distress, seizures, NSTEMI; pt intubated on admission 01/12/16 for airway protection. Positive for cannabinoids and benzodiazepines on admission   Pt found to have acute right sided CVA-thalm lacunar infarct; encephalitis- unresponsive and febrile; pt being followed by Neurology and Infectious Disease. Pt s/p LP  And skin biopsy yesterday, possible rabies exposure; saliva, neck skin biopsy and CSF samples sent to [facility]. Pt going from bronch today per MD  TF remains on hold this AM; per Brittney RN, RN received report that TF remains on hold due to pt vomitting several days ago. No documentation of vomiting in the chart  Patient is currently intubated on ventilator support MV: 14.2 L/min Temp (24hrs), Avg:100.6 F (38.1 C), Min:97.1 F (36.2 C), Max:103.6 F (39.8 C)  Diet Order:  Diet NPO time specified Except for: Sips with Meds  Skin:  Reviewed, no issues   Digestive System: abdomen soft/distended, BS active, +BM  Last BM:  01/17/16; +loose black Bm   Labs: potassium 5.5, Creatinine 2.92  Meds: 1/2 NS at 40 ml/hr  Height:   Ht Readings from Last 1 Encounters:  01/12/16 6\' 1"  (1.854 m)    Weight:   Wt Readings from Last 1 Encounters:  01/18/16 290 lb 12.6 oz (131.9 kg)    Ideal Body  Weight:  83.6 kg  BMI:  Body mass index is 38.37 kg/(m^2).  Estimated Nutritional Needs:   Kcal:  1540-0867 kcals using current wt of 126 kg  Protein:  >/= 168 g (2.0 g/kg IBW)  Fluid:  >/=1.7 L  EDUCATION NEEDS:   No education needs identified at this time  Romelle Starcher MS, RD, LDN [phone] Pager  [phone] Weekend/On-Call Pager

## 2016-01-18 NOTE — Progress Notes (Signed)
Subjective:  S Creatinine has improved further.  3.46->2.92 UOP 2925 cc BP control better with iv labetalol  LP done 4/25    Objective:  Vital signs in last 24 hours:  Temp:  [97.1 F (36.2 C)-103.6 F (39.8 C)] 98.6 F (37 C) (04/26 0400) Pulse Rate:  [81-115] 91 (04/26 0800) Resp:  [23-35] 26 (04/26 0800) BP: (111-184)/(77-130) 156/130 mmHg (04/26 0800) SpO2:  [86 %-100 %] 100 % (04/26 0828) FiO2 (%):  [40 %-90 %] 50 % (04/26 0828) Weight:  [131.9 kg (290 lb 12.6 oz)] 131.9 kg (290 lb 12.6 oz) (04/26 0500)  Weight change: 3.1 kg (6 lb 13.4 oz) Filed Weights   01/16/16 0600 01/17/16 0500 01/18/16 0500  Weight: 127.8 kg (281 lb 12 oz) 128.8 kg (283 lb 15.2 oz) 131.9 kg (290 lb 12.6 oz)    Intake/Output:    Intake/Output Summary (Last 24 hours) at 01/18/16 0939 Last data filed at 01/18/16 0800  Gross per 24 hour  Intake 1399.16 ml  Output   2425 ml  Net -1025.84 ml     Physical Exam: General: Critically ill  HEENT Pin point pupils, conjunctival edema, ETT,  OGT  Neck No masses  Pulm/lungs Coarse b/l, vent dependent,    CVS/Heart Regular, tachycardic  Abdomen:  Soft, NT  Extremities: + dependent edema  Neurologic: sedated        Foley catheter       Basic Metabolic Panel:   Recent Labs Lab 01/13/16 0601 01/14/16 0610 01/15/16 0559 01/16/16 0237 01/16/16 0514 01/17/16 0451 01/18/16 0524  NA 143 142  --  142  --  144 142  K 3.8 4.5  --  4.2  --  4.8 5.5*  CL 114* 110  --  115*  --  116* 115*  CO2 24 23  --  19*  --  23 22  GLUCOSE 162* 170*  --  277*  --  137* 113*  BUN 17 39*  --  70*  --  71* 68*  CREATININE 1.14 4.01* 5.17* 4.33*  --  3.46* 2.92*  CALCIUM 8.8* 8.2*  --  7.8*  --  8.2* 8.3*  MG  --   --   --   --  2.2  --   --   PHOS  --   --   --   --  2.3*  --   --      CBC:  Recent Labs Lab 01/12/16 0134 01/13/16 0601 01/14/16 0455 01/16/16 0514 01/17/16 0451 01/18/16 0524  WBC 12.4* 13.5* 11.9* 18.1* 15.7* 14.8*  NEUTROABS  5.1  --   --  16.0*  --   --   HGB 15.2 13.0 13.2 11.6* 10.6* 10.2*  HCT 45.3 39.2* 39.6* 35.2* 32.3* 30.8*  MCV 91.1 92.2 92.6 92.3 94.2 93.3  PLT 317 213 75* 52* 70* 94*      Microbiology:  Recent Results (from the past 720 hour(s))  MRSA PCR Screening     Status: Abnormal   Collection Time: 01/12/16  6:01 AM  Result Value Ref Range Status   MRSA by PCR POSITIVE (A) NEGATIVE Final    Comment:        The GeneXpert MRSA Assay (FDA approved for NASAL specimens only), is one component of a comprehensive MRSA colonization surveillance program. It is not intended to diagnose MRSA infection nor to guide or monitor treatment for MRSA infections. CRITICAL RESULT CALLED TO, READ BACK BY AND VERIFIED WITH: PAM MYERS 01/12/16 0751 SGD  Culture, blood (Routine X 2) w Reflex to ID Panel     Status: None   Collection Time: 01/12/16  4:54 PM  Result Value Ref Range Status   Specimen Description BLOOD LEFT HAND  Final   Special Requests BOTTLES DRAWN AEROBIC AND ANAEROBIC [place]  Final   Culture NO GROWTH 5 DAYS  Final   Report Status 01/17/2016 FINAL  Final  Culture, blood (Routine X 2) w Reflex to ID Panel     Status: None   Collection Time: 01/12/16  5:04 PM  Result Value Ref Range Status   Specimen Description BLOOD LEFT ARM  Final   Special Requests   Final    BOTTLES DRAWN AEROBIC AND ANAEROBIC  AERO 10CC ANA 5CC   Culture NO GROWTH 5 DAYS  Final   Report Status 01/17/2016 FINAL  Final  Culture, expectorated sputum-assessment     Status: None   Collection Time: 01/14/16  4:26 AM  Result Value Ref Range Status   Specimen Description TRACHEAL ASPIRATE  Final   Special Requests NONE  Final   Sputum evaluation THIS SPECIMEN IS ACCEPTABLE FOR SPUTUM CULTURE  Final   Report Status 01/14/2016 FINAL  Final  Culture, respiratory (NON-Expectorated)     Status: None   Collection Time: 01/14/16  4:26 AM  Result Value Ref Range Status   Specimen Description TRACHEAL ASPIRATE   Final   Special Requests NONE Reflexed from [id number]  Final   Gram Stain FEW WBC SEEN FEW GRAM POSITIVE COCCI   Final   Culture MODERATE GROWTH STAPHYLOCOCCUS AUREUS  Final   Report Status 01/16/2016 FINAL  Final   Organism ID, Bacteria STAPHYLOCOCCUS AUREUS  Final      Susceptibility   Staphylococcus aureus - MIC*    CIPROFLOXACIN <=0.5 SENSITIVE Sensitive     ERYTHROMYCIN 2 RESISTANT Resistant     GENTAMICIN <=0.5 SENSITIVE Sensitive     OXACILLIN 0.5 SENSITIVE Sensitive     TETRACYCLINE <=1 SENSITIVE Sensitive     VANCOMYCIN 1 SENSITIVE Sensitive     TRIMETH/SULFA <=10 SENSITIVE Sensitive     CLINDAMYCIN <=0.25 RESISTANT Resistant     RIFAMPIN <=0.5 SENSITIVE Sensitive     Inducible Clindamycin Value in next row Resistant      POSITIVEINDUCIBLE CLINDAMYCIN RESISTANCE - A positive ICR test is indicative of inducible resistance to macrolides, lincosamides, and type B streptogramin.  This isolate is presumed to be resistant to Clindamycin, however, Clindamycin may still be effective in some patients.     * MODERATE GROWTH STAPHYLOCOCCUS AUREUS  Culture, blood (Routine X 2) w Reflex to ID Panel     Status: None (Preliminary result)   Collection Time: 01/14/16  4:46 AM  Result Value Ref Range Status   Specimen Description BLOOD LEFT ASSIST CONTROL  Final   Special Requests   Final    BOTTLES DRAWN AEROBIC AND ANAEROBIC 15CCAERO,12CCANA   Culture NO GROWTH 4 DAYS  Final   Report Status PENDING  Incomplete  Culture, blood (Routine X 2) w Reflex to ID Panel     Status: None (Preliminary result)   Collection Time: 01/14/16  5:06 AM  Result Value Ref Range Status   Specimen Description BLOOD RIGHT ASSIST CONTROL  Final   Special Requests   Final    BOTTLES DRAWN AEROBIC AND ANAEROBIC 12CCAERO,12CCANA   Culture NO GROWTH 3 DAYS  Final   Report Status PENDING  Incomplete  Urine culture     Status: None   Collection Time: 01/14/16  6:28 AM  Result Value Ref Range Status   Specimen  Description URINE, RANDOM  Final   Special Requests NONE  Final   Culture NO GROWTH 1 DAY  Final   Report Status 01/15/2016 FINAL  Final  Culture, blood (Routine X 2) w Reflex to ID Panel     Status: None (Preliminary result)   Collection Time: 01/16/16  8:03 AM  Result Value Ref Range Status   Specimen Description BLOOD RIGHT HAND  Final   Special Requests   Final    BOTTLES DRAWN AEROBIC AND ANAEROBIC AER 1ML ANA .5ML   Culture NO GROWTH 1 DAY  Final   Report Status PENDING  Incomplete  Culture, blood (Routine X 2) w Reflex to ID Panel     Status: None (Preliminary result)   Collection Time: 01/16/16  8:04 AM  Result Value Ref Range Status   Specimen Description BLOOD LEFT HAND  Final   Special Requests   Final    BOTTLES DRAWN AEROBIC AND ANAEROBIC AER 1ML ANA .5ML   Culture NO GROWTH 1 DAY  Final   Report Status PENDING  Incomplete  Urine culture     Status: None   Collection Time: 01/16/16  8:58 AM  Result Value Ref Range Status   Specimen Description URINE, RANDOM  Final   Special Requests NONE  Final   Culture NO GROWTH 1 DAY  Final   Report Status 01/17/2016 FINAL  Final  Culture, expectorated sputum-assessment     Status: None   Collection Time: 01/16/16 12:10 PM  Result Value Ref Range Status   Specimen Description TRACHEAL ASPIRATE  Final   Special Requests NONE  Final   Sputum evaluation THIS SPECIMEN IS ACCEPTABLE FOR SPUTUM CULTURE  Final   Report Status 01/16/2016 FINAL  Final  Culture, respiratory (NON-Expectorated)     Status: None (Preliminary result)   Collection Time: 01/16/16 12:10 PM  Result Value Ref Range Status   Specimen Description TRACHEAL ASPIRATE  Final   Special Requests NONE Reflexed from [id number]  Final   Gram Stain RARE WBC SEEN NO ORGANISMS SEEN   Final   Culture HOLDING FOR POSSIBLE PATHOGEN  Final   Report Status PENDING  Incomplete  CSF culture     Status: None (Preliminary result)   Collection Time: 01/17/16  4:22 PM  Result Value Ref  Range Status   Specimen Description CSF  Final   Special Requests NONE  Final   Gram Stain NO ORGANISMS SEEN RARE WBC SEEN   Final   Culture NO GROWTH < 24 HOURS  Final   Report Status PENDING  Incomplete    Coagulation Studies:  Recent Labs  01/17/16 1036  LABPROT 15.7*  INR 1.23    Urinalysis: No results for input(s): COLORURINE, LABSPEC, PHURINE, GLUCOSEU, HGBUR, BILIRUBINUR, KETONESUR, PROTEINUR, UROBILINOGEN, NITRITE, LEUKOCYTESUR in the last 72 hours.  Invalid input(s): APPERANCEUR    Imaging: Ct Abdomen Pelvis Wo Contrast  01/16/2016  CLINICAL DATA:  Fevers of uncertain source.  Renal insufficiency. EXAM: CT CHEST, ABDOMEN AND PELVIS WITHOUT CONTRAST TECHNIQUE: Multidetector CT imaging of the chest, abdomen and pelvis was performed following the standard protocol without IV contrast. COMPARISON:  Portable chest obtained earlier today. Chest, abdomen and pelvis CT dated 05/02/2010. FINDINGS: CT CHEST FINDINGS Mediastinum/Lymph Nodes: Endotracheal tube tip above the carina. Nasogastric or oral gastric tube extending into the stomach. Mild atheromatous coronary artery calcifications. No enlarged lymph nodes. Lungs/Pleura: Bilateral lower lobe atelectasis. Extensive patchy opacity in the right  lower lobe. Mild patchy opacity in the adjacent inferior aspect of the right upper lobe. Small amount of atelectasis in the posterior, inferior aspect of the left upper lobe. No lung nodules or pleural fluid. Musculoskeletal: Mild thoracic spine degenerative changes. CT ABDOMEN PELVIS FINDINGS Hepatobiliary: Unremarkable liver and gallbladder. Pancreas: No mass or inflammatory process identified on this un-enhanced exam. Spleen: Within normal limits in size and appearance. Adrenals/Urinary Tract: Foley catheter in the urinary bladder with associated air in the bladder. No urinary tract calculi or hydronephrosis. Normal appearing adrenal glands. Stomach/Bowel: Orogastric or nasogastric tube tip in  the proximal stomach. No dilated bowel loops or evidence of appendicitis. The appendix is retrocecal in location and has a normal appearance. Vascular/Lymphatic: Mild atheromatous arterial calcifications. No aneurysm or enlarged lymph nodes. Reproductive: The left testis is in the inferior aspect of the left inguinal canal. Normal sized prostate gland. Other: Small proximal left inguinal hernia containing fat. Musculoskeletal:  Mild lumbar spine degenerative changes. IMPRESSION: 1. Extensive right lower lobe pneumonia and small amount of pneumonia in the adjacent right upper lobe. 2. Bilateral lower lobe atelectasis. 3. Incompletely descended or retracted left testis in the inferior aspect of the left inguinal canal. 4. Small proximal left inguinal hernia containing fat. 5. Mild atheromatous coronary artery calcifications. Electronically Signed   By: Claudie Revering M.D.   On: 01/16/2016 19:17   Dg Chest 1 View  01/18/2016  CLINICAL DATA:  Shortness of breath. EXAM: CHEST 1 VIEW COMPARISON:  01/17/2016. FINDINGS: Endotracheal tube NG tube, and right IJ line stable position. Stable cardiomegaly. Interim partial clearing of right lower lobe infiltrate and/or pulmonary edema. Small residual right pleural effusion. Left lower lobe infiltrate/edema has cleared. Left pleural effusion has cleared. These findings are consistent with improving congestive heart failure. IMPRESSION: 1.  Lines and tubes in stable position. 2. Stable cardiomegaly. Interim partial clearing of right lower lobe infiltrate/pulmonary edema. Near complete clearing of right pleural effusion. Left lower lobe infiltrate and/or pulmonary edema and left pleural effusion has cleared. These findings are consistent with improving congestive heart failure. Electronically Signed   By: Marcello Moores  Register   On: 01/18/2016 07:26   Dg Chest 1 View  01/17/2016  CLINICAL DATA:  Respiratory failure, unresponsive. EXAM: CHEST 1 VIEW COMPARISON:  January 17, 2016  FINDINGS: The heart size is mildly enlarged. Endotracheal tube is identified in good position unchanged compared to prior exam. The endotracheal tube tip is 5 cm from carina. A right central venous line is unchanged. There are patchy opacities of bilateral lung bases. The bones are normal. IMPRESSION: Patchy opacity of bilateral lung bases at least in part due to atelectasis but superimposed pneumonia is not excluded. Bilateral pleural effusions. Electronically Signed   By: Abelardo Diesel M.D.   On: 01/17/2016 13:54   Dg Chest 1 View  01/17/2016  CLINICAL DATA:  Shortness of breath. EXAM: CHEST 1 VIEW COMPARISON:  CT 01/16/2016.  Chest x-ray 01/16/2016 . FINDINGS: Endotracheal tube and NG tube in stable position. Right IJ line in stable position. Cardiomegaly. Interim significant partial clearing of basilar infiltrates/edema with mild residual, particular in the right lung base. Small right pleural effusion. No pneumothorax. IMPRESSION: 1. Lines and tubes in stable position. 2. Interim significant partial clearing of bilateral basilar infiltrates/edema. Persistent small right pleural effusion. 3. Stable cardiomegaly . Electronically Signed   By: Marcello Moores  Register   On: 01/17/2016 07:24   Ct Head Wo Contrast  01/16/2016  CLINICAL DATA:  Followup acute right thalamic stroke. EXAM:  CT HEAD WITHOUT CONTRAST TECHNIQUE: Contiguous axial images were obtained from the base of the skull through the vertex without intravenous contrast. COMPARISON:  01/14/2016. FINDINGS: The previously demonstrated oval area of low density in the upper right thalamus is smaller and less distinct today. No intracranial hemorrhage or mass effect. Normal size and position of the ventricles. No new abnormalities. Almost complete opacification of the left maxillary and sphenoid sinuses is again demonstrated with left frontal, left ethmoid and right sphenoid sinus mucosal thickening. IMPRESSION: 1. No acute abnormality. 2. Interval decrease in  size and conspicuity of a right thalamic infarct. 3. Extensive chronic sinusitis. Electronically Signed   By: Claudie Revering M.D.   On: 01/16/2016 18:34   Ct Chest Wo Contrast  01/16/2016  CLINICAL DATA:  Fevers of uncertain source.  Renal insufficiency. EXAM: CT CHEST, ABDOMEN AND PELVIS WITHOUT CONTRAST TECHNIQUE: Multidetector CT imaging of the chest, abdomen and pelvis was performed following the standard protocol without IV contrast. COMPARISON:  Portable chest obtained earlier today. Chest, abdomen and pelvis CT dated 05/02/2010. FINDINGS: CT CHEST FINDINGS Mediastinum/Lymph Nodes: Endotracheal tube tip above the carina. Nasogastric or oral gastric tube extending into the stomach. Mild atheromatous coronary artery calcifications. No enlarged lymph nodes. Lungs/Pleura: Bilateral lower lobe atelectasis. Extensive patchy opacity in the right lower lobe. Mild patchy opacity in the adjacent inferior aspect of the right upper lobe. Small amount of atelectasis in the posterior, inferior aspect of the left upper lobe. No lung nodules or pleural fluid. Musculoskeletal: Mild thoracic spine degenerative changes. CT ABDOMEN PELVIS FINDINGS Hepatobiliary: Unremarkable liver and gallbladder. Pancreas: No mass or inflammatory process identified on this un-enhanced exam. Spleen: Within normal limits in size and appearance. Adrenals/Urinary Tract: Foley catheter in the urinary bladder with associated air in the bladder. No urinary tract calculi or hydronephrosis. Normal appearing adrenal glands. Stomach/Bowel: Orogastric or nasogastric tube tip in the proximal stomach. No dilated bowel loops or evidence of appendicitis. The appendix is retrocecal in location and has a normal appearance. Vascular/Lymphatic: Mild atheromatous arterial calcifications. No aneurysm or enlarged lymph nodes. Reproductive: The left testis is in the inferior aspect of the left inguinal canal. Normal sized prostate gland. Other: Small proximal left  inguinal hernia containing fat. Musculoskeletal:  Mild lumbar spine degenerative changes. IMPRESSION: 1. Extensive right lower lobe pneumonia and small amount of pneumonia in the adjacent right upper lobe. 2. Bilateral lower lobe atelectasis. 3. Incompletely descended or retracted left testis in the inferior aspect of the left inguinal canal. 4. Small proximal left inguinal hernia containing fat. 5. Mild atheromatous coronary artery calcifications. Electronically Signed   By: Claudie Revering M.D.   On: 01/16/2016 19:17   US Renal  01/16/2016  CLINICAL DATA:  Acute renal failure EXAM: RENAL / URINARY TRACT ULTRASOUND COMPLETE COMPARISON:  CT 05/02/2010 FINDINGS: Right Kidney: Length: 11.8 cm. Parenchyma echogenic compared to the adjacent liver. No mass or hydronephrosis visualized. Left Kidney: Length: 12 cm. Echogenicity within normal limits. No mass or hydronephrosis visualized. Bladder: Decompressed by catheter. IMPRESSION: 1. Negative for hydronephrosis. 2. Echogenic renal parenchyma, a nonspecific indicator of medical renal disease. Electronically Signed   By: Lucrezia Europe M.D.   On: 01/16/2016 15:44   US Abdomen Limited Ruq  01/17/2016  CLINICAL DATA:  Sepsis.  Ventilated patient. EXAM: US ABDOMEN LIMITED - RIGHT UPPER QUADRANT COMPARISON:  CT 01/16/2016 and previous FINDINGS: Gallbladder: No gallstones or wall thickening visualized. No sonographic Murphy sign noted by sonographer. Only supine images could be obtained. Common bile duct:  Diameter: 4.1 mm Liver: No focal lesion identified. Within normal limits in parenchymal echogenicity. IMPRESSION: Negative.  Normal gallbladder. Electronically Signed   By: Lucrezia Europe M.D.   On: 01/17/2016 12:06   Dg Fluoro Guided Loc Of Needle/cath Tip For Spinal Inject Lt  01/17/2016  CLINICAL DATA:  Encephalitis. EXAM: DIAGNOSTIC LUMBAR PUNCTURE UNDER FLUOROSCOPIC GUIDANCE FLUOROSCOPY TIME:  Radiation Exposure Index (as provided by the fluoroscopic device): 17.0 mGy  PROCEDURE: Informed consent was obtained from the patient prior to the procedure, including potential complications of headache, allergy, and pain. With the patient prone, the lower back was prepped with Betadine. 1% Lidocaine was used for local anesthesia. Lumbar puncture was performed at the L4-L5 level using a 22 gauge needle with return of clear CSF with an opening pressure of 35 cm water. Fourteen ml of CSF were obtained for laboratory studies. The patient tolerated the procedure well and there were no apparent complications. Hemostasis was achieved. IMPRESSION: Successful fluoroscopic guided lumbar puncture . Electronically Signed   By: Marcello Moores  Register   On: 01/17/2016 17:09     Medications:   . sodium chloride 40 mL/hr at 01/17/16 1900  . feeding supplement (VITAL HIGH PROTEIN) Stopped (01/16/16 0700)  . fentaNYL infusion INTRAVENOUS 125 mcg/hr (01/17/16 1856)  . midazolam (VERSED) infusion 2 mg/hr (01/17/16 2130)  . niCARDipine Stopped (01/16/16 1227)   . sodium chloride   Intravenous Once  . ampicillin-sulbactam (UNASYN) IV  3 g Intravenous Q6H  . antiseptic oral rinse  7 mL Mouth Rinse QID  . aspirin EC  81 mg Oral Daily  . chlorhexidine gluconate (SAGE KIT)  15 mL Mouth Rinse BID  . cloNIDine  0.1 mg Transdermal Weekly  . diazepam  5 mg Per NG tube Q6H  . docusate  100 mg Per Tube BID  . doxycycline (VIBRAMYCIN) IV  100 mg Intravenous Q12H  . feeding supplement (PRO-STAT SUGAR FREE 64)  30 mL Per Tube BID  . free water  100 mL Per Tube Q8H  . labetalol  20 mg Intravenous Q6H  . lacosamide (VIMPAT) IV  100 mg Intravenous Q12H  . pantoprazole (PROTONIX) IV  40 mg Intravenous Q24H  . polyethylene glycol  17 g Per Tube Daily  . sennosides  5 mL Oral BID  . vancomycin  1,500 mg Intravenous Q24H   acetaminophen **OR** acetaminophen, fentaNYL, hydrALAZINE, ibuprofen, midazolam, midazolam, ondansetron **OR** ondansetron (ZOFRAN) IV, vecuronium  Assessment/ Plan:  45 y.o. male  with a PMHX of severe uncontrolled HTN, NSTEMI (2014) with occluded RCA and LCx, no PCI, previous h/o cocaine abuse , was admitted on 01/12/2016 with unresponsiveness and is dx with stroke.   1. ARF, non oliguric. Baseline Cr 1.14. DDx ATN, NSAIDs (ibuprofen), Malignant HTN - UOP 2925 cc - S Cr peaked at 5.17, but has now started to improve (3.46)->2.92 Electrolytes and Volume status are acceptable No acute indication for Dialysis at present   2. Malignant HTN UDS positive for marijuana and benzodiazepene - neg for cocaine this time  better controlled with iv labetalol  3. Thrombocytopenia DDx includes Malignant HTN, Phenytoin  Improving  4. Proteinuria - ANA negative - urine P/C ratio 1.14  5. Encephalitis - RBCs in CSF - work up in progress   LOS: 6 Rakeisha Nyce 4/26/20179:39 AM

## 2016-01-18 NOTE — Progress Notes (Signed)
Work of breathing and o2 sats improved after vecuronium was given. Continuing to monitor.

## 2016-01-18 NOTE — Progress Notes (Signed)
Pharmacy Note - Constipation Prevention  Patient with BM 4/25. Patient is currently on the following bowel regimen:  Docusate 100 mg per tube BID Senna 8.6 mg BID Miralax daily  Will change Miralax to daily prn.   Valentina Gu, PharmD 2:52 PM

## 2016-01-19 DIAGNOSIS — G0490 Encephalitis and encephalomyelitis, unspecified: Secondary | ICD-10-CM

## 2016-01-19 LAB — COMPREHENSIVE METABOLIC PANEL
ALT: 373 U/L — ABNORMAL HIGH (ref 17–63)
AST: 70 U/L — ABNORMAL HIGH (ref 15–41)
Albumin: 2.2 g/dL — ABNORMAL LOW (ref 3.5–5.0)
Alkaline Phosphatase: 123 U/L (ref 38–126)
Anion gap: 7 (ref 5–15)
BILIRUBIN TOTAL: 0.6 mg/dL (ref 0.3–1.2)
BUN: 67 mg/dL — AB (ref 6–20)
CALCIUM: 8.4 mg/dL — AB (ref 8.9–10.3)
CO2: 21 mmol/L — ABNORMAL LOW (ref 22–32)
Chloride: 118 mmol/L — ABNORMAL HIGH (ref 101–111)
Creatinine, Ser: 2.68 mg/dL — ABNORMAL HIGH (ref 0.61–1.24)
GFR calc non Af Amer: 27 mL/min — ABNORMAL LOW (ref >60)
GFR, EST AFRICAN AMERICAN: 32 mL/min — AB (ref >60)
GLUCOSE: 197 mg/dL — AB (ref 65–99)
Potassium: 5.7 mmol/L — ABNORMAL HIGH (ref 3.5–5.1)
Sodium: 146 mmol/L — ABNORMAL HIGH (ref 135–145)
TOTAL PROTEIN: 6.3 g/dL — AB (ref 6.5–8.1)

## 2016-01-19 LAB — CBC WITH DIFFERENTIAL/PLATELET
Basophils Absolute: 0 K/uL (ref 0–0.1)
Basophils Relative: 0 %
Eosinophils Absolute: 0.2 K/uL (ref 0–0.7)
Eosinophils Relative: 1 %
HCT: 33.6 % — ABNORMAL LOW (ref 40.0–52.0)
Hemoglobin: 10.4 g/dL — ABNORMAL LOW (ref 13.0–18.0)
Lymphocytes Relative: 7 %
Lymphs Abs: 1.3 K/uL (ref 1.0–3.6)
MCH: 30.4 pg (ref 26.0–34.0)
MCHC: 30.9 g/dL — ABNORMAL LOW (ref 32.0–36.0)
MCV: 98.1 fL (ref 80.0–100.0)
Monocytes Absolute: 1.9 K/uL — ABNORMAL HIGH (ref 0.2–1.0)
Monocytes Relative: 10 %
Neutro Abs: 15.8 K/uL — ABNORMAL HIGH (ref 1.4–6.5)
Neutrophils Relative %: 82 %
Platelets: 148 K/uL — ABNORMAL LOW (ref 150–440)
RBC: 3.42 MIL/uL — ABNORMAL LOW (ref 4.40–5.90)
RDW: 15.7 % — ABNORMAL HIGH (ref 11.5–14.5)
WBC: 19.2 K/uL — ABNORMAL HIGH (ref 3.8–10.6)

## 2016-01-19 LAB — CULTURE, BLOOD (ROUTINE X 2)
Culture: NO GROWTH
Culture: NO GROWTH

## 2016-01-19 LAB — CREATININE, SERUM
Creatinine, Ser: 2.96 mg/dL — ABNORMAL HIGH (ref 0.61–1.24)
GFR, EST AFRICAN AMERICAN: 28 mL/min — AB (ref >60)
GFR, EST NON AFRICAN AMERICAN: 24 mL/min — AB (ref >60)

## 2016-01-19 LAB — CRYPTOCOCCAL ANTIGEN, CSF: Crypto Ag: NEGATIVE

## 2016-01-19 LAB — GLUCOSE, CAPILLARY
Glucose-Capillary: 110 mg/dL — ABNORMAL HIGH (ref 65–99)
Glucose-Capillary: 138 mg/dL — ABNORMAL HIGH (ref 65–99)
Glucose-Capillary: 150 mg/dL — ABNORMAL HIGH (ref 65–99)
Glucose-Capillary: 186 mg/dL — ABNORMAL HIGH (ref 65–99)

## 2016-01-19 LAB — CULTURE, RESPIRATORY W GRAM STAIN

## 2016-01-19 LAB — POTASSIUM: Potassium: 4.9 mmol/L (ref 3.5–5.1)

## 2016-01-19 LAB — HERPES SIMPLEX VIRUS(HSV) DNA BY PCR
HSV 1 DNA: NEGATIVE
HSV 2 DNA: NEGATIVE

## 2016-01-19 LAB — TRIGLYCERIDES: TRIGLYCERIDES: 282 mg/dL — AB (ref <150)

## 2016-01-19 MED ORDER — DEXTROSE 5 % IV SOLN
5.0000 mg/kg | Freq: Once | INTRAVENOUS | Status: AC
  Administered 2016-01-19: 500 mg via INTRAVENOUS
  Filled 2016-01-19: qty 12.5

## 2016-01-19 MED ORDER — STERILE WATER FOR INJECTION IJ SOLN
INTRAMUSCULAR | Status: AC
  Administered 2016-01-19: 17:00:00
  Filled 2016-01-19: qty 10

## 2016-01-19 MED ORDER — SODIUM POLYSTYRENE SULFONATE 15 GM/60ML PO SUSP
15.0000 g | Freq: Once | ORAL | Status: AC
  Administered 2016-01-19: 15 g
  Filled 2016-01-19: qty 60

## 2016-01-19 MED ORDER — PROPOFOL 1000 MG/100ML IV EMUL
5.0000 ug/kg/min | INTRAVENOUS | Status: DC
  Administered 2016-01-19 (×2): 10 ug/kg/min via INTRAVENOUS
  Filled 2016-01-19 (×2): qty 100

## 2016-01-19 MED ORDER — CIPROFLOXACIN IN D5W 400 MG/200ML IV SOLN
400.0000 mg | Freq: Two times a day (BID) | INTRAVENOUS | Status: DC
  Administered 2016-01-19: 400 mg via INTRAVENOUS
  Filled 2016-01-19 (×3): qty 200

## 2016-01-19 MED ORDER — STERILE WATER FOR INJECTION IJ SOLN
INTRAMUSCULAR | Status: AC
  Administered 2016-01-19: 10 mL
  Filled 2016-01-19: qty 10

## 2016-01-19 MED ORDER — DEXTROSE 5 % IV SOLN
1000.0000 mg | Freq: Two times a day (BID) | INTRAVENOUS | Status: DC
  Administered 2016-01-19: 1000 mg via INTRAVENOUS
  Filled 2016-01-19 (×3): qty 20

## 2016-01-19 NOTE — Care Management (Signed)
Patient is currently on transfer list for [facility].  There was an available bed 4/26 but then bed had to be given to another patient.  Patient is in need of MRI which can not be performed at [facility]. It is ho[ped that bed can be secured at [facility] within the next 24 hours.  May have to consider alternate tertiary facility.

## 2016-01-19 NOTE — Final Progress Note (Signed)
See discharge summary

## 2016-01-19 NOTE — Progress Notes (Signed)
[facility] Critical Care Medicine Progess Note    ASSESSMENT/PLAN   45 year old male past medical history of hypertension, coronary artery disease, admitted for unresponsiveness and respiratory failure Found to have acute RT sided CVA-thalamic stroke requiring full vent support. Course, complicated by persistent fevers, source uncertain.  D/w neuro, ID re: need for MRI. Pt currently has been accepted for transfer to [facility], we are waiting on a bed, therefore we will defer transfer to [facility] at this time as it would not appear that the need for MRI would be urgent at this time. In addition, this would avoid having to successively undergo facility transfers.. However we can revisit this if the transfer should become excessively delayed.   PULMONARY Respiratory failure-unresponsive VDRF Need for MV -Seizure Lactic acidosis R Thalamic Lacunar infarct Chest x-ray 4/25 review, improved right atelectasis versus infiltrate noted. CT chest reviewed; RLL pneumonia and atelectasis.   P:  - cont with MV, wean as tolerated - maintain O2 sats >88% - SBT/SAT as tolerated - prn ABG - prn Bronchodilators - fentanyl/versed  -Repeat cultures pending  CARDIOVASCULAR CVL RIJ A:  CAD HTN Hx of MI Hx of Coronary Angioplasty with stent placement-hemodynamically stable Possible NSTEMI Now with elevated blood pressure P:  -monitor BP - RIJ with guide wire in place - s\p guide wire removal by vascular surgery, and placement of new RIJ CVL - maintain SBP<140 - PRN labetalol/hydralazine, may need to start on the nicardipine infusion. If blood pressures remain elevated. - cardiology following, mild troponin elevation, heparin gtt stopped  RENAL Elevated Creatinine- improving P:  - BMP as needed - avoid nephrotoxic drugs - possibly due to chronic HTN  - Ultrasound of the kidneys negative for hydronephrosis  GASTROINTESTINAL Etoh Use Marijuana Use - PPI - tube feeds. Elevated  transaminases, possibly due to sepsis  HEMATOLOGIC -CBC, leukocytosis, suspect due to sepsis.   INFECTIOUS A: high fevers P:  - f\u cultures - EMPIRIC ABX     Micro/culture results: BC  4/20-22-24>> negative UC 4/22&24>> negative thus far Sputum 4/22>> gram-positive cocci, further results pending. MRSA POSITIVE  Antibiotics: Antimicrobials this admission: Vancomycin 4/21 >>  Zosyn 4/21 >>  unysyn 01/18/16>> Ciprofloxacin 4/27>>  ENDOCRINE Blood sugar checks intermittently  -NEUROLOGIC A:  Unresponsiveness Possible Seizure episode.  R Thalamic Lacunar Infarct P:  RASS goal: -1,0 - neuro following - seizure precautions.  - AEDs (Dilantin) - BP control  Lumbar puncture results pending   Best Practice: Code Status: Full. Diet: NPO GI prophylaxis: PPI. VTE prophylaxis: SCD's / F/U platelets, if normal Schedule SQ heaprin   MAJOR EVENTS/TEST RESULTS: EEG 01/13/16 IMPRESSION: This is an abnormal electroencephalogram due to general background slowing. Findings are consistent with current medications usage. No epileptiform activity is noted.   Echocardiogram 01/08/16 EF 55%, no other significant abnormalities noted.      ---------------------------------------   ----------------------------------------   Name: Duane Ada Sr. MRN: [id number] DOB: Feb 15, 1971    ADMISSION DATE:  01/12/2016    SUBJECTIVE Patient remains obtunded, patient has not made any improvements since he has been here. Patient is intubated and sedated , was febrile, started on cooling blanket.  Wife was present at the bedside and was updated regarding the patient's status, wife wants him to be transferred to [facility] due to family pressure, she also states that she is happy with the care he is getting here.  . Temp:  [100.3 F (37.9 C)-103.3 F (39.6 C)] 100.3 F (37.9 C) (04/27 0945) Pulse Rate:  [83-110] 90 (  04/27 1215) Resp:  [16-37] 27 (04/27 1215) BP:  (120-179)/(71-101) 135/71 mmHg (04/27 1215) SpO2:  [74 %-100 %] 100 % (04/27 1215) FiO2 (%):  [40 %-50 %] 40 % (04/27 1118) Weight:  [131.9 kg (290 lb 12.6 oz)] 131.9 kg (290 lb 12.6 oz) (04/27 0435) HEMODYNAMICS:   VENTILATOR SETTINGS: Vent Mode:  [-] PRVC FiO2 (%):  [40 %-50 %] 40 % Set Rate:  [26 bmp] 26 bmp Vt Set:  [550 mL] 550 mL PEEP:  [8 cmH20-10 cmH20] 8 cmH20 INTAKE / OUTPUT:  Intake/Output Summary (Last 24 hours) at 01/19/16 1249 Last data filed at 01/19/16 1218  Gross per 24 hour  Intake 5264.48 ml  Output   3000 ml  Net 2264.48 ml    PHYSICAL EXAMINATION: Physical Examination:   VS: BP 135/71 mmHg  Pulse 90  Temp(Src) 100.3 F (37.9 C) (Rectal)  Resp 27  Ht 6\' 1"  (1.854 m)  Wt 131.9 kg (290 lb 12.6 oz)  BMI 38.37 kg/m2  SpO2 100%  General Appearance: patient remains obtunded  Neuro:without focal findings, mental status unresponsive.  HEENT: Atraumatic, normocephalic, no JVD appreciated, lots of oral secretion noted  Pulmonary: normal breath sounds  Decreased bilaterally, no wheezes ,crackles or rhonchi noted CardiovascularNormal S1,S2.  No m/r/g.   Abdomen: Benign, Soft, non-tender. Renal:  No costovertebral tenderness  GU:  Not performed at this time. Endocrine: No evident thyromegaly. Skin:   warm, no rashes, no ecchymosis  Extremities: normal, no cyanosis, clubbing.      LABORATORY PANEL:   CBC  Recent Labs Lab 01/18/16 0524  WBC 14.8*  HGB 10.2*  HCT 30.8*  PLT 94*    Chemistries   Recent Labs Lab 01/16/16 0514 01/17/16 0451 01/18/16 0524 01/19/16 0423  NA  --  144 142  --   K  --  4.8 5.5*  --   CL  --  116* 115*  --   CO2  --  23 22  --   GLUCOSE  --  137* 113*  --   BUN  --  71* 68*  --   CREATININE  --  3.46* 2.92* 2.96*  CALCIUM  --  8.2* 8.3*  --   MG 2.2  --   --   --   PHOS 2.3*  --   --   --   AST 407* 176*  --   --   ALT 1258* 891*  --   --   ALKPHOS 60 59  --   --   BILITOT 0.8 0.6  --   --       Recent Labs Lab 01/18/16 1130 01/18/16 1615 01/18/16 1955 01/19/16 0003 01/19/16 0348 01/19/16 0718  GLUCAP 164* 154* 150* 138* 150* 186*    Recent Labs Lab 01/14/16 2114 01/17/16 0500 01/18/16 0524  PHART 7.29* 7.30* 7.25*  PCO2ART 44 46 50*  PO2ART 402* 159* 242*    Recent Labs Lab 01/13/16 0601 01/16/16 0514 01/17/16 0451  AST 25 407* 176*  ALT 20 1258* 891*  ALKPHOS 61 60 59  BILITOT 0.7 0.8 0.6  ALBUMIN 3.4* 2.3* 2.2*    Cardiac Enzymes  Recent Labs Lab 01/12/16 1814  TROPONINI 0.10*    RADIOLOGY:  Dg Chest 1 View  01/18/2016  CLINICAL DATA:  Shortness of breath. EXAM: CHEST 1 VIEW COMPARISON:  01/17/2016. FINDINGS: Endotracheal tube NG tube, and right IJ line stable position. Stable cardiomegaly. Interim partial clearing of right lower lobe infiltrate and/or pulmonary edema. Small residual right pleural  effusion. Left lower lobe infiltrate/edema has cleared. Left pleural effusion has cleared. These findings are consistent with improving congestive heart failure. IMPRESSION: 1.  Lines and tubes in stable position. 2. Stable cardiomegaly. Interim partial clearing of right lower lobe infiltrate/pulmonary edema. Near complete clearing of right pleural effusion. Left lower lobe infiltrate and/or pulmonary edema and left pleural effusion has cleared. These findings are consistent with improving congestive heart failure. Electronically Signed   By: Maisie Fus  Register   On: 01/18/2016 07:26   Dg Chest 1 View  01/17/2016  CLINICAL DATA:  Respiratory failure, unresponsive. EXAM: CHEST 1 VIEW COMPARISON:  January 17, 2016 FINDINGS: The heart size is mildly enlarged. Endotracheal tube is identified in good position unchanged compared to prior exam. The endotracheal tube tip is 5 cm from carina. A right central venous line is unchanged. There are patchy opacities of bilateral lung bases. The bones are normal. IMPRESSION: Patchy opacity of bilateral lung bases at least in  part due to atelectasis but superimposed pneumonia is not excluded. Bilateral pleural effusions. Electronically Signed   By: Sherian Rein M.D.   On: 01/17/2016 13:54   Dg Fluoro Guided Loc Of Needle/cath Tip For Spinal Inject Lt  01/17/2016  CLINICAL DATA:  Encephalitis. EXAM: DIAGNOSTIC LUMBAR PUNCTURE UNDER FLUOROSCOPIC GUIDANCE FLUOROSCOPY TIME:  Radiation Exposure Index (as provided by the fluoroscopic device): 17.0 mGy PROCEDURE: Informed consent was obtained from the patient prior to the procedure, including potential complications of headache, allergy, and pain. With the patient prone, the lower back was prepped with Betadine. 1% Lidocaine was used for local anesthesia. Lumbar puncture was performed at the L4-L5 level using a 22 gauge needle with return of clear CSF with an opening pressure of 35 cm water. Fourteen ml of CSF were obtained for laboratory studies. The patient tolerated the procedure well and there were no apparent complications. Hemostasis was achieved. IMPRESSION: Successful fluoroscopic guided lumbar puncture . Electronically Signed   By: Maisie Fus  Register   On: 01/17/2016 17:09      Bincy Varughese,AG-ACNP Pulmonary & Critical Care   Wells Guiles, M.D.  01/19/2016  Critical Care Attestation.  I have personally obtained a history, examined the patient, evaluated laboratory and imaging results, formulated the assessment and plan and placed orders. The Patient requires high complexity decision making for assessment and support, frequent evaluation and titration of therapies, application of advanced monitoring technologies and extensive interpretation of multiple databases. The patient has critical illness that could lead imminently to failure of 1 or more organ systems and requires the highest level of physician preparedness to intervene.  Critical Care Time devoted to patient care services described in this note is 60 minutes and is exclusive of time spent in  procedures.

## 2016-01-19 NOTE — Progress Notes (Signed)
Pharmacy Antibiotic Note  Duane MARVEL Sr. is a 45 y.o. male admitted on 01/12/2016 with encephalitis, r/o meningitis, suspected aspiration PNA, and possible rabies exposure/zoonosis.  Pharmacy has been consulted for Unasyn, vancomycin, acyclovir, gentamicin, and Cipro dosing. Patient is also on doxycycline.   Plan: Will continue Unasyn 3 g iv q 6 hours.   SCr continues to improve. Random vancomycin level 4/26 was 14 mcg/ml. Will continue vancomycin 1500 mg iv q 24 hours for now and plan on checking a trough at 0630 4/28.   Acyclovir 10 mg/kg iv q 12 hours.   Cipro 400 mg iv q 12 hours.   Gentamicin 5 mg/kg iv once ordered. Will check a gentamicin level 10 hours after dose and order subsequent dosing per [place] nomogram.     Height: 6\' 1"  (185.4 cm) Weight: 290 lb 12.6 oz (131.9 kg) IBW/kg (Calculated) : 79.9  Temp (24hrs), Avg:101.4 F (38.6 C), Min:97.1 F (36.2 C), Max:103.3 F (39.6 C)   Recent Labs Lab 01/13/16 0601 01/14/16 0455  01/14/16 1643  01/16/16 0237 01/16/16 0514 01/17/16 0451 01/18/16 0524 01/18/16 0526 01/19/16 0423 01/19/16 1115  WBC 13.5* 11.9*  --   --   --   --  18.1* 15.7* 14.8*  --   --   --   CREATININE 1.14  --   < >  --   < > 4.33*  --  3.46* 2.92*  --  2.96* 2.68*  VANCOTROUGH  --   --   --  44*  --   --   --   --   --   --   --   --   VANCORANDOM  --   --   --   --   < >  --  12 14  --  14  --   --   < > = values in this interval not displayed.  Estimated Creatinine Clearance: 50.1 mL/min (by C-G formula based on Cr of 2.68).    No Known Allergies  Antimicrobials this admission: Zosyn 4/21 >> 4/24 Meropenem 4/25 >>4/26 Doxycycline 4/24 >> Unasyn 4/26 >> vancomycin 4/21 >>  Acyclovir 4/27 >> Cipro 4/27 >> Gentamicin 4/27 >>  Dose adjustments this admission: Vancomycin changed from 1250 mg iv q 8 h to 1500 mg iv q 24 hours   Microbiology results: 4/26 BAL: NGTD 4/25 CSF cx: NGTD 4/24 TA: MSSA 4/20 BCx: negative 4/22 BCx:  negative 4/24 BCx: NGTD 4/24 UCx: negative 4/22 UCx: negative  4/22 TA: MSSA 4/20 MRSA PCR: positive  Thank you for allowing pharmacy to be a part of this patient's care.  Valentina Gu 01/19/2016 2:34 PM

## 2016-01-19 NOTE — Progress Notes (Signed)
Subjective: Remains intubated and sedated.  No further seizure-like activity noted.    Objective: Current vital signs: BP 155/87 mmHg  Pulse 97  Temp(Src) 100.3 F (37.9 C) (Rectal)  Resp 36  Ht 6' 1"  (1.854 m)  Wt 131.9 kg (290 lb 12.6 oz)  BMI 38.37 kg/m2  SpO2 99% Vital signs in last 24 hours: Temp:  [98.3 F (36.8 C)-103.3 F (39.6 C)] 100.3 F (37.9 C) (04/27 0945) Pulse Rate:  [76-110] 97 (04/27 0945) Resp:  [9-37] 36 (04/27 0945) BP: (111-179)/(71-102) 155/87 mmHg (04/27 0945) SpO2:  [74 %-100 %] 99 % (04/27 0945) FiO2 (%):  [40 %-50 %] 40 % (04/27 0756) Weight:  [131.9 kg (290 lb 12.6 oz)] 131.9 kg (290 lb 12.6 oz) (04/27 0435)  Intake/Output from previous day: 04/26 0701 - 04/27 0700 In: 5337.5 [I.V.:2959; NG/GT:893.5; IV Piggyback:1485] Out: 3400 [Urine:3400] Intake/Output this shift: Total I/O In: 736 [I.V.:286; NG/GT:100; IV Piggyback:350] Out: -  Nutritional status: Diet NPO time specified Except for: Sips with Meds  Neurologic Exam: Mental Status: Patient does not respond to verbal stimuli. Does not respond to deep sternal rub. Does not follow commands. No verbalizations noted.  Cranial Nerves: II: patient does not respond confrontation bilaterally, pupils pinpoint and unreactive bilaterally III,IV,VI: doll's response absent bilaterally.  V,VII: corneal reflex absent bilaterally VIII: patient does not respond to verbal stimuli IX,X: gag reflex reduced, XI: trapezius strength unable to test bilaterally XII: tongue strength unable to test Motor: Extremities flaccid throughout. No spontaneous movement noted. No purposeful movements noted. Sensory: Does not respond to noxious stimuli in any extremity  Lab Results: Basic Metabolic Panel:  Recent Labs Lab 01/13/16 0601 01/14/16 0610 01/15/16 0559 01/16/16 0237 01/16/16 0514 01/17/16 0451 01/18/16 0524 01/19/16 0423  NA 143 142  --  142  --  144 142  --   K 3.8 4.5  --  4.2  --  4.8 5.5*   --   CL 114* 110  --  115*  --  116* 115*  --   CO2 24 23  --  19*  --  23 22  --   GLUCOSE 162* 170*  --  277*  --  137* 113*  --   BUN 17 39*  --  70*  --  71* 68*  --   CREATININE 1.14 4.01* 5.17* 4.33*  --  3.46* 2.92* 2.96*  CALCIUM 8.8* 8.2*  --  7.8*  --  8.2* 8.3*  --   MG  --   --   --   --  2.2  --   --   --   PHOS  --   --   --   --  2.3*  --   --   --     Liver Function Tests:  Recent Labs Lab 01/13/16 0601 01/16/16 0514 01/17/16 0451  AST 25 407* 176*  ALT 20 1258* 891*  ALKPHOS 61 60 59  BILITOT 0.7 0.8 0.6  PROT 6.4* 5.6* 5.9*  ALBUMIN 3.4* 2.3* 2.2*    Recent Labs Lab 01/15/16 0559  LIPASE 29   No results for input(s): AMMONIA in the last 168 hours.  CBC:  Recent Labs Lab 01/13/16 0601 01/14/16 0455 01/16/16 0514 01/17/16 0451 01/18/16 0524  WBC 13.5* 11.9* 18.1* 15.7* 14.8*  NEUTROABS  --   --  16.0*  --   --   HGB 13.0 13.2 11.6* 10.6* 10.2*  HCT 39.2* 39.6* 35.2* 32.3* 30.8*  MCV 92.2 92.6 92.3 94.2 93.3  PLT 213 75* 52* 70* 94*    Cardiac Enzymes:  Recent Labs Lab 01/12/16 1319 01/12/16 1814  TROPONINI 0.06* 0.10*    Lipid Panel:  Recent Labs Lab 01/13/16 1809 01/15/16 0559  TRIG 181* 203*    CBG:  Recent Labs Lab 01/18/16 1615 01/18/16 1955 01/19/16 0003 01/19/16 0348 01/19/16 0718  GLUCAP 154* 150* 138* 150* 186*    Microbiology: Results for orders placed or performed during the hospital encounter of 01/12/16  MRSA PCR Screening     Status: Abnormal   Collection Time: 01/12/16  6:01 AM  Result Value Ref Range Status   MRSA by PCR POSITIVE (A) NEGATIVE Final    Comment:        The GeneXpert MRSA Assay (FDA approved for NASAL specimens only), is one component of a comprehensive MRSA colonization surveillance program. It is not intended to diagnose MRSA infection nor to guide or monitor treatment for MRSA infections. CRITICAL RESULT CALLED TO, READ BACK BY AND VERIFIED WITH: PAM MYERS 01/12/16 0751  SGD   Culture, blood (Routine X 2) w Reflex to ID Panel     Status: None   Collection Time: 01/12/16  4:54 PM  Result Value Ref Range Status   Specimen Description BLOOD LEFT HAND  Final   Special Requests BOTTLES DRAWN AEROBIC AND ANAEROBIC [place]  Final   Culture NO GROWTH 5 DAYS  Final   Report Status 01/17/2016 FINAL  Final  Culture, blood (Routine X 2) w Reflex to ID Panel     Status: None   Collection Time: 01/12/16  5:04 PM  Result Value Ref Range Status   Specimen Description BLOOD LEFT ARM  Final   Special Requests   Final    BOTTLES DRAWN AEROBIC AND ANAEROBIC  AERO 10CC ANA 5CC   Culture NO GROWTH 5 DAYS  Final   Report Status 01/17/2016 FINAL  Final  Culture, expectorated sputum-assessment     Status: None   Collection Time: 01/14/16  4:26 AM  Result Value Ref Range Status   Specimen Description TRACHEAL ASPIRATE  Final   Special Requests NONE  Final   Sputum evaluation THIS SPECIMEN IS ACCEPTABLE FOR SPUTUM CULTURE  Final   Report Status 01/14/2016 FINAL  Final  Culture, respiratory (NON-Expectorated)     Status: None   Collection Time: 01/14/16  4:26 AM  Result Value Ref Range Status   Specimen Description TRACHEAL ASPIRATE  Final   Special Requests NONE Reflexed from [id number]  Final   Gram Stain FEW WBC SEEN FEW GRAM POSITIVE COCCI   Final   Culture MODERATE GROWTH STAPHYLOCOCCUS AUREUS  Final   Report Status 01/16/2016 FINAL  Final   Organism ID, Bacteria STAPHYLOCOCCUS AUREUS  Final      Susceptibility   Staphylococcus aureus - MIC*    CIPROFLOXACIN <=0.5 SENSITIVE Sensitive     ERYTHROMYCIN 2 RESISTANT Resistant     GENTAMICIN <=0.5 SENSITIVE Sensitive     OXACILLIN 0.5 SENSITIVE Sensitive     TETRACYCLINE <=1 SENSITIVE Sensitive     VANCOMYCIN 1 SENSITIVE Sensitive     TRIMETH/SULFA <=10 SENSITIVE Sensitive     CLINDAMYCIN <=0.25 RESISTANT Resistant     RIFAMPIN <=0.5 SENSITIVE Sensitive     Inducible Clindamycin Value in next row Resistant       POSITIVEINDUCIBLE CLINDAMYCIN RESISTANCE - A positive ICR test is indicative of inducible resistance to macrolides, lincosamides, and type B streptogramin.  This isolate is presumed to be resistant to Clindamycin, however, Clindamycin may  still be effective in some patients.     * MODERATE GROWTH STAPHYLOCOCCUS AUREUS  Culture, blood (Routine X 2) w Reflex to ID Panel     Status: None   Collection Time: 01/14/16  4:46 AM  Result Value Ref Range Status   Specimen Description BLOOD LEFT ASSIST CONTROL  Final   Special Requests   Final    BOTTLES DRAWN AEROBIC AND ANAEROBIC 15CCAERO,12CCANA   Culture NO GROWTH 5 DAYS  Final   Report Status 01/19/2016 FINAL  Final  Culture, blood (Routine X 2) w Reflex to ID Panel     Status: None (Preliminary result)   Collection Time: 01/14/16  5:06 AM  Result Value Ref Range Status   Specimen Description BLOOD RIGHT ASSIST CONTROL  Final   Special Requests   Final    BOTTLES DRAWN AEROBIC AND ANAEROBIC 12CCAERO,12CCANA   Culture NO GROWTH 4 DAYS  Final   Report Status PENDING  Incomplete  Urine culture     Status: None   Collection Time: 01/14/16  6:28 AM  Result Value Ref Range Status   Specimen Description URINE, RANDOM  Final   Special Requests NONE  Final   Culture NO GROWTH 1 DAY  Final   Report Status 01/15/2016 FINAL  Final  Culture, blood (Routine X 2) w Reflex to ID Panel     Status: None (Preliminary result)   Collection Time: 01/16/16  8:03 AM  Result Value Ref Range Status   Specimen Description BLOOD RIGHT HAND  Final   Special Requests   Final    BOTTLES DRAWN AEROBIC AND ANAEROBIC AER 1ML ANA .5ML   Culture NO GROWTH 2 DAYS  Final   Report Status PENDING  Incomplete  Culture, blood (Routine X 2) w Reflex to ID Panel     Status: None (Preliminary result)   Collection Time: 01/16/16  8:04 AM  Result Value Ref Range Status   Specimen Description BLOOD LEFT HAND  Final   Special Requests   Final    BOTTLES DRAWN AEROBIC AND ANAEROBIC  AER 1ML ANA .5ML   Culture NO GROWTH 2 DAYS  Final   Report Status PENDING  Incomplete  Urine culture     Status: None   Collection Time: 01/16/16  8:58 AM  Result Value Ref Range Status   Specimen Description URINE, RANDOM  Final   Special Requests NONE  Final   Culture NO GROWTH 1 DAY  Final   Report Status 01/17/2016 FINAL  Final  Culture, expectorated sputum-assessment     Status: None   Collection Time: 01/16/16 12:10 PM  Result Value Ref Range Status   Specimen Description TRACHEAL ASPIRATE  Final   Special Requests NONE  Final   Sputum evaluation THIS SPECIMEN IS ACCEPTABLE FOR SPUTUM CULTURE  Final   Report Status 01/16/2016 FINAL  Final  Culture, respiratory (NON-Expectorated)     Status: None (Preliminary result)   Collection Time: 01/16/16 12:10 PM  Result Value Ref Range Status   Specimen Description TRACHEAL ASPIRATE  Final   Special Requests NONE Reflexed from [id number]  Final   Gram Stain RARE WBC SEEN NO ORGANISMS SEEN   Final   Culture LIGHT GROWTH STAPHYLOCOCCUS AUREUS  Final   Report Status PENDING  Incomplete   Organism ID, Bacteria STAPHYLOCOCCUS AUREUS  Final      Susceptibility   Staphylococcus aureus - MIC*    CIPROFLOXACIN <=0.5 SENSITIVE Sensitive     ERYTHROMYCIN 1 RESISTANT Resistant  GENTAMICIN <=0.5 SENSITIVE Sensitive     OXACILLIN 0.5 SENSITIVE Sensitive     TETRACYCLINE <=1 SENSITIVE Sensitive     VANCOMYCIN 1 SENSITIVE Sensitive     TRIMETH/SULFA <=10 SENSITIVE Sensitive     CLINDAMYCIN <=0.25 RESISTANT Resistant     RIFAMPIN <=0.5 SENSITIVE Sensitive     Inducible Clindamycin Value in next row Resistant      POSITIVEINDUCIBLE CLINDAMYCIN RESISTANCE - A positive ICR test is indicative of inducible resistance to macrolides, lincosamides, and type B streptogramin.  This isolate is presumed to be resistant to Clindamycin, however, Clindamycin may still be effective in some patients.     * LIGHT GROWTH STAPHYLOCOCCUS AUREUS  CSF culture      Status: None (Preliminary result)   Collection Time: 01/17/16  4:22 PM  Result Value Ref Range Status   Specimen Description CSF  Final   Special Requests NONE  Final   Gram Stain NO ORGANISMS SEEN RARE WBC SEEN   Final   Culture NO GROWTH < 24 HOURS  Final   Report Status PENDING  Incomplete  Culture, bal-quantitative     Status: None (Preliminary result)   Collection Time: 01/18/16 12:33 PM  Result Value Ref Range Status   Specimen Description BRONCHIAL WASHINGS  Final   Special Requests Normal  Final   Gram Stain PENDING  Incomplete   Culture NO GROWTH < 24 HOURS  Final   Report Status PENDING  Incomplete    Coagulation Studies:  Recent Labs  01/17/16 1036  LABPROT 15.7*  INR 1.23    Imaging: Dg Chest 1 View  01/18/2016  CLINICAL DATA:  Shortness of breath. EXAM: CHEST 1 VIEW COMPARISON:  01/17/2016. FINDINGS: Endotracheal tube NG tube, and right IJ line stable position. Stable cardiomegaly. Interim partial clearing of right lower lobe infiltrate and/or pulmonary edema. Small residual right pleural effusion. Left lower lobe infiltrate/edema has cleared. Left pleural effusion has cleared. These findings are consistent with improving congestive heart failure. IMPRESSION: 1.  Lines and tubes in stable position. 2. Stable cardiomegaly. Interim partial clearing of right lower lobe infiltrate/pulmonary edema. Near complete clearing of right pleural effusion. Left lower lobe infiltrate and/or pulmonary edema and left pleural effusion has cleared. These findings are consistent with improving congestive heart failure. Electronically Signed   By: Marcello Moores  Register   On: 01/18/2016 07:26   Dg Chest 1 View  01/17/2016  CLINICAL DATA:  Respiratory failure, unresponsive. EXAM: CHEST 1 VIEW COMPARISON:  January 17, 2016 FINDINGS: The heart size is mildly enlarged. Endotracheal tube is identified in good position unchanged compared to prior exam. The endotracheal tube tip is 5 cm from carina. A  right central venous line is unchanged. There are patchy opacities of bilateral lung bases. The bones are normal. IMPRESSION: Patchy opacity of bilateral lung bases at least in part due to atelectasis but superimposed pneumonia is not excluded. Bilateral pleural effusions. Electronically Signed   By: Abelardo Diesel M.D.   On: 01/17/2016 13:54   US Abdomen Limited Ruq  01/17/2016  CLINICAL DATA:  Sepsis.  Ventilated patient. EXAM: US ABDOMEN LIMITED - RIGHT UPPER QUADRANT COMPARISON:  CT 01/16/2016 and previous FINDINGS: Gallbladder: No gallstones or wall thickening visualized. No sonographic Murphy sign noted by sonographer. Only supine images could be obtained. Common bile duct: Diameter: 4.1 mm Liver: No focal lesion identified. Within normal limits in parenchymal echogenicity. IMPRESSION: Negative.  Normal gallbladder. Electronically Signed   By: Lucrezia Europe M.D.   On: 01/17/2016 12:06  Dg Fluoro Guided Loc Of Needle/cath Tip For Spinal Inject Lt  01/17/2016  CLINICAL DATA:  Encephalitis. EXAM: DIAGNOSTIC LUMBAR PUNCTURE UNDER FLUOROSCOPIC GUIDANCE FLUOROSCOPY TIME:  Radiation Exposure Index (as provided by the fluoroscopic device): 17.0 mGy PROCEDURE: Informed consent was obtained from the patient prior to the procedure, including potential complications of headache, allergy, and pain. With the patient prone, the lower back was prepped with Betadine. 1% Lidocaine was used for local anesthesia. Lumbar puncture was performed at the L4-L5 level using a 22 gauge needle with return of clear CSF with an opening pressure of 35 cm water. Fourteen ml of CSF were obtained for laboratory studies. The patient tolerated the procedure well and there were no apparent complications. Hemostasis was achieved. IMPRESSION: Successful fluoroscopic guided lumbar puncture . Electronically Signed   By: Marcello Moores  Register   On: 01/17/2016 17:09    Medications:  I have reviewed the patient's current medications. Scheduled: .  sodium chloride   Intravenous Once  . acyclovir  1,000 mg Intravenous Q12H  . ampicillin-sulbactam (UNASYN) IV  3 g Intravenous Q6H  . antiseptic oral rinse  7 mL Mouth Rinse QID  . aspirin  81 mg Oral Daily  . chlorhexidine gluconate (SAGE KIT)  15 mL Mouth Rinse BID  . ciprofloxacin  400 mg Intravenous Q12H  . cloNIDine  0.1 mg Transdermal Weekly  . diazepam  5 mg Per NG tube Q6H  . docusate  100 mg Per Tube BID  . doxycycline (VIBRAMYCIN) IV  100 mg Intravenous Q12H  . feeding supplement (PRO-STAT SUGAR FREE 64)  30 mL Per Tube TID  . free water  100 mL Per Tube Q8H  . gentamicin  5 mg/kg (Adjusted) Intravenous Once  . labetalol  20 mg Intravenous Q6H  . lacosamide (VIMPAT) IV  100 mg Intravenous Q12H  . pantoprazole (PROTONIX) IV  40 mg Intravenous Q24H  . sennosides  5 mL Oral BID  . vancomycin  1,500 mg Intravenous Q24H  . vecuronium  10 mg Intravenous Once    Assessment/Plan: Patient has continued to have fevers despite discontinuation of Dilantin.  Remains unresponsive.  CT only shows a lacunar infarct.  LP was unrevealing as well with no white cells and normal protein and glucose.  ID following patient and on broad spectrum coverage.  From a neurological standpoint the only other testing that may be helpful would be an MRI of the brain that can not be performed at this facility while he is intubated.  Transfer may need to be considered if medically reasonable.     LOS: 7 days   Alexis Goodell, MD Neurology [phone] 01/19/2016  11:23 AM

## 2016-01-19 NOTE — Progress Notes (Signed)
Pt. Increased work of breathing and rr, pt suctioned with no improvement.  Vecuronium given, will continue to monitor.

## 2016-01-19 NOTE — Progress Notes (Signed)
Remains sedated with propofol and fentanyl. Patient does not respond but when sedation is turned down patient becomes tachypnic with vent asynchrony therefore sedation continued.  Required PRN vecuronium once since propofol started.  NSR per cardiac monitor.  Patient has been afebrile since this morning and has not required cooling blanket.  Excellent urine output.  Wife and sister visited this shift and Dr. Nicholos Johns updated wife and spoke with her in person. Dr. Thad Ranger also spoke with wife.

## 2016-01-19 NOTE — Progress Notes (Signed)
Pharmacy Note - Constipation Prevention  Patient with BM 4/25. Patient is currently on the following bowel regimen:  Docusate 100 mg per tube BID Senna 8.6 mg BID Miralax daily prn  Will continue current regimen and f/u AM.  Valentina Gu, PharmD 2:33 PM

## 2016-01-19 NOTE — Progress Notes (Signed)
Chaplain rounded the unit and provided a compassionate presence and silent prayer with the patient who appeared to be sleeping.  Chaplain Thera Basden [phone] 

## 2016-01-19 NOTE — Progress Notes (Signed)
ID E note Patient continues with high spiking fevers, minimal responsiveness, some hemodynamic instability.  Tests negative HIV, RPR, ANA Micro - blood culture neg, sputum with MSSA   CSF - 0 wbc, 32 RBC, protein 51, glucose 102  Imaging CXR and CT chest with RLL pna.  CT abd pelvis - no etiology CT head - possible subacute lacunar infarct R thalamus  Tests pending CSF - HSV, enteroviral, arboviral, rabies, routine culture (added on cmv, vzv PCR) Rabies wu sent to [facility] Ordered leptospirosis antibody testing  Rec Remains on vanco, unasyn and doxy Add acyclovir even though CSF wbc is negative. I am also going to add gentamicin and a quinolone for systemic febrile illness with no obvious dx yet- will provide broader coverage of zoonosis given animal exposure.  Further testing ordered today -  I ordered repeat CMET and CBC  Patient would benefit from MRI- which cannot be done at [facility] Would try to transfer to [facility] or [facility] if possible to get MRI CSF VZV CMV PCR, serum crypto ag an csf ag. Serum leptospirosis ab

## 2016-01-20 LAB — CSF CULTURE: GRAM STAIN: NONE SEEN

## 2016-01-20 LAB — CSF CULTURE W GRAM STAIN: Culture: NO GROWTH

## 2016-01-20 LAB — CRYPTOCOCCUS ANTIGEN, SERUM: Cryptococcus Antigen, Serum: NEGATIVE

## 2016-01-21 LAB — CULTURE, BLOOD (ROUTINE X 2)
Culture: NO GROWTH
Culture: NO GROWTH

## 2016-01-21 LAB — CULTURE, BAL-QUANTITATIVE: CULTURE: NO GROWTH

## 2016-01-21 LAB — CULTURE, BAL-QUANTITATIVE W GRAM STAIN: Special Requests: NORMAL

## 2016-01-22 LAB — ENTEROVIRUS PCR: ENTEROVIRUS PCR: NEGATIVE

## 2016-01-23 LAB — WOUND CULTURE: Culture: NO GROWTH

## 2016-01-23 DEATH — deceased

## 2016-01-24 LAB — MISC LABCORP TEST (SEND OUT): Labcorp test code: 9985

## 2016-01-29 LAB — VARICELLA-ZOSTER BY PCR: Varicella-Zoster, PCR: NEGATIVE

## 2016-02-08 LAB — CULTURE, FUNGUS WITHOUT SMEAR: SPECIAL REQUESTS: NORMAL

## 2016-02-16 LAB — ACID FAST SMEAR (AFB): ACID FAST SMEAR - AFSCU2: NEGATIVE

## 2016-03-07 LAB — ACID FAST CULTURE WITH REFLEXED SENSITIVITIES (MYCOBACTERIA): Acid Fast Culture: NEGATIVE

## 2016-03-07 LAB — ACID FAST CULTURE WITH REFLEXED SENSITIVITIES

## 2016-09-04 IMAGING — DX DG CHEST 1V PORT
1 series · 1 of 1 positions shown · non-contrast
Comparison: 01/15/2016.

CLINICAL DATA: Renal failure.

EXAM:
PORTABLE CHEST 1 VIEW

[chest ap]
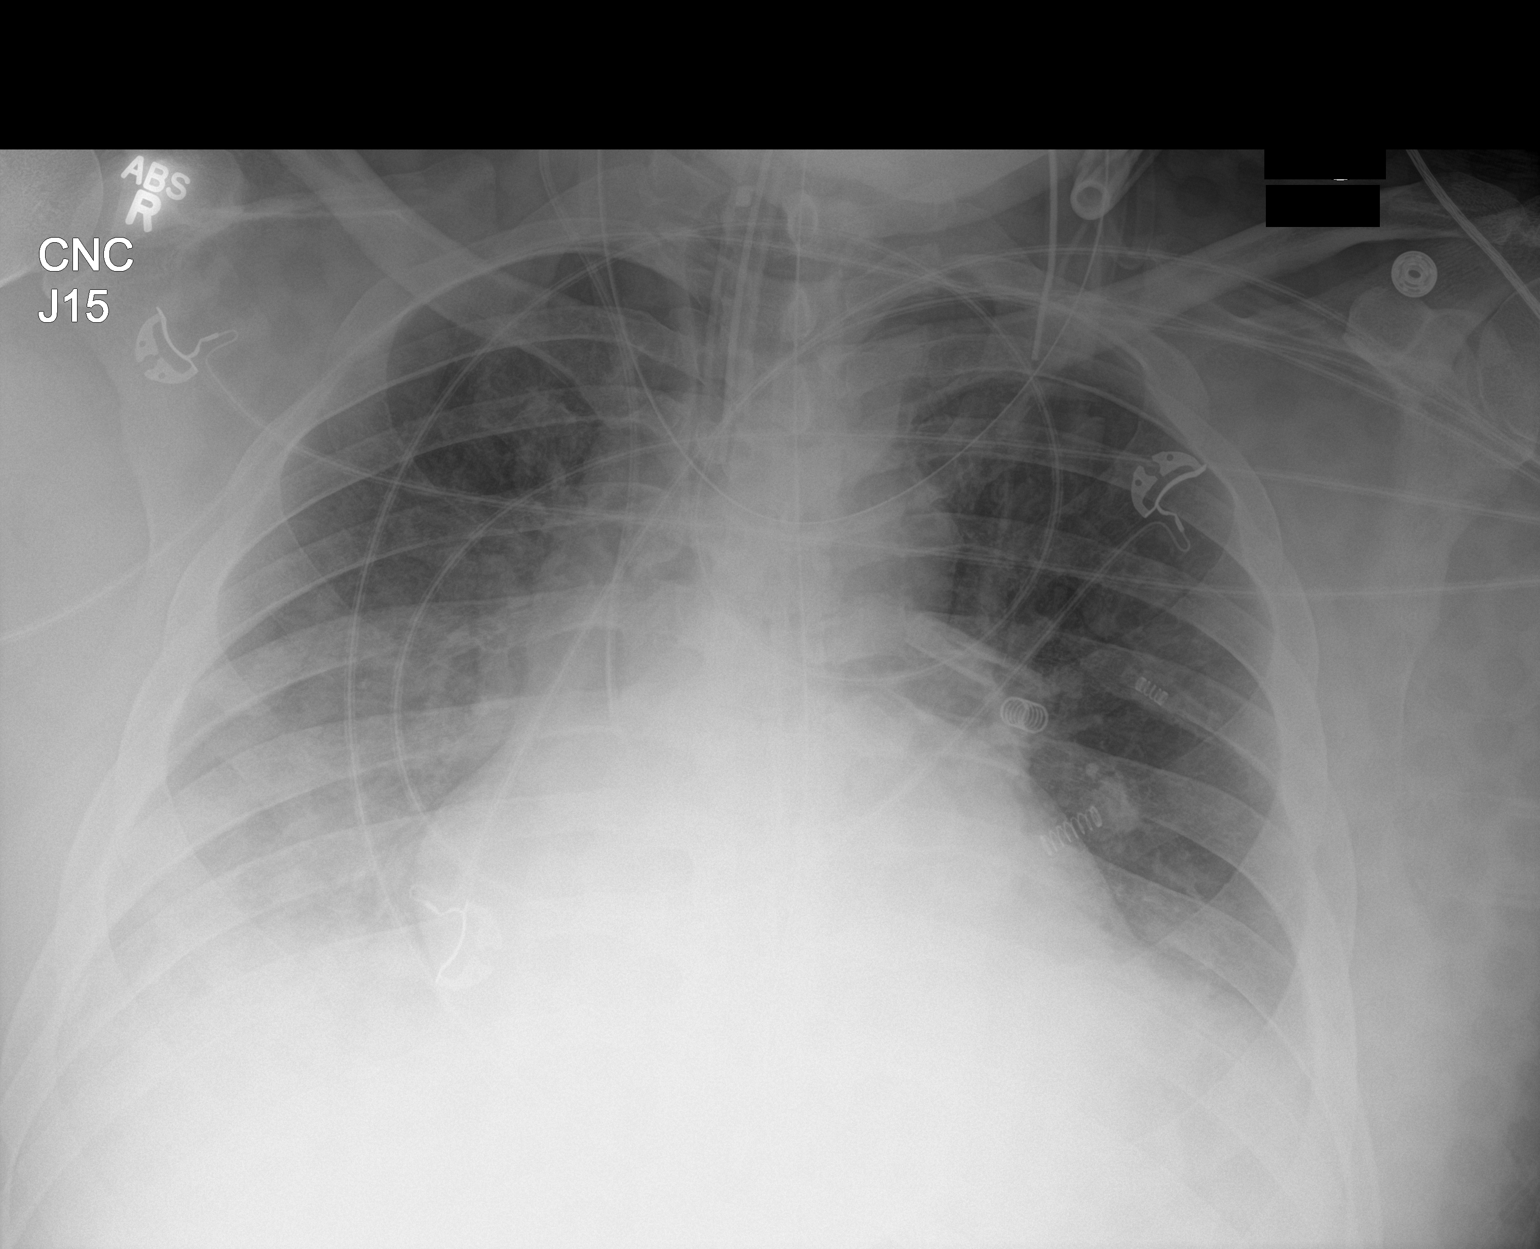

[1 of 1 positions shown; findings below may reference images not displayed]

FINDINGS: Endotracheal tube, NG tube, right IJ line stable position.
Cardiomegaly with bilateral pulmonary interstitial prominence and
spot small pleural effusions consistent congestive heart failure. No
pneumothorax .
IMPRESSION: 1. Lines and tubes in stable position.

2. Findings consistent with congestive heart failure with bilateral
pulmonary interstitial edema and bilateral small pleural effusions.

## 2016-09-06 IMAGING — DX DG CHEST 1V
1 series · 1 of 1 positions shown · non-contrast
Comparison: 01/17/2016.

CLINICAL DATA: Shortness of breath.

EXAM:
CHEST 1 VIEW

[chest ap]
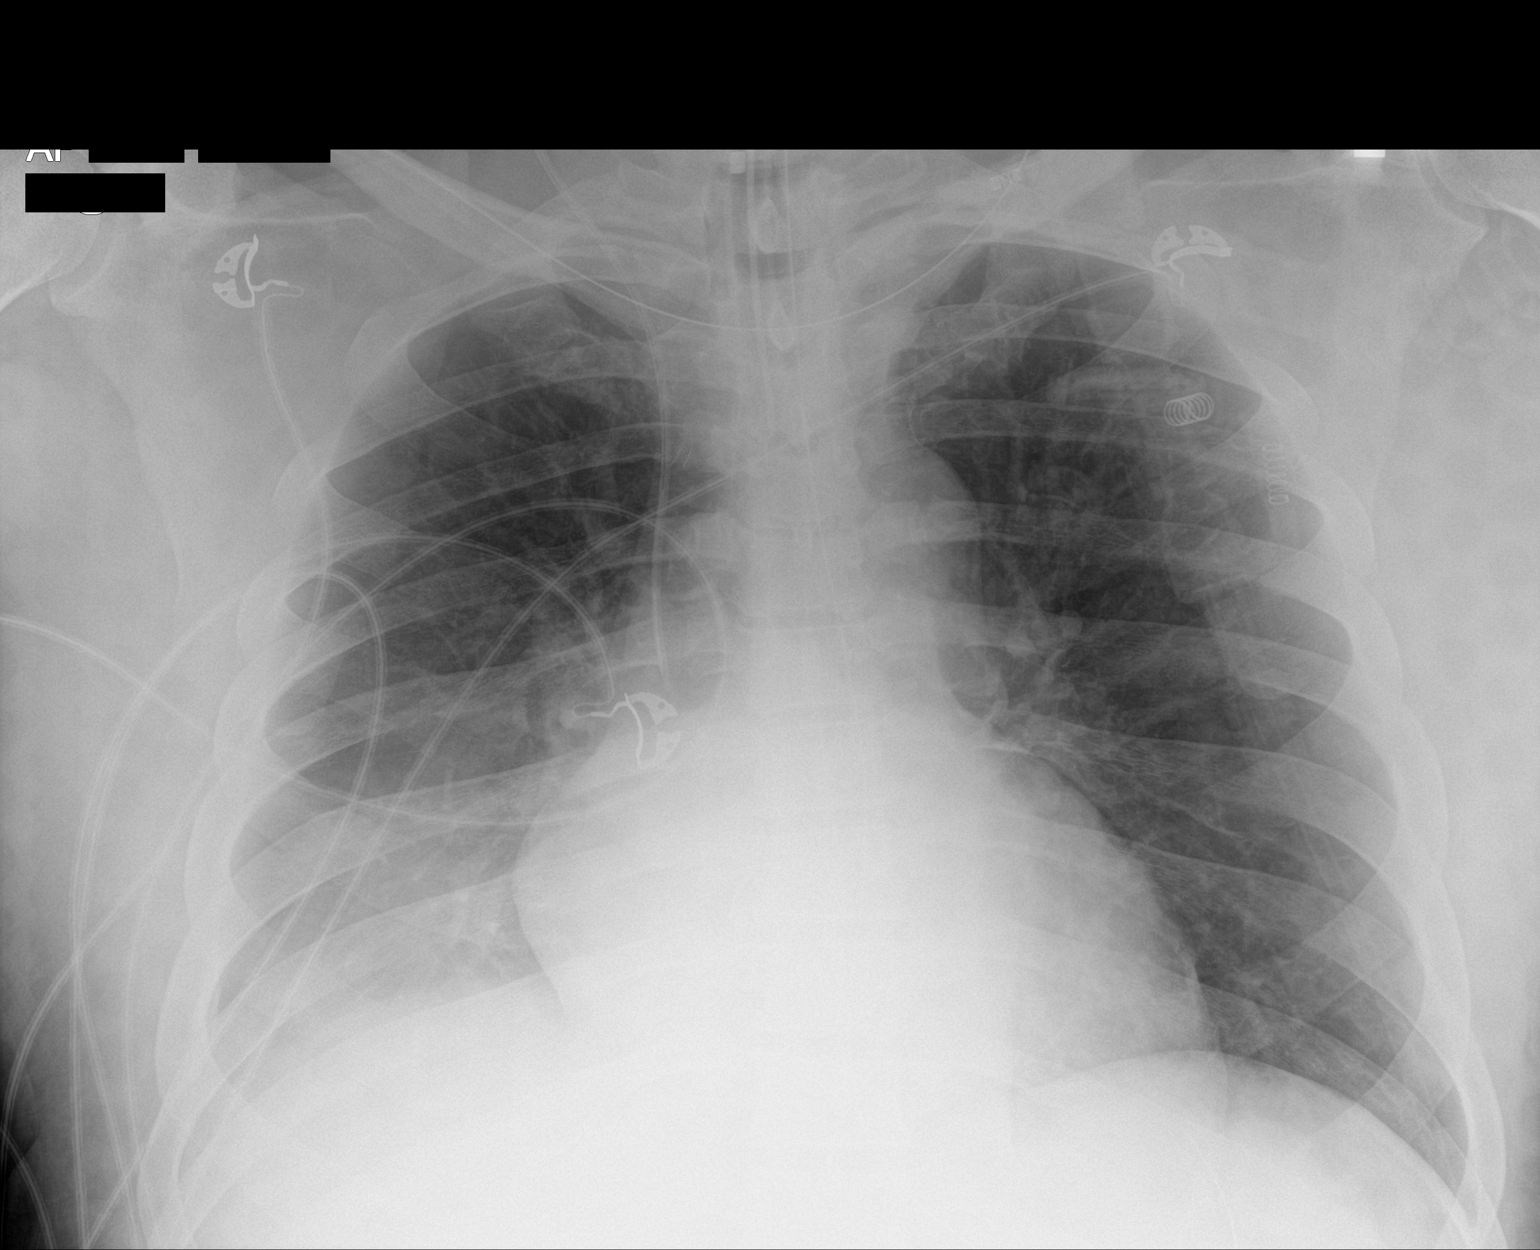

[1 of 1 positions shown; findings below may reference images not displayed]

FINDINGS: Endotracheal tube NG tube, and right IJ line stable position. Stable
cardiomegaly. Interim partial clearing of right lower lobe
infiltrate and/or pulmonary edema. Small residual right pleural
effusion. Left lower lobe infiltrate/edema has cleared. Left pleural
effusion has cleared. These findings are consistent with improving
congestive heart failure.
IMPRESSION: 1.  Lines and tubes in stable position.

2. Stable cardiomegaly. Interim partial clearing of right lower lobe
infiltrate/pulmonary edema. Near complete clearing of right pleural
effusion. Left lower lobe infiltrate and/or pulmonary edema and left
pleural effusion has cleared. These findings are consistent with
improving congestive heart failure.
